# Patient Record
Sex: Female | Born: 2000 | Hispanic: No | Marital: Single | Smoking: Never smoker
Health system: Southern US, Community
[De-identification: ages and names within clinical notes are randomized; demographics above are authoritative.]

## PROBLEM LIST (undated history)

## (undated) DIAGNOSIS — F329 Major depressive disorder, single episode, unspecified: Secondary | ICD-10-CM

## (undated) DIAGNOSIS — G43909 Migraine, unspecified, not intractable, without status migrainosus: Secondary | ICD-10-CM

## (undated) DIAGNOSIS — K589 Irritable bowel syndrome without diarrhea: Secondary | ICD-10-CM

## (undated) DIAGNOSIS — F32A Depression, unspecified: Secondary | ICD-10-CM

## (undated) DIAGNOSIS — F419 Anxiety disorder, unspecified: Secondary | ICD-10-CM

## (undated) DIAGNOSIS — J45909 Unspecified asthma, uncomplicated: Secondary | ICD-10-CM

## (undated) DIAGNOSIS — K219 Gastro-esophageal reflux disease without esophagitis: Secondary | ICD-10-CM

## (undated) HISTORY — PX: BONE MARROW BIOPSY: SHX199

---
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
## 2019-05-17 ENCOUNTER — Other Ambulatory Visit: Payer: Self-pay

## 2019-05-17 ENCOUNTER — Ambulatory Visit (HOSPITAL_COMMUNITY)
Admission: EM | Admit: 2019-05-17 | Discharge: 2019-05-17 | Disposition: A | Discharge status: Home / Self Care | Payer: Medicaid Other | Attending: Family Medicine | Admitting: Family Medicine

## 2019-05-17 ENCOUNTER — Encounter (HOSPITAL_COMMUNITY): Payer: Self-pay

## 2019-05-17 DIAGNOSIS — Z20828 Contact with and (suspected) exposure to other viral communicable diseases: Secondary | ICD-10-CM | POA: Diagnosis not present

## 2019-05-17 DIAGNOSIS — J029 Acute pharyngitis, unspecified: Secondary | ICD-10-CM | POA: Diagnosis present

## 2019-05-17 LAB — POCT RAPID STREP A: Streptococcus, Group A Screen (Direct): NEGATIVE

## 2019-05-17 MED ORDER — AMOXICILLIN 500 MG PO CAPS: 500.0000 mg | ORAL_CAPSULE | Freq: Two times a day (BID) | ORAL | 0 refills | Status: DC

## 2019-05-17 NOTE — Discharge Instructions (Signed)
If you start taking the antibiotics and develop a fever then it's likely you have mono. Please stop the antibiotic if that occurs.  Please alternate ibuprofen and Tylenol for fever. Please stay well-hydrated. We will call you with the results from today.

## 2019-05-17 NOTE — ED Triage Notes (Signed)
Pt presents with complaints of headache fever, sore throat and headache x 2 days.

## 2019-05-17 NOTE — ED Provider Notes (Signed)
[facility]    CSN: [id number] Arrival date & time: 05/17/19  1243      History   Chief Complaint Chief Complaint  Patient presents with  . Fever    HPI Alexandra Young is a 18 y.o. female.   She is presenting with fever, sore throat and headache.  She is symptoms started the past few days.  She measured her temperature to be 101 yesterday.  She has a history of strep pharyngitis.  She had COVID in June of this year.  She has not been around anyone with similar symptoms.  She has been in close proximity with other people.  Does not have any trouble swallowing.  Pain does get worse when she does swallow or speak.  Pain is becoming more constant nature.  HPI  History reviewed. No pertinent past medical history.  There are no active problems to display for this patient.   History reviewed. No pertinent surgical history.  OB History   No obstetric history on file.      Home Medications    Prior to Admission medications   Medication Sig Start Date End Date Taking? Authorizing Provider  cholecalciferol (VITAMIN D3) 25 MCG (1000 UT) tablet Take 1,000 Units by mouth daily.   Yes [provider]  linaclotide (LINZESS) 145 MCG CAPS capsule Take 145 mcg by mouth daily before breakfast.   Yes [provider]  polyethylene glycol (MIRALAX / GLYCOLAX) 17 g packet Take 17 g by mouth daily.   Yes [provider]  amoxicillin (AMOXIL) 500 MG capsule Take 1 capsule (500 mg total) by mouth 2 (two) times daily. 05/17/19   Myra RudeSchmitz, Jehu Mccauslin E, MD  cetirizine (ZYRTEC) 10 MG tablet Take 10 mg by mouth daily.    [provider]  cyclobenzaprine (FLEXERIL) 5 MG tablet Take 1 tablet (5 mg total) by mouth at bedtime. 10/29/18   Elvina SidleLauenstein, Kurt, MD  medroxyPROGESTERone (DEPO-PROVERA) 150 MG/ML injection Inject 150 mg into the muscle every 3 (three) months.    [provider]  naproxen (NAPROSYN) 500 MG tablet Take 1 tablet (500 mg total) by mouth 2  (two) times daily. 10/24/18   Carlyle BasquesHernandez, Ana P, PA-C  omeprazole (PRILOSEC) 10 MG capsule Take 10 mg by mouth daily.    [provider]  predniSONE (DELTASONE) 20 MG tablet Two daily with food 10/29/18   Elvina SidleLauenstein, Kurt, MD    Family History Family History  Problem Relation Age of Onset  . Asthma Mother   . Thyroid disease Mother     Social History Social History   Tobacco Use  . Smoking status: Never Smoker  . Smokeless tobacco: Never Used  Substance Use Topics  . Alcohol use: Never    Frequency: Never  . Drug use: Never     Allergies   Patient has no known allergies.   Review of Systems Review of Systems  Constitutional: Positive for fever.  HENT: Positive for sore throat.   Respiratory: Negative for cough.   Cardiovascular: Negative for chest pain.  Gastrointestinal: Negative for abdominal pain.  Musculoskeletal: Negative for back pain.  Skin: Negative for color change.  Neurological: Negative for weakness.  Hematological: Negative for adenopathy.  Psychiatric/Behavioral: Negative for agitation.     Physical Exam Triage Vital Signs ED Triage Vitals  Enc Vitals Group     BP 05/17/19 1314 122/70     Pulse Rate 05/17/19 1314 (!) 145     Resp 05/17/19 1314 20     Temp  05/17/19 1314 100 F (37.8 C)     Temp Source 05/17/19 1314 Tympanic     SpO2 05/17/19 1314 98 %     Weight --      Height --      Head Circumference --      Peak Flow --      Pain Score 05/17/19 1315 8     Pain Loc --      Pain Edu? --      Excl. in [place]? --    No data found.  Updated Vital Signs BP 122/70   Pulse (!) 130   Temp 100 F (37.8 C) (Tympanic)   Resp 20   SpO2 98%   Visual Acuity Right Eye Distance:   Left Eye Distance:   Bilateral Distance:    Right Eye Near:   Left Eye Near:    Bilateral Near:     Physical Exam Gen: NAD, alert, cooperative with exam, ENT: normal lips, normal nasal mucosa, tympanic membranes clear and intact bilaterally, enlarged  tonsils bilaterally with exudates. Eye: normal EOM, normal conjunctiva and lids CV:  no edema, +2 pedal pulses, tachycardic, regular rhythm, S1-S2   Resp: no accessory muscle use, non-labored, clear to auscultation bilaterally, no crackles or wheezes Skin: no rashes, no areas of induration  Neuro: normal tone, normal sensation to touch Psych:  normal insight, alert and oriented MSK: Normal gait, normal strength    UC Treatments / Results  Labs (all labs ordered are listed, but only abnormal results are displayed) Labs Reviewed  NOVEL CORONAVIRUS, NAA (HOSP ORDER, SEND-OUT TO REF LAB; TAT 18-24 HRS)  CULTURE, GROUP A STREP [facility])  POCT RAPID STREP A    EKG   Radiology No results found.  Procedures Procedures (including critical care time)  Medications Ordered in UC Medications - No data to display  Initial Impression / Assessment and Plan / UC Course  I have reviewed the triage vital signs and the nursing notes.  Pertinent labs & imaging results that were available during my care of the patient were reviewed by me and considered in my medical decision making (see chart for details).     Alexandra Young is a 18 year old female that is presenting with sore throat.  Has tonsillar enlargement and exudates on exam but rapid strep was negative.  Culture was sent.  Possible for mononucleosis.  Cover test was also sent.  She was positive in June.  Provide amoxicillin and counseled on its use if she develops a rash.  Provided a work note.  Counseled on supportive care.  Given indications to follow-up and return.  Final Clinical Impressions(s) / UC Diagnoses   Final diagnoses:  Sore throat     Discharge Instructions     If you start taking the antibiotics and develop a fever then it's likely you have mono. Please stop the antibiotic if that occurs.  Please alternate ibuprofen and Tylenol for fever. Please stay well-hydrated. We will call you with the results from today.    ED  Prescriptions    Medication Sig Dispense Auth. Provider   amoxicillin (AMOXIL) 500 MG capsule Take 1 capsule (500 mg total) by mouth 2 (two) times daily. 20 capsule Rosemarie Ax, MD     Controlled Substance Prescriptions [place] Controlled Substance Registry consulted? Not Applicable   Rosemarie Ax, MD 05/17/19 1435

## 2019-05-18 LAB — CULTURE, GROUP A STREP (THRC)

## 2019-05-18 LAB — NOVEL CORONAVIRUS, NAA (HOSP ORDER, SEND-OUT TO REF LAB; TAT 18-24 HRS): SARS-CoV-2, NAA: NOT DETECTED

## 2019-05-20 ENCOUNTER — Encounter (HOSPITAL_COMMUNITY): Payer: Self-pay

## 2019-06-20 ENCOUNTER — Other Ambulatory Visit: Payer: Self-pay

## 2019-06-20 ENCOUNTER — Encounter (HOSPITAL_COMMUNITY): Payer: Self-pay

## 2019-06-20 ENCOUNTER — Ambulatory Visit (HOSPITAL_COMMUNITY)
Admission: EM | Admit: 2019-06-20 | Discharge: 2019-06-20 | Disposition: A | Discharge status: Home / Self Care | Payer: Medicaid Other | Attending: Family Medicine | Admitting: Family Medicine

## 2019-06-20 DIAGNOSIS — G43819 Other migraine, intractable, without status migrainosus: Secondary | ICD-10-CM

## 2019-06-20 MED ORDER — KETOROLAC TROMETHAMINE 60 MG/2ML IM SOLN
INTRAMUSCULAR | Status: AC
  Filled 2019-06-20: qty 2

## 2019-06-20 MED ORDER — METOCLOPRAMIDE HCL 5 MG/ML IJ SOLN
5.0000 mg | Freq: Once | INTRAMUSCULAR | Status: AC
  Administered 2019-06-20: 17:00:00 5 mg via INTRAMUSCULAR

## 2019-06-20 MED ORDER — DEXAMETHASONE SODIUM PHOSPHATE 10 MG/ML IJ SOLN
10.0000 mg | Freq: Once | INTRAMUSCULAR | Status: AC
  Administered 2019-06-20: 17:00:00 10 mg via INTRAMUSCULAR

## 2019-06-20 MED ORDER — METOCLOPRAMIDE HCL 5 MG/ML IJ SOLN
INTRAMUSCULAR | Status: AC
  Filled 2019-06-20: qty 2

## 2019-06-20 MED ORDER — KETOROLAC TROMETHAMINE 60 MG/2ML IM SOLN
60.0000 mg | Freq: Once | INTRAMUSCULAR | Status: AC
  Administered 2019-06-20: 17:00:00 60 mg via INTRAMUSCULAR

## 2019-06-20 MED ORDER — DEXAMETHASONE SODIUM PHOSPHATE 10 MG/ML IJ SOLN
INTRAMUSCULAR | Status: AC
  Filled 2019-06-20: qty 1

## 2019-06-20 NOTE — ED Provider Notes (Signed)
[facility]   [id number] 06/20/19 Arrival Time: 1557  ASSESSMENT & PLAN:  1. Other migraine without status migrainosus, intractable     Given: Meds ordered this encounter  Medications  . ketorolac (TORADOL) injection 60 mg  . metoCLOPramide (REGLAN) injection 5 mg  . dexamethasone (DECADRON) injection 10 mg    Normal neurological exam. Afebrile without nuchal rigidity. Current presentation and symptoms are consistent with prior migraines. No indication for neurodiagnostic workup at this time. Discussed.  Recommend: Follow-up Information    [facility].   Specialty: Urgent Care Why: If worsening or failing to improve as anticipated. Contact information: [address] [place] [address] [phone]          Reviewed expectations re: course of current medical issues. Questions answered. Outlined signs and symptoms indicating need for more acute intervention. Patient verbalized understanding. After Visit Summary given.   SUBJECTIVE:  Cristie Mckinney is a 18 y.o. female who presents with complaint of a migraine headache. Onset gradual, yesterday. Location: occipital that radiates frontally. History of headaches: yes with the same symptoms. Precipitating factors include: none which have been determined. Associated symptoms: Preceding aura: no. Nausea/vomiting: no. Vision changes: no. Increased sensitivity to light and to noises: mild. Fever: no. Sinus pressure/congestion: no. Extremity weakness: no. Home treatment has included Duexis with some improvement. Current headache has imited normal daily activities; called out of work today. Denies dizziness, loss of balance, muscle weakness, numbness of extremities, speech difficulties and vision problems. No head injury reported. Ambulatory without difficulty. No recent travel. No head injury reported.  ROS: As per HPI. All other systems negative.     OBJECTIVE:  Vitals:   06/20/19 1611  BP: 117/68  Pulse: 85  Resp: 15  Temp: 98.7 F (37.1 C)  TempSrc: Oral  SpO2: 99%    General appearance: alert; NAD but appears fatigued HENT: normocephalic; atraumatic Eyes: PERRLA; EOMI; conjunctivae normal Neck: supple with FROM Lungs: clear to auscultation bilaterally; unlabored respirations Heart: regular rate and rhythm Extremities: no edema; symmetrical with no gross deformities Skin: warm and dry Neurologic: alert; clear speech; CN 2-12 grossly intact; no facial droop; normal gait; normal symmetric reflexes; normal extremity strength and sensation throughout; bilateral upper and lower extremity sensation is grossly intact with 5/5 symmetric strength; normal grip strength Psychological: alert and cooperative; normal mood and affect  No Known Allergies   PMH: Sinusitis. Migraines.  Social History   Socioeconomic History  . Marital status: Single    Spouse name: Not on file  . Number of children: Not on file  . Years of education: Not on file  . Highest education level: Not on file  Occupational History  . Not on file  Social Needs  . Financial resource strain: Not on file  . Food insecurity    Worry: Not on file    Inability: Not on file  . Transportation needs    Medical: Not on file    Non-medical: Not on file  Tobacco Use  . Smoking status: Never Smoker  . Smokeless tobacco: Never Used  Substance and Sexual Activity  . Alcohol use: Never    Frequency: Never  . Drug use: Never  . Sexual activity: Yes    Birth control/protection: Condom  Lifestyle  . Physical activity    Days per week: Not on file    Minutes per session: Not on file  . Stress: Not on file  Relationships  . Social connections  Talks on phone: Not on file    Gets together: Not on file    Attends religious service: Not on file    Active member of club or organization: Not on file    Attends meetings of clubs or organizations: Not on file     Relationship status: Not on file  . Intimate partner violence    Fear of current or ex partner: Not on file    Emotionally abused: Not on file    Physically abused: Not on file    Forced sexual activity: Not on file  Other Topics Concern  . Not on file  Social History Narrative  . Not on file   Family History  Problem Relation Age of Onset  . Asthma Mother   . Thyroid disease Mother    History reviewed. No pertinent surgical history.   Vanessa Kick, MD 06/20/19 [phone]

## 2019-06-20 NOTE — ED Triage Notes (Signed)
Pt reports nausea and migraine x 1 day. Pt took  Duexis and improved her symptoms.

## 2019-06-20 NOTE — Discharge Instructions (Signed)
Meds ordered this encounter  Medications   ketorolac (TORADOL) injection 60 mg   metoCLOPramide (REGLAN) injection 5 mg   dexamethasone (DECADRON) injection 10 mg
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
## 2019-09-14 ENCOUNTER — Encounter (HOSPITAL_COMMUNITY): Payer: Self-pay

## 2019-09-14 ENCOUNTER — Other Ambulatory Visit: Payer: Self-pay

## 2019-09-14 ENCOUNTER — Ambulatory Visit (HOSPITAL_COMMUNITY)
Admission: EM | Admit: 2019-09-14 | Discharge: 2019-09-14 | Disposition: A | Discharge status: Home / Self Care | Payer: Medicaid Other | Attending: Family Medicine | Admitting: Family Medicine

## 2019-09-14 DIAGNOSIS — Z20822 Contact with and (suspected) exposure to covid-19: Secondary | ICD-10-CM

## 2019-09-14 DIAGNOSIS — R197 Diarrhea, unspecified: Secondary | ICD-10-CM | POA: Diagnosis present

## 2019-09-14 DIAGNOSIS — R112 Nausea with vomiting, unspecified: Secondary | ICD-10-CM | POA: Diagnosis present

## 2019-09-14 DIAGNOSIS — J069 Acute upper respiratory infection, unspecified: Secondary | ICD-10-CM | POA: Diagnosis present

## 2019-09-14 MED ORDER — DM-GUAIFENESIN ER 30-600 MG PO TB12: 1.0000 | ORAL_TABLET | Freq: Two times a day (BID) | ORAL | 0 refills | Status: DC

## 2019-09-14 MED ORDER — ONDANSETRON 4 MG PO TBDP: 4.0000 mg | ORAL_TABLET | Freq: Three times a day (TID) | ORAL | 0 refills | Status: DC | PRN

## 2019-09-14 MED ORDER — ALUM & MAG HYDROXIDE-SIMETH 400-400-40 MG/5ML PO SUSP: 10.0000 mL | Freq: Four times a day (QID) | ORAL | 0 refills | Status: DC | PRN

## 2019-09-14 NOTE — ED Triage Notes (Signed)
Patient presents to Urgent Care with complaints of diarrhea and vomiting since 09/11/2019. Patient reports she had a covid exposure at work the previous week. Pt states she had covid in July and so wanted to be tested again, was seen at [facility] and got a rapid test that was negative but she questions the reliability of it, would like the PCR test. Pt's cousin/roommate also tested positive for covid yesterday.

## 2019-09-14 NOTE — Discharge Instructions (Signed)
Covid swab pending, monitor my chart for results, recommending quarantining for 10 days from symptom onset given your close exposure Please rest and drink plenty of fluids Diarrhea should gradually resolve, may take Imodium or Pepto-Bismol bowels extremely frequent Continue your daily omeprazole, may supplement with Maalox as needed for discomfort Zofran as needed for nausea, dissolves underneath tongue Avoid spicy and greasy food temporarily You may restart your allergy medicines if you plan on rescheduling your allergy testing, which I recommend delaying 1 to 2 weeks. May try Mucinex DM for congestion/cough in the meantime  Please continue to monitor symptoms, follow-up if developing worsening abdominal pain, difficulty breathing, shortness of breath or chest pain

## 2019-09-16 NOTE — ED Provider Notes (Signed)
[facility]   [id number] 09/14/19 Arrival Time: 1807  ASSESSMENT & PLAN:  1. Viral URI with cough   2. Nausea vomiting and diarrhea   3. Exposure to COVID-19 virus      COVID-19 testing sent. To self-quarantine until results are available. If requested, work note provided.  Tolerating PO fluids. No s/s of dehydration requiring IVF at this time.   Reviewed expectations re: course of current medical issues. Questions answered. Outlined signs and symptoms indicating need for more acute intervention. Patient verbalized understanding. After Visit Summary given.   SUBJECTIVE: History from: patient. Alexandra Young is a 19 y.o. female who requests COVID-19 testing. Known COVID-19 contact: cousin and roommate who tested + recently. Recent travel: none. Denies: runny nose, congestion, fever, cough, sore throat, difficulty breathing and headache. Does reports mild emesis and diarrhea on 09/11/19; now better. No abd pain. No urinary symptoms. Normal PO intake without n/v/d.  ROS: As per HPI.   OBJECTIVE:  Vitals:   09/14/19 1914  BP: 114/72  Pulse: 87  Resp: 16  Temp: 98.9 F (37.2 C)  TempSrc: Oral  SpO2: 100%    General appearance: alert; no distress Eyes: PERRLA; EOMI; conjunctiva normal HENT: Jo[place]; AT; nasal mucosa normal; oral mucosa normal Neck: supple  Lungs: speaks full sentences without difficulty; unlabored Heart: regular rate and rhythm Abdomen: soft, non-tender Extremities: no edema Skin: warm and dry Neurologic: normal gait Psychological: alert and cooperative; normal mood and affect  Labs:  Labs Reviewed  NOVEL CORONAVIRUS, NAA (HOSP ORDER, SEND-OUT TO REF LAB; TAT 18-24 HRS)     No Known Allergies   Social History   Socioeconomic History  . Marital status: Single    Spouse name: Not on file  . Number of children: Not on file  . Years of education: Not on file  . Highest education level: Not on file  Occupational History  . Not on file   Tobacco Use  . Smoking status: Never Smoker  . Smokeless tobacco: Never Used  Substance and Sexual Activity  . Alcohol use: Never  . Drug use: Never  . Sexual activity: Yes    Birth control/protection: Condom  Other Topics Concern  . Not on file  Social History Narrative  . Not on file   Social Determinants of Health   Financial Resource Strain:   . Difficulty of Paying Living Expenses: Not on file  Food Insecurity:   . Worried About Programme researcher, broadcasting/film/video in the Last Year: Not on file  . Ran Out of Food in the Last Year: Not on file  Transportation Needs:   . Lack of Transportation (Medical): Not on file  . Lack of Transportation (Non-Medical): Not on file  Physical Activity:   . Days of Exercise per Week: Not on file  . Minutes of Exercise per Session: Not on file  Stress:   . Feeling of Stress : Not on file  Social Connections:   . Frequency of Communication with Friends and Family: Not on file  . Frequency of Social Gatherings with Friends and Family: Not on file  . Attends Religious Services: Not on file  . Active Member of Clubs or Organizations: Not on file  . Attends Banker Meetings: Not on file  . Marital Status: Not on file  Intimate Partner Violence:   . Fear of Current or Ex-Partner: Not on file  . Emotionally Abused: Not on file  . Physically Abused: Not on file  . Sexually Abused: Not  on file   Family History  Problem Relation Age of Onset  . Asthma Mother   . Thyroid disease Mother    History reviewed. No pertinent surgical history.   Vanessa Kick, MD 09/16/19 [phone]

## 2019-09-17 LAB — NOVEL CORONAVIRUS, NAA (HOSP ORDER, SEND-OUT TO REF LAB; TAT 18-24 HRS): SARS-CoV-2, NAA: NOT DETECTED

## 2019-10-28 ENCOUNTER — Ambulatory Visit (HOSPITAL_COMMUNITY)
Admission: EM | Admit: 2019-10-28 | Discharge: 2019-10-28 | Disposition: A | Discharge status: Home / Self Care | Payer: Medicaid Other | Attending: Family Medicine | Admitting: Family Medicine

## 2019-10-28 ENCOUNTER — Encounter (HOSPITAL_COMMUNITY): Payer: Self-pay | Admitting: Emergency Medicine

## 2019-10-28 ENCOUNTER — Other Ambulatory Visit: Payer: Self-pay

## 2019-10-28 DIAGNOSIS — R21 Rash and other nonspecific skin eruption: Secondary | ICD-10-CM

## 2019-10-28 HISTORY — DX: Unspecified asthma, uncomplicated: J45.909

## 2019-10-28 HISTORY — DX: Anxiety disorder, unspecified: F41.9

## 2019-10-28 HISTORY — DX: Depression, unspecified: F32.A

## 2019-10-28 HISTORY — DX: Irritable bowel syndrome, unspecified: K58.9

## 2019-10-28 MED ORDER — TRIAMCINOLONE ACETONIDE 0.1 % EX CREA: 1.0000 "application " | TOPICAL_CREAM | Freq: Two times a day (BID) | CUTANEOUS | 0 refills | Status: DC

## 2019-10-28 MED ORDER — PERMETHRIN 5 % EX CREA: TOPICAL_CREAM | CUTANEOUS | 1 refills | Status: DC

## 2019-10-28 NOTE — Discharge Instructions (Signed)
Try using the steroid cream 2 times a day to see if this helps.  If the problem continues or worsen let us know.  Keep taking the zyrtec. You can also use benadryl.  Follow up as needed for continued or worsening symptoms

## 2019-10-28 NOTE — ED Triage Notes (Signed)
Pt here for rash under right breast and shoulder that is itching

## 2019-10-29 NOTE — ED Provider Notes (Signed)
[place]    CSN: [id number] Arrival date & time: 10/28/19  1347      History   Chief Complaint Chief Complaint  Patient presents with  . Rash    HPI Alexandra Young is a 19 y.o. female.   Patient is a 89 female presents today for rash under right breast and abdominal area and right upper back.  Symptoms been constant since this morning.  She takes daily Zyrtec for allergies and this seems to not be helping the itching much.  Denies any history of same.  Denies any fever, joint pain. Denies any recent changes in lotions, detergents, foods or other possible irritants. No recent travel. Nobody else at home has the rash. Patient has been outside but denies any contact with plants or insects. No new foods or medications.   ROS per HPI      Past Medical History:  Diagnosis Date  . Anxiety   . Asthma   . Depression   . IBS (irritable bowel syndrome)     There are no problems to display for this patient.   History reviewed. No pertinent surgical history.  OB History   No obstetric history on file.      Home Medications    Prior to Admission medications   Medication Sig Start Date End Date Taking? Authorizing Provider  alum & mag hydroxide-simeth (MAALOX MULTI SYMPTOM MAX ST) 400-400-40 MG/5ML suspension Take 10 mLs by mouth every 6 (six) hours as needed for indigestion. 09/14/19   Wieters, Hallie C, PA-C  cetirizine (ZYRTEC) 10 MG tablet Take 10 mg by mouth daily.    [provider]  cholecalciferol (VITAMIN D3) 25 MCG (1000 UT) tablet Take 1,000 Units by mouth daily.    [provider]  dextromethorphan-guaiFENesin (MUCINEX DM) 30-600 MG 12hr tablet Take 1 tablet by mouth 2 (two) times daily. 09/14/19   Wieters, Hallie C, PA-C  linaclotide (LINZESS) 145 MCG CAPS capsule Take 145 mcg by mouth daily before breakfast.    [provider]  medroxyPROGESTERone (DEPO-PROVERA) 150 MG/ML injection Inject 150 mg into the muscle every 3 (three)  months.    [provider]  naproxen (NAPROSYN) 500 MG tablet Take 1 tablet (500 mg total) by mouth 2 (two) times daily. 10/24/18   Darlin Drop P, PA-C  omeprazole (PRILOSEC) 10 MG capsule Take 10 mg by mouth daily.    [provider]  ondansetron (ZOFRAN ODT) 4 MG disintegrating tablet Take 1 tablet (4 mg total) by mouth every 8 (eight) hours as needed for nausea or vomiting. 09/14/19   Wieters, Hallie C, PA-C  permethrin (ELIMITE) 5 % cream Apply head to toe and leave on for 12 hours and then wash off. Repeat in 2 weeks if needed. 60 10/28/19   Aliciana Ricciardi A, NP  polyethylene glycol (MIRALAX / GLYCOLAX) 17 g packet Take 17 g by mouth daily.    [provider]  triamcinolone cream (KENALOG) 0.1 % Apply 1 application topically 2 (two) times daily. 10/28/19   Orvan July, NP    Family History Family History  Problem Relation Age of Onset  . Asthma Mother   . Thyroid disease Mother     Social History Social History   Tobacco Use  . Smoking status: Never Smoker  . Smokeless tobacco: Never Used  Substance Use Topics  . Alcohol use: Never  . Drug use: Never     Allergies   Patient has no known allergies.   Review of  Systems Review of Systems   Physical Exam Triage Vital Signs ED Triage Vitals  Enc Vitals Group     BP 10/28/19 1418 119/74     Pulse Rate 10/28/19 1418 82     Resp 10/28/19 1418 18     Temp 10/28/19 1418 98.8 F (37.1 C)     Temp Source 10/28/19 1418 Oral     SpO2 10/28/19 1418 97 %     Weight --      Height --      Head Circumference --      Peak Flow --      Pain Score 10/28/19 1419 3     Pain Loc --      Pain Edu? --      Excl. in GC? --    No data found.  Updated Vital Signs BP 119/74 (BP Location: Left Arm)   Pulse 82   Temp 98.8 F (37.1 C) (Oral)   Resp 18   SpO2 97%   Visual Acuity Right Eye Distance:   Left Eye Distance:   Bilateral Distance:    Right Eye Near:   Left Eye Near:    Bilateral Near:      Physical Exam Vitals and nursing note reviewed.  Constitutional:      General: She is not in acute distress.    Appearance: Normal appearance. She is not ill-appearing, toxic-appearing or diaphoretic.  HENT:     Head: Normocephalic.     Nose: Nose normal.  Eyes:     Conjunctiva/sclera: Conjunctivae normal.  Pulmonary:     Effort: Pulmonary effort is normal.  Musculoskeletal:        General: Normal range of motion.     Cervical back: Normal range of motion.  Skin:    General: Skin is warm and dry.     Findings: Rash present.  Neurological:     Mental Status: She is alert.  Psychiatric:        Mood and Affect: Mood normal.      UC Treatments / Results  Labs (all labs ordered are listed, but only abnormal results are displayed) Labs Reviewed - No data to display  EKG   Radiology No results found.  Procedures Procedures (including critical care time)  Medications Ordered in UC Medications - No data to display  Initial Impression / Assessment and Plan / UC Course  I have reviewed the triage vital signs and the nursing notes.  Pertinent labs & imaging results that were available during my care of the patient were reviewed by me and considered in my medical decision making (see chart for details).     Rash-most likely some sort of allergic reaction or dermatitis. Treating with triamcinolone cream.  Recommended if this does not help over the next couple days and the rash spreads or worsens we will try to treat for scabies at that time. Sent in prescriptions for both.  Follow up as needed for continued or worsening symptoms  Final Clinical Impressions(s) / UC Diagnoses   Final diagnoses:  Rash and nonspecific skin eruption     Discharge Instructions     Try using the steroid cream 2 times a day to see if this helps.  If the problem continues or worsen let us know.  Keep taking the zyrtec. You can also use benadryl.  Follow up as needed for continued or  worsening symptoms     ED Prescriptions    Medication Sig Dispense Auth. Provider   permethrin (  ELIMITE) 5 % cream Apply head to toe and leave on for 12 hours and then wash off. Repeat in 2 weeks if needed. 60 60 g Gwenivere Hiraldo A, NP   triamcinolone cream (KENALOG) 0.1 % Apply 1 application topically 2 (two) times daily. 30 g Dahlia Byes A, NP     PDMP not reviewed this encounter.   Dahlia Byes A, NP 10/29/19 [phone]

## 2019-11-18 ENCOUNTER — Telehealth: Payer: Medicaid Other | Admitting: Nurse Practitioner

## 2019-11-18 DIAGNOSIS — A09 Infectious gastroenteritis and colitis, unspecified: Secondary | ICD-10-CM | POA: Diagnosis not present

## 2019-11-18 MED ORDER — ONDANSETRON HCL 4 MG PO TABS: 4.0000 mg | ORAL_TABLET | Freq: Three times a day (TID) | ORAL | 0 refills | Status: DC | PRN

## 2019-11-18 MED ORDER — AZITHROMYCIN 500 MG PO TABS: 500.0000 mg | ORAL_TABLET | Freq: Every day | ORAL | 0 refills | Status: DC

## 2019-11-18 NOTE — Progress Notes (Signed)
We are sorry that you are not feeling well.  Here is how we plan to help!  Based on what you have shared with me it looks like you have Acute Infectious Diarrhea.  Most cases of acute diarrhea are due to infections with virus and bacteria and are self-limited conditions lasting less than 14 days.  For your symptoms you may take Imodium 2 mg tablets that are over the counter at your local pharmacy. Take two tablet now and then one after each loose stool up to 6 a day.  Antibiotics are not needed for most people with diarrhea.  Optional: Zofran 4 mg 1 tablet every 8 hours as needed for nausea and vomiting  Optional: I have prescribed azithromycin 500 mg daily for 3 days  HOME CARE  We recommend changing your diet to help with your symptoms for the next few days.  Drink plenty of fluids that contain water salt and sugar. Sports drinks such as Gatorade may help.   You may try broths, soups, bananas, applesauce, soft breads, mashed potatoes or crackers.   You are considered infectious for as long as the diarrhea continues. Hand washing or use of alcohol based hand sanitizers is recommend.  It is best to stay out of work or school until your symptoms stop.   GET HELP RIGHT AWAY  If you have dark yellow colored urine or do not pass urine frequently you should drink more fluids.    If your symptoms worsen   If you feel like you are going to pass out (faint)  You have a new problem  MAKE SURE YOU   Understand these instructions.  Will watch your condition.  Will get help right away if you are not doing well or get worse.  Your e-visit answers were reviewed by a board certified advanced clinical practitioner to complete your personal care plan.  Depending on the condition, your plan could have included both over the counter or prescription medications.  If there is a problem please reply  once you have received a response from your provider.  Your safety is important to [place].  If  you have drug allergies check your prescription carefully.    You can use MyChart to ask questions about today's visit, request a non-urgent call back, or ask for a work or school excuse for 24 hours related to this e-Visit. If it has been greater than 24 hours you will need to follow up with your provider, or enter a new e-Visit to address those concerns.   You will get an e-mail in the next two days asking about your experience.  I hope that your e-visit has been valuable and will speed your recovery. Thank you for using e-visits.  5-10 minutes spent reviewing and documenting in chart.

## 2020-03-15 ENCOUNTER — Other Ambulatory Visit: Payer: Self-pay

## 2020-03-15 ENCOUNTER — Ambulatory Visit (HOSPITAL_COMMUNITY)
Admission: EM | Admit: 2020-03-15 | Discharge: 2020-03-15 | Disposition: A | Discharge status: Home / Self Care | Payer: Medicaid Other | Attending: Physician Assistant | Admitting: Physician Assistant

## 2020-03-15 DIAGNOSIS — R110 Nausea: Secondary | ICD-10-CM | POA: Insufficient documentation

## 2020-03-15 DIAGNOSIS — Z793 Long term (current) use of hormonal contraceptives: Secondary | ICD-10-CM | POA: Diagnosis not present

## 2020-03-15 DIAGNOSIS — K589 Irritable bowel syndrome without diarrhea: Secondary | ICD-10-CM | POA: Insufficient documentation

## 2020-03-15 DIAGNOSIS — J45909 Unspecified asthma, uncomplicated: Secondary | ICD-10-CM | POA: Diagnosis not present

## 2020-03-15 DIAGNOSIS — J020 Streptococcal pharyngitis: Secondary | ICD-10-CM | POA: Diagnosis not present

## 2020-03-15 DIAGNOSIS — J029 Acute pharyngitis, unspecified: Secondary | ICD-10-CM | POA: Diagnosis not present

## 2020-03-15 DIAGNOSIS — Z3202 Encounter for pregnancy test, result negative: Secondary | ICD-10-CM | POA: Diagnosis not present

## 2020-03-15 DIAGNOSIS — Z20822 Contact with and (suspected) exposure to covid-19: Secondary | ICD-10-CM | POA: Diagnosis not present

## 2020-03-15 DIAGNOSIS — Z79899 Other long term (current) drug therapy: Secondary | ICD-10-CM | POA: Insufficient documentation

## 2020-03-15 DIAGNOSIS — Z791 Long term (current) use of non-steroidal anti-inflammatories (NSAID): Secondary | ICD-10-CM | POA: Diagnosis not present

## 2020-03-15 LAB — POC URINE PREG, ED: Preg Test, Ur: NEGATIVE

## 2020-03-15 LAB — POCT RAPID STREP A: Streptococcus, Group A Screen (Direct): POSITIVE — AB

## 2020-03-15 MED ORDER — CEPACOL SORE THROAT 5.4 MG MT LOZG
1.0000 | LOZENGE | OROMUCOSAL | 0 refills | Status: DC | PRN
Start: 2020-03-15 — End: 2020-08-08

## 2020-03-15 MED ORDER — AMOXICILLIN 500 MG PO CAPS
500.0000 mg | ORAL_CAPSULE | Freq: Two times a day (BID) | ORAL | 0 refills | Status: AC
Start: 2020-03-15 — End: 2020-03-25

## 2020-03-15 NOTE — ED Provider Notes (Signed)
[facility]    CSN: [id number] Arrival date & time: 03/15/20  1730      History   Chief Complaint Chief Complaint  Patient presents with  . Sore Throat    HPI Alexandra Young is a 19 y.o. female.   Patient reports for 2-day history of sore throat.  She reports yesterday she woke with a scratchy throat however this is gradually worsened to severe pain today.  She reports pain with swallowing.  Denies difficulty swallowing but only pain.  She does report feeling like she has swollen glands.  Denies fever or chills.  She reports some nausea throughout the day.  She reports this is not abnormal for her as she has IBS.  Denies vomiting.  Denies cough, headache.  She does report chronic nasal congestion that is not acutely worse.  She denies any diarrhea.  Denies any sick contacts.  She reports she has irregular menstrual cycles and has been doing with her OB/GYN for this.  She is unsure when her last true menstrual cycle was.  She reports she is sexually active.     Past Medical History:  Diagnosis Date  . Anxiety   . Asthma   . Depression   . IBS (irritable bowel syndrome)     There are no problems to display for this patient.   No past surgical history on file.  OB History   No obstetric history on file.      Home Medications    Prior to Admission medications   Medication Sig Start Date End Date Taking? Authorizing Provider  butalbital-acetaminophen-caffeine (FIORICET) 50-325-40 MG tablet Take by mouth. 07/02/19  Yes [provider]  cetirizine (ZYRTEC) 10 MG tablet Take 10 mg by mouth daily.   Yes [provider]  montelukast (SINGULAIR) 10 MG tablet Take by mouth. 11/06/19  Yes [provider]  omeprazole (PRILOSEC) 10 MG capsule Take 10 mg by mouth daily.   Yes [provider]  traMADol (ULTRAM) 50 MG tablet Take by mouth every 6 (six) hours as needed.   Yes [provider]  alum & mag hydroxide-simeth (MAALOX  MULTI SYMPTOM MAX ST) 400-400-40 MG/5ML suspension Take 10 mLs by mouth every 6 (six) hours as needed for indigestion. 09/14/19   Wieters, Hallie C, PA-C  amoxicillin (AMOXIL) 500 MG capsule Take 1 capsule (500 mg total) by mouth 2 (two) times daily for 10 days. 03/15/20 03/25/20  Loretta Doutt, Veryl Speak, PA-C  azithromycin (ZITHROMAX) 500 MG tablet Take 1 tablet (500 mg total) by mouth daily. 11/18/19   Daphine Deutscher Mary-Margaret, FNP  cholecalciferol (VITAMIN D3) 25 MCG (1000 UT) tablet Take 1,000 Units by mouth daily.    [provider]  dextromethorphan-guaiFENesin (MUCINEX DM) 30-600 MG 12hr tablet Take 1 tablet by mouth 2 (two) times daily. 09/14/19   Wieters, Hallie C, PA-C  linaclotide (LINZESS) 145 MCG CAPS capsule Take 145 mcg by mouth daily before breakfast.    [provider]  medroxyPROGESTERone (DEPO-PROVERA) 150 MG/ML injection Inject 150 mg into the muscle every 3 (three) months.    [provider]  Menthol (CEPACOL SORE THROAT) 5.4 MG LOZG Use as directed 1 lozenge (5.4 mg total) in the mouth or throat every 2 (two) hours as needed. 03/15/20   Mariselda Badalamenti, Veryl Speak, PA-C  naproxen (NAPROSYN) 500 MG tablet Take 1 tablet (500 mg total) by mouth 2 (two) times daily. 10/24/18   Carlyle Basques P, PA-C  ondansetron (ZOFRAN ODT) 4 MG disintegrating tablet Take 1 tablet (  4 mg total) by mouth every 8 (eight) hours as needed for nausea or vomiting. 09/14/19   Wieters, Hallie C, PA-C  ondansetron (ZOFRAN) 4 MG tablet Take 1 tablet (4 mg total) by mouth every 8 (eight) hours as needed for nausea or vomiting. 11/18/19   Daphine Deutscher, Mary-Margaret, FNP  permethrin (ELIMITE) 5 % cream Apply head to toe and leave on for 12 hours and then wash off. Repeat in 2 weeks if needed. 60 10/28/19   Bast, Traci A, NP  polyethylene glycol (MIRALAX / GLYCOLAX) 17 g packet Take 17 g by mouth daily.    [provider]  triamcinolone cream (KENALOG) 0.1 % Apply 1 application topically 2 (two) times daily. 10/28/19   Janace Aris, NP    Family History Family History  Problem Relation Age of Onset  . Asthma Mother   . Thyroid disease Mother     Social History Social History   Tobacco Use  . Smoking status: Never Smoker  . Smokeless tobacco: Never Used  Vaping Use  . Vaping Use: Never used  Substance Use Topics  . Alcohol use: Never  . Drug use: Never     Allergies   Patient has no known allergies.   Review of Systems Review of Systems   Physical Exam Triage Vital Signs ED Triage Vitals  Enc Vitals Group     BP 03/15/20 1831 116/66     Pulse Rate 03/15/20 1831 98     Resp 03/15/20 1831 18     Temp 03/15/20 1831 98.7 F (37.1 C)     Temp Source 03/15/20 1831 Oral     SpO2 03/15/20 1831 100 %     Weight --      Height --      Head Circumference --      Peak Flow --      Pain Score 03/15/20 1827 8     Pain Loc --      Pain Edu? --      Excl. in GC? --    No data found.  Updated Vital Signs BP 116/66 (BP Location: Right Arm)   Pulse 98   Temp 98.7 F (37.1 C) (Oral)   Resp 18   SpO2 100%   Visual Acuity Right Eye Distance:   Left Eye Distance:   Bilateral Distance:    Right Eye Near:   Left Eye Near:    Bilateral Near:     Physical Exam Vitals and nursing note reviewed.  Constitutional:      General: She is not in acute distress.    Appearance: She is well-developed. She is not ill-appearing.  HENT:     Head: Normocephalic and atraumatic.     Right Ear: Tympanic membrane normal.     Left Ear: Tympanic membrane normal.     Mouth/Throat:     Mouth: Mucous membranes are moist. No oral lesions.     Pharynx: Uvula midline. Posterior oropharyngeal erythema present. No pharyngeal swelling or uvula swelling.     Tonsils: Tonsillar exudate present. No tonsillar abscesses. 2+ on the right. 2+ on the left.  Eyes:     Conjunctiva/sclera: Conjunctivae normal.  Cardiovascular:     Rate and Rhythm: Normal rate and regular rhythm.     Heart sounds: No murmur heard.    Pulmonary:     Effort: Pulmonary effort is normal. No respiratory distress.     Breath sounds: Normal breath sounds.  Abdominal:     Palpations: Abdomen  is soft.     Tenderness: There is no abdominal tenderness.  Musculoskeletal:     Cervical back: Neck supple.  Lymphadenopathy:     Cervical: Cervical adenopathy present.  Skin:    General: Skin is warm and dry.  Neurological:     Mental Status: She is alert.      UC Treatments / Results  Labs (all labs ordered are listed, but only abnormal results are displayed) Labs Reviewed  POCT RAPID STREP A - Abnormal; Notable for the following components:      Result Value   Streptococcus, Group A Screen (Direct) POSITIVE (*)    All other components within normal limits  SARS CORONAVIRUS 2 (TAT 6-24 HRS)  POC URINE PREG, ED    EKG   Radiology No results found.  Procedures Procedures (including critical care time)  Medications Ordered in UC Medications - No data to display  Initial Impression / Assessment and Plan / UC Course  I have reviewed the triage vital signs and the nursing notes.  Pertinent labs & imaging results that were available during my care of the patient were reviewed by me and considered in my medical decision making (see chart for details).     #Strep pharyngitis Patient is a 19 year old presenting with strep pharyngitis.  Rapid strep positive.  Placed on amoxicillin twice daily for 10 days.  Return follow-up precautions were discussed.  Patient verbalized understanding plan of care.   Final Clinical Impressions(s) / UC Diagnoses   Final diagnoses:  Strep pharyngitis     Discharge Instructions     Your strep was positive  Take the amoxicillin 2 times a day for 10 days  Use lozenges and tylenol for pain  If not significantly improving in 3 days return      ED Prescriptions    Medication Sig Dispense Auth. Provider   amoxicillin (AMOXIL) 500 MG capsule Take 1 capsule (500 mg total)  by mouth 2 (two) times daily for 10 days. 20 capsule Agron Swiney, Veryl Speak, PA-C   Menthol (CEPACOL SORE THROAT) 5.4 MG LOZG Use as directed 1 lozenge (5.4 mg total) in the mouth or throat every 2 (two) hours as needed. 30 lozenge Carisa Backhaus, Veryl Speak, PA-C     PDMP not reviewed this encounter.   Hermelinda Medicus, PA-C 03/15/20 2052

## 2020-03-15 NOTE — ED Triage Notes (Signed)
Pt c/o sore throat, nausea onset yesterday.  Denies fever, chills, abdom pain, cough, SOB.  States chronic congestion, runny nose 2/2 allergies.

## 2020-03-15 NOTE — ED Notes (Signed)
Tonsils edematous, erythemic, with exudate. Left more edematous than right.

## 2020-03-15 NOTE — Discharge Instructions (Signed)
Your strep was positive  Take the amoxicillin 2 times a day for 10 days  Use lozenges and tylenol for pain  If not significantly improving in 3 days return

## 2020-03-16 LAB — SARS CORONAVIRUS 2 (TAT 6-24 HRS): SARS Coronavirus 2: NEGATIVE

## 2020-05-10 ENCOUNTER — Encounter: Payer: Self-pay | Admitting: Physical Therapy

## 2020-05-10 ENCOUNTER — Ambulatory Visit: Payer: Medicaid Other | Attending: Orthopedic Surgery | Admitting: Physical Therapy

## 2020-05-10 ENCOUNTER — Other Ambulatory Visit: Payer: Self-pay

## 2020-05-10 DIAGNOSIS — M6281 Muscle weakness (generalized): Secondary | ICD-10-CM | POA: Insufficient documentation

## 2020-05-10 DIAGNOSIS — G8929 Other chronic pain: Secondary | ICD-10-CM | POA: Diagnosis present

## 2020-05-10 DIAGNOSIS — M25562 Pain in left knee: Secondary | ICD-10-CM | POA: Insufficient documentation

## 2020-05-10 DIAGNOSIS — M25561 Pain in right knee: Secondary | ICD-10-CM | POA: Insufficient documentation

## 2020-05-10 NOTE — Therapy (Addendum)
[facility] [address] [place], [place], [address] Phone: [phone]   Fax:  [phone]  Physical Therapy Evaluation  Patient Details  Name: Alexandra Young MRN: [id number] Date of Birth: [birth year] Referring Provider (PT): Yolonda Kida, MD   Check all possible CPT codes:      []  97110 (Therapeutic Exercise)  []  92507 (SLP Treatment)  []  [phone] (Neuro Re-ed)   []  92526 (Swallowing Treatment)   []  [phone] (Gait Training)   []  [phone] (Cognitive Training, 1st 15 minutes) []  97140 (Manual Therapy)   []  97130 (Cognitive Training, each add'l 15 minutes)  []  97530 (Therapeutic Activities)  []  Other, List CPT Code ____________    []  97535 (Self Care)       [x]  All codes above (97110 - 97535)  []  97012 (Mechanical Traction)  [x]  97014 (E-stim Unattended)  []  97032 (E-stim manual)  [x]  97033 (Ionto)  [x]  97035 (Ultrasound)  [x]  97016 (Vaso)  []  97760 (Orthotic Fit) []  33383 (Prosthetic Training) []  (Physical Performance Training) [x]  (Aquatic Therapy) []  29191 (Canalith Repositioning) []  (Contrast Bath) []  66060 (Paraffin) []  97597 (Wound Care 1st 20 sq cm) []  97598 (Wound Care each add'l 20 sq cm)      Encounter Date: 05/10/2020   PT End of Session - 05/10/20 1401    Visit Number 1    Number of Visits 4    Authorization Type MCD    PT Start Time 1402    PT Stop Time 1448    PT Time Calculation (min) 46 min    Activity Tolerance Patient tolerated treatment well    Behavior During Therapy WFL for tasks assessed/performed           Past Medical History:  Diagnosis Date  . Anxiety   . Asthma   . Depression   . IBS (irritable bowel syndrome)     History reviewed. No pertinent surgical history.  There were no vitals filed for this visit.    Subjective Assessment - 05/10/20 1404    Subjective Pt reports bilateral knee pain. Pt states that ortho told her that her patella was pinching on fat  pad. Pt states she had therapy initially when she was 15 and it helped a little bit, and then a year ago she tried it again but it didn't help any. At rest reports 4/10 (R worse than L), 9/10 pain at worst in standing.    Limitations Standing;Walking    How long can you stand comfortably? 1-2 hours    How long can you walk comfortably? Reports avoiding walking; maybe 1-2 hours    Diagnostic tests XR: effusion. MRI: with pinching on his fat pad    Patient Stated Goals Go half the day without any pain (at least 8 hour shift); return to working out              [facility] PT Assessment - 05/10/20 0001      Assessment   Medical Diagnosis M79.4 (ICD-10-CM) - Hypertrophy of (infrapatellar) fat pad    Referring Provider (PT) , MD    Prior Therapy 1 yr ago      Precautions   Precautions None      Balance Screen   Has the patient fallen in the past 6 months No      Home Environment   Living Environment Private residence    Living Arrangements Other (Comment)   cousin   Available Help at Discharge Family  Type of Home House    Home Access Stairs to enter    Entrance Stairs-Number of Steps 2      Prior Function   Level of Independence Independent      Observation/Other Assessments   Focus on Therapeutic Outcomes (FOTO)  n/a      Functional Tests   Functional tests Squat;Step up;Step down;Single leg stance      Squat   Comments increased knee flexion      Step Up   Comments Knee valgus   Reports more pain with stepping up vs down     Step Down   Comments knee valgus      Single Leg Stance   Comments 30 sec bilat      ROM / Strength   AROM / PROM / Strength AROM;Strength      AROM   Right/Left Knee --   [facility] bilat     Strength   Right Hip Flexion 5/5    Right Hip Extension 4/5    Right Hip ABduction 3+/5    Left Hip Flexion 5/5    Left Hip Extension 4+/5    Left Hip ABduction 4-/5    Right Knee Flexion 4+/5   painful along lateral patella    Right Knee Extension 5/5    Left Knee Flexion 4+/5   painful along lateral patella   Left Knee Extension 5/5    Right/Left Ankle Right;Left    Right Ankle Dorsiflexion 5/5    Right Ankle Plantar Flexion 5/5    Left Ankle Dorsiflexion 5/5    Left Ankle Plantar Flexion 5/5      Palpation   Patella mobility Good mobility    Palpation comment bilat MCL lax during knee ligament testing with increased pain      Special Tests   Knee Special tests  Patellofemoral Apprehension Test      Patellofemoral Apprehension Test    Findings Negative                      Objective measurements completed on examination: See above findings.               PT Education - 05/10/20 1723    Education Details Discussed anatomy of relation between knee, hip and back. Discussed POC    Person(s) Educated Patient    Methods Explanation;Demonstration;Tactile cues;Verbal cues    Comprehension Verbalized understanding;Returned demonstration;Verbal cues required            PT Short Term Goals - 05/10/20 1724      PT SHORT TERM GOAL #1   Title Pt will be independent with initial HEP    Baseline Newly provided    Time 3    Period Weeks    Status New    Target Date 05/31/20      PT SHORT TERM GOAL #2   Title Pt will report decrease in pain by at least 2 points at its worst    Baseline 8 or 9/10 at its worst    Time 3    Period Weeks    Status New    Target Date 05/31/20      PT SHORT TERM GOAL #3   Title Pt will be able to squat x10 at least 10#s to demonstrate improved bilat hip/LE functional strength    Baseline Unable without pain    Time 3    Period Weeks    Status New    Target Date 05/31/20  PT SHORT TERM GOAL #4   Title Pt will be able to tolerate 4 hours of working her shift    Baseline 1-2  hours    Time 3    Period Weeks    Status New    Target Date 05/31/20             PT Long Term Goals - 05/10/20 1727      PT LONG TERM GOAL #1   Title Pt  will be independent with advanced HEP to return to gym workouts    Time 6    Period Weeks    Status New    Target Date 05/31/20      PT LONG TERM GOAL #2   Title Pt will be able to tolerate an 8 hour shift without knee discomfort    Baseline Unable    Time 6    Period Weeks    Status New    Target Date 06/21/20      PT LONG TERM GOAL #3   Title Pt will be able to tolerate ascending/descend 10 steps without pain    Baseline Unable    Time 6    Period Weeks    Status New    Target Date 06/21/20                  Plan - 05/10/20 1507    Clinical Impression Statement Pt is a 19 y/o F presenting to OPPT with complaint of bilat knee pain. Pt presents with bilateral hip weakness with flexible LCL & MCL, poor knee control and mechanics likely contributing to her knee pain. Pt limited in her community and work mobility due to these issues and would benefit from PT to address these problems.    Personal Factors and Comorbidities Fitness;Time since onset of injury/illness/exacerbation    Examination-Activity Limitations Stairs;Stand    Examination-Participation Restrictions Occupation;Community Activity    Stability/Clinical Decision Making Stable/Uncomplicated    Clinical Decision Making Low    Rehab Potential Good    PT Frequency 1x / week    PT Duration 6 weeks    PT Treatment/Interventions ADLs/Self Care Home Management;Electrical Stimulation;Iontophoresis 4mg /ml Dexamethasone;Moist Heat;Traction;Ultrasound;Contrast Bath;Gait training;Stair training;Functional mobility training;Therapeutic activities;Therapeutic exercise;Neuromuscular re-education;Patient/family education;Orthotic Fit/Training;Manual techniques;Passive range of motion;Taping;Vasopneumatic Device;Vestibular    PT Next Visit Plan Assess response to HEP. Continue hip strengthening/stabilization. Work on eccentric knee stability.    PT Home Exercise Plan Access Code:    Consulted and Agree with Plan of Care  Patient           Patient will benefit from skilled therapeutic intervention in order to improve the following deficits and impairments:  Improper body mechanics, Decreased activity tolerance, Decreased strength, Hypermobility  Visit Diagnosis: Chronic pain of left knee  Chronic pain of right knee  Muscle weakness (generalized)     Problem List There are no problems to display for this patient.   [facility] [address] PT, DPT 05/10/2020, 5:36 PM  [facility] [address] [place], [place], [place] Phone: [phone]   Fax:  [phone]  Name: Alexandra Young MRN: Morrison Old Date of Birth: 04-08-2001

## 2020-05-10 NOTE — Patient Instructions (Signed)
Access Code: [email or web address] URL: [email or web address] Date: 05/10/2020 Prepared by: Vernon Prey April Kirstie Peri  Exercises Clamshell with Resistance - 1 x daily - 7 x weekly - 3 sets - 10 reps Seated Hamstring Curls with Resistance - 1 x daily - 7 x weekly - 3 sets - 10 reps Prone Hip Extension with Bent Knee - 1 x daily - 7 x weekly - 3 sets - 10 reps

## 2020-05-20 ENCOUNTER — Encounter (HOSPITAL_COMMUNITY): Payer: Self-pay

## 2020-05-20 ENCOUNTER — Ambulatory Visit (HOSPITAL_COMMUNITY)
Admission: EM | Admit: 2020-05-20 | Discharge: 2020-05-20 | Disposition: A | Discharge status: Home / Self Care | Payer: Medicaid Other | Attending: Urgent Care | Admitting: Urgent Care

## 2020-05-20 ENCOUNTER — Other Ambulatory Visit: Payer: Self-pay

## 2020-05-20 DIAGNOSIS — J039 Acute tonsillitis, unspecified: Secondary | ICD-10-CM

## 2020-05-20 DIAGNOSIS — Z79899 Other long term (current) drug therapy: Secondary | ICD-10-CM | POA: Insufficient documentation

## 2020-05-20 DIAGNOSIS — K589 Irritable bowel syndrome without diarrhea: Secondary | ICD-10-CM | POA: Diagnosis not present

## 2020-05-20 DIAGNOSIS — J029 Acute pharyngitis, unspecified: Secondary | ICD-10-CM | POA: Diagnosis not present

## 2020-05-20 DIAGNOSIS — J45909 Unspecified asthma, uncomplicated: Secondary | ICD-10-CM | POA: Insufficient documentation

## 2020-05-20 DIAGNOSIS — Z791 Long term (current) use of non-steroidal anti-inflammatories (NSAID): Secondary | ICD-10-CM | POA: Diagnosis not present

## 2020-05-20 DIAGNOSIS — Z8616 Personal history of COVID-19: Secondary | ICD-10-CM | POA: Insufficient documentation

## 2020-05-20 DIAGNOSIS — Z20822 Contact with and (suspected) exposure to covid-19: Secondary | ICD-10-CM | POA: Insufficient documentation

## 2020-05-20 DIAGNOSIS — J309 Allergic rhinitis, unspecified: Secondary | ICD-10-CM

## 2020-05-20 LAB — POCT RAPID STREP A, ED / UC: Streptococcus, Group A Screen (Direct): NEGATIVE

## 2020-05-20 LAB — SARS CORONAVIRUS 2 (TAT 6-24 HRS): SARS Coronavirus 2: NEGATIVE

## 2020-05-20 MED ORDER — CEFDINIR 300 MG PO CAPS
300.0000 mg | ORAL_CAPSULE | Freq: Two times a day (BID) | ORAL | 0 refills | Status: DC
Start: 2020-05-20 — End: 2020-08-08

## 2020-05-20 MED ORDER — FLUCONAZOLE 150 MG PO TABS
150.0000 mg | ORAL_TABLET | ORAL | 0 refills | Status: DC
Start: 2020-05-20 — End: 2021-01-04

## 2020-05-20 MED ORDER — PREDNISONE 20 MG PO TABS
ORAL_TABLET | ORAL | 0 refills | Status: DC
Start: 2020-05-20 — End: 2020-08-08

## 2020-05-20 NOTE — ED Provider Notes (Signed)
[facility]   MRN: [id number] DOB: 2001/04/26  Subjective:   Alexandra Young is a 19 y.o. female presenting for 1 day history of cute onset recurrent throat pain, nausea, sinus headache.  Has chronic allergic rhinitis, takes Flonase, Singulair and Zyrtec every day.  She has had a history of tonsil infections, last one was a few months ago.  Denies fever, cough, chest pain, shortness of breath.  Denies loss of sense of taste and smell.  She did receive the Covid vaccination and completed it about 2 months ago.  Had COVID-19 about a year ago.  No current facility-administered medications for this encounter.  Current Outpatient Medications:  .  cetirizine (ZYRTEC) 10 MG tablet, Take 10 mg by mouth daily., Disp: , Rfl:  .  linaclotide (LINZESS) 145 MCG CAPS capsule, Take 145 mcg by mouth daily before breakfast., Disp: , Rfl:  .  montelukast (SINGULAIR) 10 MG tablet, Take by mouth., Disp: , Rfl:  .  omeprazole (PRILOSEC) 10 MG capsule, Take 10 mg by mouth daily., Disp: , Rfl:  .  alum & mag hydroxide-simeth (MAALOX MULTI SYMPTOM MAX ST) 400-400-40 MG/5ML suspension, Take 10 mLs by mouth every 6 (six) hours as needed for indigestion., Disp: 355 mL, Rfl: 0 .  butalbital-acetaminophen-caffeine (FIORICET) 50-325-40 MG tablet, Take by mouth., Disp: , Rfl:  .  cholecalciferol (VITAMIN D3) 25 MCG (1000 UT) tablet, Take 1,000 Units by mouth daily., Disp: , Rfl:  .  dextromethorphan-guaiFENesin (MUCINEX DM) 30-600 MG 12hr tablet, Take 1 tablet by mouth 2 (two) times daily., Disp: 20 tablet, Rfl: 0 .  Menthol (CEPACOL SORE THROAT) 5.4 MG LOZG, Use as directed 1 lozenge (5.4 mg total) in the mouth or throat every 2 (two) hours as needed., Disp: 30 lozenge, Rfl: 0 .  naproxen (NAPROSYN) 500 MG tablet, Take 1 tablet (500 mg total) by mouth 2 (two) times daily., Disp: 30 tablet, Rfl: 0 .  polyethylene glycol (MIRALAX / GLYCOLAX) 17 g packet, Take 17 g by mouth daily., Disp: , Rfl:    No Known  Allergies  Past Medical History:  Diagnosis Date  . Anxiety   . Asthma   . Depression   . IBS (irritable bowel syndrome)      History reviewed. No pertinent surgical history.  Family History  Problem Relation Age of Onset  . Asthma Mother   . Thyroid disease Mother     Social History   Tobacco Use  . Smoking status: Never Smoker  . Smokeless tobacco: Never Used  Vaping Use  . Vaping Use: Never used  Substance Use Topics  . Alcohol use: Never  . Drug use: Never    ROS   Objective:   Vitals: BP 113/68 (BP Location: Left Arm)   Pulse (!) 124   Temp 100 F (37.8 C) (Oral)   Resp 20   SpO2 100%   Physical Exam Constitutional:      General: She is not in acute distress.    Appearance: Normal appearance. She is well-developed. She is not ill-appearing, toxic-appearing or diaphoretic.  HENT:     Head: Normocephalic and atraumatic.     Right Ear: Tympanic membrane and ear canal normal. No drainage or tenderness. No middle ear effusion. Tympanic membrane is not erythematous.     Left Ear: Tympanic membrane and ear canal normal. No drainage or tenderness.  No middle ear effusion. Tympanic membrane is not erythematous.     Nose: Nose normal. No congestion or rhinorrhea.  Mouth/Throat:     Mouth: Mucous membranes are moist. No oral lesions.     Pharynx: Pharyngeal swelling, oropharyngeal exudate and posterior oropharyngeal erythema present. No uvula swelling.     Tonsils: Tonsillar exudate present. No tonsillar abscesses. 1+ on the right. 1+ on the left.  Eyes:     Extraocular Movements: Extraocular movements intact.     Right eye: Normal extraocular motion.     Left eye: Normal extraocular motion.     Conjunctiva/sclera: Conjunctivae normal.     Pupils: Pupils are equal, round, and reactive to light.  Cardiovascular:     Rate and Rhythm: Normal rate and regular rhythm.     Pulses: Normal pulses.     Heart sounds: Normal heart sounds. No murmur heard.  No  friction rub. No gallop.   Pulmonary:     Effort: Pulmonary effort is normal. No respiratory distress.     Breath sounds: Normal breath sounds. No stridor. No wheezing, rhonchi or rales.  Musculoskeletal:     Cervical back: Normal range of motion and neck supple.  Lymphadenopathy:     Cervical: No cervical adenopathy.  Skin:    General: Skin is warm and dry.     Findings: No rash.  Neurological:     General: No focal deficit present.     Mental Status: She is alert and oriented to person, place, and time.  Psychiatric:        Mood and Affect: Mood normal.        Behavior: Behavior normal.        Thought Content: Thought content normal.     Results for orders placed or performed during the hospital encounter of 05/20/20 (from the past 24 hour(s))  POCT Rapid Strep A     Status: None   Collection Time: 05/20/20 10:32 AM  Result Value Ref Range   Streptococcus, Group A Screen (Direct) NEGATIVE NEGATIVE    Assessment and Plan :   PDMP not reviewed this encounter.  1. Tonsillitis   2. Sore throat   3. Chronic allergic rhinitis     Will treat empirically for tonsillitis given physical exam findings.  Patient is to start cefdinir, use supportive care otherwise.  Recommended patient follow-up with PCP, consider referral to ENT for frequent tonsillitis.  Counseled patient on potential for adverse effects with medications prescribed/recommended today, ER and return-to-clinic precautions discussed, patient verbalized understanding.    Wallis Bamberg, PA-C 05/20/20 1051

## 2020-05-20 NOTE — ED Triage Notes (Signed)
Pt c/o sore throat, HA, nausea since yesterday. Pt reports chronic congestion that she attributes to allergies. Denies fever, runny nose, ear pain, cough, v/d, or loss of taste/smell. Oropharynx with erythema and edematous tonsils. Taking tramadol for knee pain.

## 2020-05-22 LAB — CULTURE, GROUP A STREP (THRC)

## 2020-05-27 ENCOUNTER — Ambulatory Visit: Payer: Medicaid Other | Attending: Orthopedic Surgery | Admitting: Physical Therapy

## 2020-05-27 ENCOUNTER — Encounter: Payer: Self-pay | Admitting: Physical Therapy

## 2020-05-27 ENCOUNTER — Other Ambulatory Visit: Payer: Self-pay

## 2020-05-27 DIAGNOSIS — M25561 Pain in right knee: Secondary | ICD-10-CM | POA: Insufficient documentation

## 2020-05-27 DIAGNOSIS — M6281 Muscle weakness (generalized): Secondary | ICD-10-CM | POA: Diagnosis present

## 2020-05-27 DIAGNOSIS — M25562 Pain in left knee: Secondary | ICD-10-CM | POA: Insufficient documentation

## 2020-05-27 DIAGNOSIS — G8929 Other chronic pain: Secondary | ICD-10-CM | POA: Diagnosis present

## 2020-05-27 NOTE — Therapy (Signed)
[facility] Outpatient Rehabilitation [facility] [address] [place], [place], [address] Phone: [phone]   Fax:  [phone]  Physical Therapy Treatment  Patient Details  Name: Alexandra Young MRN: [id number] Date of Birth: 02-18-2001 Referring Provider (PT): Yolonda Kida, MD   Encounter Date: 05/27/2020   PT End of Session - 05/27/20 1256    Visit Number 2    Number of Visits 4    Authorization Type MCD [employer]    Authorization Time Period No auth required until 9/28    Authorization - Visit Number 1    Authorization - Number of Visits 27    PT Start Time 1256    PT Stop Time 1334    PT Time Calculation (min) 38 min    Activity Tolerance Patient tolerated treatment well;Patient limited by pain    Behavior During Therapy [facility] for tasks assessed/performed           Past Medical History:  Diagnosis Date   Anxiety    Asthma    Depression    IBS (irritable bowel syndrome)     History reviewed. No pertinent surgical history.  There were no vitals filed for this visit.   Subjective Assessment - 05/27/20 1255    Subjective Patient reports she has been hurting a lot more. States that she started having a shooting pain in right knee and hip anytime she puts weight on the right leg. Currently she is having pain at rest from the right knee up to the hip and lower back, left knee feels alright.    Patient Stated Goals Go half the day without any pain (at least 8 hour shift); return to working out    Currently in Pain? Yes    Pain Score 8    Left knee: 2-3/10   Pain Location Knee    Pain Orientation Right    Pain Descriptors / Indicators Shooting;Aching;Burning    Pain Type Chronic pain    Pain Onset More than a month ago    Pain Frequency Constant    Aggravating Factors  Bending, walking, standing    Pain Relieving Factors Medication                             [facility] Adult PT Treatment/Exercise - 05/27/20 0001       Exercises   Exercises Knee/Hip      Knee/Hip Exercises: Supine   Quad Sets 10 reps   5 sec hold   Quad Sets Limitations towel roll under knee    Bridges 10 reps    Bridges Limitations patient report pinching sensation right hip/lower back so  d/c'd    Straight Leg Raises 10 reps    Straight Leg Raises Limitations patient reported increased pain right hip so d/c'd    Other Supine Knee/Hip Exercises Bent knee fall out x 10 each      Knee/Hip Exercises: Sidelying   Clams x 10      Knee/Hip Exercises: Prone   Hamstring Curl 10 reps                  PT Education - 05/27/20 1255    Education Details HEP, FOTO    Person(s) Educated Patient    Methods Explanation;Demonstration;Verbal cues;Handout    Comprehension Verbalized understanding;Returned demonstration;Verbal cues required;Need further instruction            PT Short Term Goals - 05/10/20 1724  PT SHORT TERM GOAL #1   Title Pt will be independent with initial HEP    Baseline Newly provided    Time 3    Period Weeks    Status New    Target Date 05/31/20      PT SHORT TERM GOAL #2   Title Pt will report decrease in pain by at least 2 points at its worst    Baseline 8 or 9/10 at its worst    Time 3    Period Weeks    Status New    Target Date 05/31/20      PT SHORT TERM GOAL #3   Title Pt will be able to squat x10 at least 10#s to demonstrate improved bilat hip/LE functional strength    Baseline Unable without pain    Time 3    Period Weeks    Status New    Target Date 05/31/20      PT SHORT TERM GOAL #4   Title Pt will be able to tolerate 4 hours of working her shift    Baseline 1-2  hours    Time 3    Period Weeks    Status New    Target Date 05/31/20             PT Long Term Goals - 05/10/20 1727      PT LONG TERM GOAL #1   Title Pt will be independent with advanced HEP to return to gym workouts    Time 6    Period Weeks    Status New    Target Date 05/31/20      PT LONG TERM  GOAL #2   Title Pt will be able to tolerate an 8 hour shift without knee discomfort    Baseline Unable    Time 6    Period Weeks    Status New    Target Date 06/21/20      PT LONG TERM GOAL #3   Title Pt will be able to tolerate ascending/descend 10 steps without pain    Baseline Unable    Time 6    Period Weeks    Status New    Target Date 06/21/20                 Plan - 05/27/20 1256    Clinical Impression Statement Patient tolerated therapy well with no adverse effects. She reported increased right knee pain and new onset right hip/back pain with previous HEP so trialed various exercises to be able to perform hip/quad exercise without exacerbating hip or knee symptoms. Updated her HEP with regression of exercise that continues to focus on hip and knee strengthening. Patient able to tolerate new exercises and she was instructed on how to modify HEP if she experiences increased pain. She would benefit from continued skilled PT to progress hip and knee strength to reduce pain with walking and while at work.    PT Treatment/Interventions ADLs/Self Care Home Management;Electrical Stimulation;Iontophoresis 4mg /ml Dexamethasone;Moist Heat;Traction;Ultrasound;Contrast Bath;Gait training;Stair training;Functional mobility training;Therapeutic activities;Therapeutic exercise;Neuromuscular re-education;Patient/family education;Orthotic Fit/Training;Manual techniques;Passive range of motion;Taping;Vasopneumatic Device;Vestibular    PT Next Visit Plan Review HEP and progress PRN, progress hip and knee strengthening as able, manual and modalities PRN    PT Home Exercise Plan : quad set, sidelying clamshell, supine bend knee fall out, prone hamstring curl    Consulted and Agree with Plan of Care Patient           Patient will benefit from skilled  therapeutic intervention in order to improve the following deficits and impairments:  Improper body mechanics, Decreased activity tolerance,  Decreased strength, Hypermobility  Visit Diagnosis: Chronic pain of left knee  Chronic pain of right knee  Muscle weakness (generalized)     Problem List There are no problems to display for this patient.   Rosana Hoes, PT, DPT, LAT, ATC 05/27/20  1:41 PM Phone: [phone] Fax: [phone]   [facility] Outpatient Rehabilitation [facility] [address] [place], [place], [address] Phone: [phone]   Fax:  [phone]  Name: Alexandra Young MRN: [id number] Date of Birth: [birth year]

## 2020-05-27 NOTE — Patient Instructions (Signed)
Access Code: [email or web address] URL: [email or web address] Date: 05/27/2020 Prepared by: Rosana Hoes  Exercises Supine Quad Set - 2 x daily - 7 x weekly - 2 sets - 10 reps - 5 seconds hold Clamshell - 2 x daily - 7 x weekly - 2 sets - 10 reps Bent Knee Fallouts - 2 x daily - 7 x weekly - 2 sets - 10 reps Prone Knee Flexion Extension AROM - 2 x daily - 7 x weekly - 2 sets - 10 reps

## 2020-05-30 NOTE — Addendum Note (Signed)
Addended by: Alease Medina L on: 05/30/2020 08:34 AM   Modules accepted: Orders

## 2020-06-03 ENCOUNTER — Other Ambulatory Visit: Payer: Self-pay

## 2020-06-03 ENCOUNTER — Ambulatory Visit: Payer: Medicaid Other | Admitting: Physical Therapy

## 2020-06-03 ENCOUNTER — Encounter: Payer: Self-pay | Admitting: Physical Therapy

## 2020-06-03 DIAGNOSIS — M6281 Muscle weakness (generalized): Secondary | ICD-10-CM

## 2020-06-03 DIAGNOSIS — G8929 Other chronic pain: Secondary | ICD-10-CM

## 2020-06-03 DIAGNOSIS — M25562 Pain in left knee: Secondary | ICD-10-CM

## 2020-06-03 NOTE — Therapy (Signed)
[facility] [address] [place], [place], [address] Phone: [phone]   Fax:  [phone]  Physical Therapy Treatment  Patient Details  Name: Alexandra Young MRN: [id number] Date of Birth: 05/25/2001 Referring Provider (PT): Yolonda Kida, MD   Encounter Date: 06/03/2020   PT End of Session - 06/03/20 1305    Visit Number 3    Number of Visits 6    Date for PT Re-Evaluation 06/21/20    Authorization Type MCD [facility]    Authorization Time Period No auth required until 9/28    Authorization - Visit Number 2    Authorization - Number of Visits 27    PT Start Time 1308    PT Stop Time 1350    PT Time Calculation (min) 42 min           Past Medical History:  Diagnosis Date  . Anxiety   . Asthma   . Depression   . IBS (irritable bowel syndrome)     History reviewed. No pertinent surgical history.  There were no vitals filed for this visit.   Subjective Assessment - 06/03/20 1310    Subjective "I am not hurting as bad. my hip was bothering for 2 weeks I think it was my sciatic nerve which I am unsure as to what caused."    Currently in Pain? Yes    Pain Score 3     Pain Location Knee    Pain Orientation Right    Pain Descriptors / Indicators Aching    Pain Type Chronic pain    Pain Onset More than a month ago    Pain Frequency Intermittent    Aggravating Factors  prolonged standing/ walking.    Pain Relieving Factors medication              [facility] PT Assessment - 06/03/20 0001      Assessment   Medical Diagnosis M79.4 (ICD-10-CM) - Hypertrophy of (infrapatellar) fat pad                         [facility] Adult PT Treatment/Exercise - 06/03/20 0001      Knee/Hip Exercises: Stretches   Piriformis Stretch 2 reps;30 seconds;Right      Knee/Hip Exercises: Seated   Long Arc Quad 2 sets;10 reps   12, with controlled eccentric lowering   Long Arc Quad Weight 3 lbs.    Sit to Sand 10  reps;without UE support;2 sets   focus on eccentric loading x 3 sec      Knee/Hip Exercises: Supine   Quad Sets Strengthening;2 sets;10 reps   with ball squeeze to prmote vmo activation     Manual Therapy   Manual Therapy Taping;Soft tissue mobilization    Manual therapy comments MTPR for the rectus femoris x 3, and vastsu   seated trigger point release sitting on ball, for piriformis   Soft tissue mobilization IASTM along the patellar tendon    McConnell Lateral > medial taping on R knee                  PT Education - 06/03/20 1358    Education Details reviewed HEP    Person(s) Educated Patient    Methods Explanation    Comprehension Verbalized understanding            PT Short Term Goals - 05/10/20 1724      PT SHORT TERM GOAL #1   Title Pt will  be independent with initial HEP    Baseline Newly provided    Time 3    Period Weeks    Status New    Target Date 05/31/20      PT SHORT TERM GOAL #2   Title Pt will report decrease in pain by at least 2 points at its worst    Baseline 8 or 9/10 at its worst    Time 3    Period Weeks    Status New    Target Date 05/31/20      PT SHORT TERM GOAL #3   Title Pt will be able to squat x10 at least 10#s to demonstrate improved bilat hip/LE functional strength    Baseline Unable without pain    Time 3    Period Weeks    Status New    Target Date 05/31/20      PT SHORT TERM GOAL #4   Title Pt will be able to tolerate 4 hours of working her shift    Baseline 1-2  hours    Time 3    Period Weeks    Status New    Target Date 05/31/20             PT Long Term Goals - 05/10/20 1727      PT LONG TERM GOAL #1   Title Pt will be independent with advanced HEP to return to gym workouts    Time 6    Period Weeks    Status New    Target Date 05/31/20      PT LONG TERM GOAL #2   Title Pt will be able to tolerate an 8 hour shift without knee discomfort    Baseline Unable    Time 6    Period Weeks    Status New     Target Date 06/21/20      PT LONG TERM GOAL #3   Title Pt will be able to tolerate ascending/descend 10 steps without pain    Baseline Unable    Time 6    Period Weeks    Status New    Target Date 06/21/20                 Plan - 06/03/20 1354    Clinical Impression Statement pt reports decreased pain today but continues to report lateral knee pain as well as pain over the patellar tendon. She responded well to [facility] along the rectus femoris and vastus lateralis and patellar low load long durationg medial glide, and reported relief with IASTM along the patellar tendon. She completed all exercise well with focus on eccentric loading, trialed McConnel taping which she reported no pain in the knee.    PT Treatment/Interventions ADLs/Self Care Home Management;Electrical Stimulation;Iontophoresis 4mg /ml Dexamethasone;Moist Heat;Traction;Ultrasound;Contrast Bath;Gait training;Stair training;Functional mobility training;Therapeutic activities;Therapeutic exercise;Neuromuscular re-education;Patient/family education;Orthotic Fit/Training;Manual techniques;Passive range of motion;Taping;Vasopneumatic Device;Vestibular    PT Next Visit Plan Review HEP and progress PRN, progress hip and knee strengthening as able, manual and modalities PRN how was taping? discuss DN For rectus femoris and vastus lateralis.    PT Home Exercise Plan : quad set, sidelying clamshell, supine bend knee fall out, prone hamstring curl    Consulted and Agree with Plan of Care Patient           Patient will benefit from skilled therapeutic intervention in order to improve the following deficits and impairments:  Improper body mechanics, Decreased activity tolerance, Decreased strength, Hypermobility  Visit Diagnosis: Chronic pain of  left knee  Chronic pain of right knee  Muscle weakness (generalized)     Problem List There are no problems to display for this patient.   Lulu Riding PT, DPT, LAT,  ATC  06/03/20  1:59 PM      [facility] [address] [place], [place], [address] Phone: [phone]   Fax:  [phone]  Name: Alexandra Young MRN: [id number] Date of Birth: 2001-03-05

## 2020-06-10 ENCOUNTER — Other Ambulatory Visit: Payer: Self-pay

## 2020-06-10 ENCOUNTER — Ambulatory Visit: Payer: Medicaid Other | Attending: Orthopedic Surgery | Admitting: Physical Therapy

## 2020-06-10 ENCOUNTER — Encounter: Payer: Self-pay | Admitting: Physical Therapy

## 2020-06-10 DIAGNOSIS — M6281 Muscle weakness (generalized): Secondary | ICD-10-CM | POA: Diagnosis present

## 2020-06-10 DIAGNOSIS — M25562 Pain in left knee: Secondary | ICD-10-CM | POA: Insufficient documentation

## 2020-06-10 DIAGNOSIS — G8929 Other chronic pain: Secondary | ICD-10-CM

## 2020-06-10 DIAGNOSIS — M25561 Pain in right knee: Secondary | ICD-10-CM | POA: Insufficient documentation

## 2020-06-10 NOTE — Therapy (Signed)
[facility] [address] [place], [place], [address] Phone: [phone]   Fax:  [phone]  Physical Therapy Treatment / Re-certification  Patient Details  Name: Alexandra Young MRN: [id number] Date of Birth: 21-May-2001 Referring Provider (PT): Yolonda Kida, MD   Encounter Date: 06/10/2020   PT End of Session - 06/10/20 1317    Visit Number 4    Number of Visits 12    Date for PT Re-Evaluation 07/22/20    Authorization Type M[employer]    Authorization Time Period No auth required until 9/28    Authorization - Visit Number 3    Authorization - Number of Visits 27    PT Start Time 1316    PT Stop Time 1357    PT Time Calculation (min) 41 min    Activity Tolerance Patient tolerated treatment well;Patient limited by pain    Behavior During Therapy [facility] for tasks assessed/performed           Past Medical History:  Diagnosis Date   Anxiety    Asthma    Depression    IBS (irritable bowel syndrome)     History reviewed. No pertinent surgical history.  There were no vitals filed for this visit.   Subjective Assessment - 06/10/20 1318    Subjective "The tape helped but the pain came back after the tape came off."    Patient Stated Goals Go half the day without any pain (at least 8 hour shift); return to working out    Currently in Pain? Yes    Pain Score 4     Pain Location Knee    Pain Orientation Right              [facility] PT Assessment - 06/10/20 0001      Assessment   Medical Diagnosis M79.4 (ICD-10-CM) - Hypertrophy of (infrapatellar) fat pad                         [facility] Adult PT Treatment/Exercise - 06/10/20 0001      Self-Care   Self-Care Other Self-Care Comments    Other Self-Care Comments  reviewed taping techniques and where she can purchase tape       Knee/Hip Exercises: Stretches   Piriformis Stretch 2 reps;30 seconds;Right      Knee/Hip Exercises: Standing   Heel  Raises 2 sets;20 reps   with ball squeeze for posterior tib activation     Knee/Hip Exercises: Supine   Short Arc Quad Sets Strengthening;2 sets;15 reps   with ball squeeze 3#   Straight Leg Raise with External Rotation Strengthening;2 sets;10 reps   3#     Knee/Hip Exercises: Sidelying   Hip ABduction Strengthening;Right;2 sets;15 reps    Other Sidelying Knee/Hip Exercises hip ER R only in R sidleying 2 x 10 3#      Manual Therapy   Manual Therapy Soft tissue mobilization;Joint mobilization    Manual therapy comments skilled palpation and monitoring of pt during TPDN    Joint Mobilization low load long duration R patella    Soft tissue mobilization IASTM along the patellar tendon    McConnell Lateral > medial taping on R knee            Trigger Point Dry Needling - 06/10/20 0001    Consent Given? Yes    Education Handout Provided Yes    Muscles Treated Lower Quadrant Vastus lateralis    Vastus lateralis Response Twitch  response elicited;Palpable increased muscle length   R LE                 PT Short Term Goals - 05/10/20 1724      PT SHORT TERM GOAL #1   Title Pt will be independent with initial HEP    Baseline Newly provided    Time 3    Period Weeks    Status New    Target Date 05/31/20      PT SHORT TERM GOAL #2   Title Pt will report decrease in pain by at least 2 points at its worst    Baseline 8 or 9/10 at its worst    Time 3    Period Weeks    Status New    Target Date 05/31/20      PT SHORT TERM GOAL #3   Title Pt will be able to squat x10 at least 10#s to demonstrate improved bilat hip/LE functional strength    Baseline Unable without pain    Time 3    Period Weeks    Status New    Target Date 05/31/20      PT SHORT TERM GOAL #4   Title Pt will be able to tolerate 4 hours of working her shift    Baseline 1-2  hours    Time 3    Period Weeks    Status New    Target Date 05/31/20             PT Long Term Goals - 06/10/20 1422       PT LONG TERM GOAL #1   Title Pt will be independent with advanced HEP to return to gym workouts    Baseline no previous HEP    Period Weeks    Status On-going    Target Date 07/22/20      PT LONG TERM GOAL #2   Title Pt will be able to tolerate an 8 hour shift without knee discomfort    Baseline continued pain/ discomfort with prolonged standing at work    Time 6    Period Weeks    Status New    Target Date 07/22/20      PT LONG TERM GOAL #3   Title Pt will be able to tolerate ascending/descend 10 steps without pain    Baseline Unable    Time 6    Period Weeks    Status On-going    Target Date 07/22/20                 Plan - 06/10/20 1414    Clinical Impression Statement Alexandra Young is making good progress with physical therapy reporting reduction in pain. She is making good progress toward her goals and is motivated to continue with physical therapy. Educated and consent was given to Alexandra Young focusing along the vastus lateralis followed with IASTM techniques and continued McConnel taping. she did well with hip/ ankle strengthening to promote proximal and distla support of the knee. she would benefit from continued physical therapy to decrease Knee pain, improve gross strength and maximize function by addressing the deficits listed.    Personal Factors and Comorbidities Fitness;Time since onset of injury/illness/exacerbation    PT Frequency 1x / week    PT Duration 6 weeks    PT Treatment/Interventions ADLs/Self Care Home Management;Electrical Stimulation;Iontophoresis 4mg /ml Dexamethasone;Moist Heat;Traction;Ultrasound;Contrast Bath;Gait training;Stair training;Functional mobility training;Therapeutic activities;Therapeutic exercise;Neuromuscular re-education;Patient/family education;Orthotic Fit/Training;Manual techniques;Passive range of motion;Taping;Vasopneumatic Device;Vestibular    PT Next Visit Plan  Review HEP and progress PRN, progress hip and knee strengthening as able,  manual and modalities PRN how was taping? response to DN For rectus femoris and vastus lateralis.    PT Home Exercise Plan [id number]: quad set, sidelying clamshell, supine bend knee fall out, prone hamstring curl, sit to stand and quad set with ball squeeze.    Consulted and Agree with Plan of Care Patient           Patient will benefit from skilled therapeutic intervention in order to improve the following deficits and impairments:  Improper body mechanics, Decreased activity tolerance, Decreased strength, Hypermobility  Visit Diagnosis: Chronic pain of left knee - Plan: PT plan of care cert/re-cert  Chronic pain of right knee - Plan: PT plan of care cert/re-cert  Muscle weakness (generalized) - Plan: PT plan of care cert/re-cert     Problem List There are no problems to display for this patient.  Alexandra Young PT, DPT, LAT, ATC  06/10/20  2:23 PM      [facility] [address] [place], [place], [address] Phone: [phone]   Fax:  [phone]  Name: Alexandra Young MRN: [id number] Date of Birth: 13-Jan-2001

## 2020-06-17 ENCOUNTER — Other Ambulatory Visit: Payer: Self-pay

## 2020-06-17 ENCOUNTER — Ambulatory Visit: Payer: Medicaid Other | Admitting: Physical Therapy

## 2020-06-17 DIAGNOSIS — M6281 Muscle weakness (generalized): Secondary | ICD-10-CM

## 2020-06-17 DIAGNOSIS — G8929 Other chronic pain: Secondary | ICD-10-CM

## 2020-06-17 DIAGNOSIS — M25562 Pain in left knee: Secondary | ICD-10-CM

## 2020-06-17 NOTE — Therapy (Signed)
[facility] Outpatient Rehabilitation [facility] [address] [place], [place], [address] Phone: [phone]   Fax:  [phone]  Physical Therapy Treatment  Patient Details  Name: Alexandra Young MRN: [id number] Date of Birth: [birth year] Referring Provider (PT): Yolonda Kida, MD   Encounter Date: 06/17/2020   PT End of Session - 06/17/20 1326    Visit Number 5    Number of Visits 12    Date for PT Re-Evaluation 07/22/20    Authorization Type [employer]    Authorization Time Period 06/10/2020 - 08/09/2020    Authorization - Visit Number 2    Authorization - Number of Visits 8    PT Start Time 1325   pt arrived late   PT Stop Time 1358    PT Time Calculation (min) 33 min    Activity Tolerance Patient tolerated treatment well;Patient limited by pain    Behavior During Therapy [facility] for tasks assessed/performed           Past Medical History:  Diagnosis Date  . Anxiety   . Asthma   . Depression   . IBS (irritable bowel syndrome)     No past surgical history on file.  There were no vitals filed for this visit.   Subjective Assessment - 06/17/20 1327    Subjective "I saw the MD this morning and that it isn't as inflammed and to keep doing the exercise with PT.    Patient Stated Goals Go half the day without any pain (at least 8 hour shift); return to working out    Currently in Pain? Yes              [facility] PT Assessment - 06/17/20 0001      Assessment   Medical Diagnosis M79.4 (ICD-10-CM) - Hypertrophy of (infrapatellar) fat pad    Referring Provider (PT) Yolonda Kida, MD                         [facility] Adult PT Treatment/Exercise - 06/17/20 0001      Knee/Hip Exercises: Standing   Heel Raises 2 sets;Both;20 reps    Forward Step Up --    Wall Squat 10 reps;1 set   each rep holding wall squat x 5 seconds ea.     Knee/Hip Exercises: Seated   Long Arc Quad Strengthening;Both;2 sets;10 reps   with ball squeeze for  VMO activation   Long Arc Quad Weight 3 lbs.      Knee/Hip Exercises: Sidelying   Hip ABduction Strengthening;Both;1 set;20 reps   followed with CW/ CCW circles     Manual Therapy   Manual therapy comments MTPR for the rectus femoris x 3, and vastsu   seated trigger point release sitting on ball, for piriformis   Joint Mobilization low load long duration L/R patella    Soft tissue mobilization IASTM along the patellar tendon R/L                    PT Short Term Goals - 06/10/20 1422      PT SHORT TERM GOAL #1   Title Pt will be independent with initial HEP    Period Weeks    Status Achieved      PT SHORT TERM GOAL #2   Title Pt will report decrease in pain by at least 2 points at its worst    Period Weeks    Status Achieved      PT SHORT  TERM GOAL #3   Title Pt will be able to squat x10 at least 10#s to demonstrate improved bilat hip/LE functional strength    Period Weeks    Status Achieved      PT SHORT TERM GOAL #4   Title Pt will be able to tolerate 4 hours of working her shift    Baseline 1-2  hours    Period Weeks    Status On-going             PT Long Term Goals - 06/10/20 1422      PT LONG TERM GOAL #1   Title Pt will be independent with advanced HEP to return to gym workouts    Baseline no previous HEP    Period Weeks    Status On-going    Target Date 07/22/20      PT LONG TERM GOAL #2   Title Pt will be able to tolerate an 8 hour shift without knee discomfort    Baseline continued pain/ discomfort with prolonged standing at work    Time 6    Period Weeks    Status New    Target Date 07/22/20      PT LONG TERM GOAL #3   Title Pt will be able to tolerate ascending/descend 10 steps without pain    Baseline Unable    Time 6    Period Weeks    Status On-going    Target Date 07/22/20                 Plan - 06/17/20 1343    Clinical Impression Statement pt reported seeing her MD today and noted she is making good progress with  physical therapy and to continue. continued STW along bil vastus lateralis and bil medial patellar glides. continued working on hip/ knee strengthening to promote patellar support.    PT Treatment/Interventions ADLs/Self Care Home Management;Electrical Stimulation;Iontophoresis 4mg /ml Dexamethasone;Moist Heat;Traction;Ultrasound;Contrast Bath;Gait training;Stair training;Functional mobility training;Therapeutic activities;Therapeutic exercise;Neuromuscular re-education;Patient/family education;Orthotic Fit/Training;Manual techniques;Passive range of motion;Taping;Vasopneumatic Device;Vestibular    PT Next Visit Plan Review HEP and progress PRN, progress hip and knee strengthening as able, manual and modalities PRN how was taping? response to DN For rectus femoris and vastus lateralis.    PT Home Exercise Plan : quad set, sidelying clamshell, supine bend knee fall out, prone hamstring curl, sit to stand and quad set with ball squeeze.           Patient will benefit from skilled therapeutic intervention in order to improve the following deficits and impairments:     Visit Diagnosis: Chronic pain of left knee  Chronic pain of right knee  Muscle weakness (generalized)     Problem List There are no problems to display for this patient.  [id number] PT, DPT, LAT, ATC  06/17/20  2:16 PM      [facility] [address] [place], [place], [place] Phone: [phone]   Fax:  [phone]  Name: Alexandra Young MRN: Morrison Old Date of Birth: 07-Jan-2001

## 2020-07-01 ENCOUNTER — Ambulatory Visit: Payer: Medicaid Other | Admitting: Physical Therapy

## 2020-07-01 ENCOUNTER — Encounter: Payer: Self-pay | Admitting: Physical Therapy

## 2020-07-01 ENCOUNTER — Other Ambulatory Visit: Payer: Self-pay

## 2020-07-01 DIAGNOSIS — M6281 Muscle weakness (generalized): Secondary | ICD-10-CM

## 2020-07-01 DIAGNOSIS — M25562 Pain in left knee: Secondary | ICD-10-CM | POA: Diagnosis not present

## 2020-07-01 DIAGNOSIS — G8929 Other chronic pain: Secondary | ICD-10-CM

## 2020-07-01 NOTE — Therapy (Signed)
[facility] [address] [place], [place], [address] Phone: [phone]   Fax:  [phone]  Physical Therapy Treatment  Patient Details  Name: Alexandra Young MRN: [id number] Date of Birth: [birth year] Referring Provider (PT): Yolonda Kida, MD   Encounter Date: 07/01/2020   PT End of Session - 07/01/20 0852    Visit Number 6    Number of Visits 12    Date for PT Re-Evaluation 07/22/20    Authorization Type [employer]    Authorization Time Period 06/10/2020 - 08/09/2020    Authorization - Visit Number 5    Authorization - Number of Visits 8    PT Start Time 0848    PT Stop Time 0930    PT Time Calculation (min) 42 min    Activity Tolerance Patient tolerated treatment well;Patient limited by pain    Behavior During Therapy [facility] for tasks assessed/performed           Past Medical History:  Diagnosis Date  . Anxiety   . Asthma   . Depression   . IBS (irritable bowel syndrome)     History reviewed. No pertinent surgical history.  There were no vitals filed for this visit.   Subjective Assessment - 07/01/20 0854    Subjective " I am doing okay today, alittle bit sore in the same place.    Currently in Pain? Yes    Pain Score 6     Pain Orientation Right    Pain Descriptors / Indicators Aching    Pain Type Chronic pain    Pain Onset More than a month ago    Pain Frequency Intermittent    Aggravating Factors  prolonged standing    Pain Relieving Factors medication/ exercise.              [facility] PT Assessment - 07/01/20 0001      Assessment   Medical Diagnosis M79.4 (ICD-10-CM) - Hypertrophy of (infrapatellar) fat pad    Referring Provider (PT) Yolonda Kida, MD                         [facility] Adult PT Treatment/Exercise - 07/01/20 0001      Knee/Hip Exercises: Aerobic   Elliptical L1 x ramp L1 5 min       Knee/Hip Exercises: Standing   Step Down Both;1 set;15 reps   cues  to keep knee aligned with foot   Stairs ascending/ descending 6 in steps x 5, 2 x with cues on proper form/ rationale      Manual Therapy   Manual therapy comments skilled palpation and monitoring of pt during TPDN    Joint Mobilization low load long duration L/R patella    Soft tissue mobilization IASTM along the patellar tendon R    McConnell --            Trigger Point Dry Needling - 07/01/20 0001    Consent Given? Yes    Education Handout Provided Yes    Electrical Stimulation Performed with Dry Needling Yes    E-stim with Dry Needling Details Frequency set at 20, intenstiy to tolerance and increasing PRN    Vastus lateralis Response Twitch response elicited;Palpable increased muscle length                  PT Short Term Goals - 06/10/20 1422      PT SHORT TERM GOAL #1   Title Pt will be independent  with initial HEP    Period Weeks    Status Achieved      PT SHORT TERM GOAL #2   Title Pt will report decrease in pain by at least 2 points at its worst    Period Weeks    Status Achieved      PT SHORT TERM GOAL #3   Title Pt will be able to squat x10 at least 10#s to demonstrate improved bilat hip/LE functional strength    Period Weeks    Status Achieved      PT SHORT TERM GOAL #4   Title Pt will be able to tolerate 4 hours of working her shift    Baseline 1-2  hours    Period Weeks    Status On-going             PT Long Term Goals - 06/10/20 1422      PT LONG TERM GOAL #1   Title Pt will be independent with advanced HEP to return to gym workouts    Baseline no previous HEP    Period Weeks    Status On-going    Target Date 07/22/20      PT LONG TERM GOAL #2   Title Pt will be able to tolerate an 8 hour shift without knee discomfort    Baseline continued pain/ discomfort with prolonged standing at work    Time 6    Period Weeks    Status New    Target Date 07/22/20      PT LONG TERM GOAL #3   Title Pt will be able to tolerate  ascending/descend 10 steps without pain    Baseline Unable    Time 6    Period Weeks    Status On-going    Target Date 07/22/20                 Plan - 07/01/20 0934    Clinical Impression Statement pt reports consistency with her HEP noting some soreness in the hip/ R low back. continued TPDN focusing on the R vastus lateralis combined with E-stim followed with IASTM techniques and patellar mobs. continued working on knee strengthening and functional mobility with stair training which she did well with notingminor soreness in the R knee compared bil.    PT Treatment/Interventions ADLs/Self Care Home Management;Electrical Stimulation;Iontophoresis 4mg /ml Dexamethasone;Moist Heat;Traction;Ultrasound;Contrast Bath;Gait training;Stair training;Functional mobility training;Therapeutic activities;Therapeutic exercise;Neuromuscular re-education;Patient/family education;Orthotic Fit/Training;Manual techniques;Passive range of motion;Taping;Vasopneumatic Device;Vestibular    PT Next Visit Plan Review HEP and progress PRN, progress hip and knee strengthening as able, manual and modalities PRN how was taping? response to DN For rectus femoris and vastus lateralis.    Consulted and Agree with Plan of Care Patient           Patient will benefit from skilled therapeutic intervention in order to improve the following deficits and impairments:  Improper body mechanics, Decreased activity tolerance, Decreased strength, Hypermobility  Visit Diagnosis: Chronic pain of left knee  Chronic pain of right knee  Muscle weakness (generalized)     Problem List There are no problems to display for this patient.  PT, DPT, LAT, ATC  07/01/20  9:38 AM      [facility] [address] [place], [place], [place] Phone: [phone]   Fax:  [phone]  Name: Shamar Kracke MRN: Morrison Old Date of Birth: 13-Nov-2000

## 2020-07-08 ENCOUNTER — Encounter: Payer: Self-pay | Admitting: Physical Therapy

## 2020-07-08 ENCOUNTER — Ambulatory Visit: Payer: Medicaid Other | Admitting: Physical Therapy

## 2020-07-08 ENCOUNTER — Other Ambulatory Visit: Payer: Self-pay

## 2020-07-08 DIAGNOSIS — M25562 Pain in left knee: Secondary | ICD-10-CM

## 2020-07-08 DIAGNOSIS — M25561 Pain in right knee: Secondary | ICD-10-CM

## 2020-07-08 DIAGNOSIS — G8929 Other chronic pain: Secondary | ICD-10-CM

## 2020-07-08 DIAGNOSIS — M6281 Muscle weakness (generalized): Secondary | ICD-10-CM

## 2020-07-08 NOTE — Therapy (Signed)
[facility]patient [facility] [address] [place], [place], [address] Phone: [phone]   Fax:  [phone]  Physical Therapy Treatment  Patient Details  Name: Lalani Winkles MRN: [id number] Date of Birth: 2001-01-15 Referring Provider (PT): Yolonda Kida, MD   Encounter Date: 07/08/2020   PT End of Session - 07/08/20 0855    Visit Number 7    Number of Visits 12    Date for PT Re-Evaluation 07/22/20    Authorization Type [employer]    Authorization Time Period 06/10/2020 - 08/09/2020    Authorization - Visit Number 6    PT Start Time 0855   pt arrived late   PT Stop Time 0928    PT Time Calculation (min) 33 min    Activity Tolerance Patient tolerated treatment well;Patient limited by pain    Behavior During Therapy [facility] for tasks assessed/performed           Past Medical History:  Diagnosis Date  . Anxiety   . Asthma   . Depression   . IBS (irritable bowel syndrome)     History reviewed. No pertinent surgical history.  There were no vitals filed for this visit.   Subjective Assessment - 07/08/20 0856    Subjective " I am doing pretty good today, only t alittle pain inthe side of the knee rated 5/10 in the R."    Patient Stated Goals Go half the day without any pain (at least 8 hour shift); return to working out    Currently in Pain? Yes    Pain Score 5     Pain Location Knee    Pain Orientation Right    Pain Descriptors / Indicators Aching    Pain Type Chronic pain    Pain Onset More than a month ago    Pain Frequency Intermittent    Aggravating Factors  stairs              [facility] PT Assessment - 07/08/20 0001      Assessment   Medical Diagnosis M79.4 (ICD-10-CM) - Hypertrophy of (infrapatellar) fat pad    Referring Provider (PT) Yolonda Kida, MD                         [facility] Adult PT Treatment/Exercise - 07/08/20 0001      Knee/Hip Exercises: Aerobic   Elliptical L1 x ramp L1 5  min       Knee/Hip Exercises: Standing   Heel Raises 2 sets;20 reps   form slant board with ball squeeze for post tib strengthenin   Hip Abduction Stengthening;2 sets;15 reps;Knee straight   with red theraband with bil HHA from counter   Step Down 10 reps;Step Height: 8";Both;1 set   cues to focus on controlled eccentric loading     Manual Therapy   Manual therapy comments MTPR for the  and vastus lateralis x 3   seated trigger point release sitting on ball, for piriformis   Joint Mobilization low load long duration L/R patella    Soft tissue mobilization DTM along the vastus lateralis with roller                    PT Short Term Goals - 06/10/20 1422      PT SHORT TERM GOAL #1   Title Pt will be independent with initial HEP    Period Weeks    Status Achieved      PT SHORT  TERM GOAL #2   Title Pt will report decrease in pain by at least 2 points at its worst    Period Weeks    Status Achieved      PT SHORT TERM GOAL #3   Title Pt will be able to squat x10 at least 10#s to demonstrate improved bilat hip/LE functional strength    Period Weeks    Status Achieved      PT SHORT TERM GOAL #4   Title Pt will be able to tolerate 4 hours of working her shift    Baseline 1-2  hours    Period Weeks    Status On-going             PT Long Term Goals - 06/10/20 1422      PT LONG TERM GOAL #1   Title Pt will be independent with advanced HEP to return to gym workouts    Baseline no previous HEP    Period Weeks    Status On-going    Target Date 07/22/20      PT LONG TERM GOAL #2   Title Pt will be able to tolerate an 8 hour shift without knee discomfort    Baseline continued pain/ discomfort with prolonged standing at work    Time 6    Period Weeks    Status New    Target Date 07/22/20      PT LONG TERM GOAL #3   Title Pt will be able to tolerate ascending/descend 10 steps without pain    Baseline Unable    Time 6    Period Weeks    Status On-going    Target  Date 07/22/20                 Plan - 07/08/20 0932    Clinical Impression Statement pt reports improvement in knee pain since the previous session but conitnues to report increased soreness with stairs. continued working on proximal and distal muscle strength to promote knee stability. she did well with all exercises but does fatigue quickly with hip abduction and step down exercises.    PT Next Visit Plan Review HEP and progress PRN, progress hip and knee strengthening as able, manual and modalities PRN how was taping? response to DN For rectus femoris and vastus lateralis. endurance training more visits?    PT Home Exercise Plan [id number]: quad set, sidelying clamshell, supine bend knee fall out, prone hamstring curl, sit to stand and quad set with ball squeeze.           Patient will benefit from skilled therapeutic intervention in order to improve the following deficits and impairments:     Visit Diagnosis: Chronic pain of left knee  Chronic pain of right knee  Muscle weakness (generalized)     Problem List There are no problems to display for this patient.  Lulu Riding PT, DPT, LAT, ATC  07/08/20  9:35 AM      [facility] [address] [place], [place], [address] Phone: [phone]   Fax:  [phone]  Name: Tanyla Stege MRN: [id number] Date of Birth: 29-Dec-2000

## 2020-07-15 ENCOUNTER — Other Ambulatory Visit: Payer: Self-pay

## 2020-07-15 ENCOUNTER — Ambulatory Visit: Payer: Medicaid Other | Admitting: Physical Therapy

## 2020-07-15 ENCOUNTER — Ambulatory Visit: Payer: Medicaid Other | Attending: Orthopedic Surgery | Admitting: Physical Therapy

## 2020-07-15 ENCOUNTER — Encounter: Payer: Self-pay | Admitting: Physical Therapy

## 2020-07-15 DIAGNOSIS — M6281 Muscle weakness (generalized): Secondary | ICD-10-CM | POA: Diagnosis present

## 2020-07-15 DIAGNOSIS — M25561 Pain in right knee: Secondary | ICD-10-CM | POA: Diagnosis present

## 2020-07-15 DIAGNOSIS — M25562 Pain in left knee: Secondary | ICD-10-CM | POA: Diagnosis present

## 2020-07-15 DIAGNOSIS — G8929 Other chronic pain: Secondary | ICD-10-CM | POA: Insufficient documentation

## 2020-07-15 NOTE — Patient Instructions (Signed)
Access Code: [email or web address] URL: [email or web address] Date: 07/15/2020 Prepared by: [facility].  Exercises Standing Heel Raises - 1 x daily - 7 x weekly - 2 sets - 20 reps Forward Step Down - 1 x daily - 7 x weekly - 3 sets - 10 reps Goblet Squat with Kettlebell - 1 x daily - 7 x weekly - 3 sets - 10 reps Kettlebell Deadlift - 1 x daily - 7 x weekly - 3 sets - 10 reps Standing Hip Abduction with Resistance at Ankles and Counter Support - 1 x daily - 7 x weekly - 3 sets - 10 reps

## 2020-07-15 NOTE — Therapy (Addendum)
[place], [place], [address] Phone: [phone]   Fax:  [phone]  Physical Therapy Treatment / Discharge  Patient Details  Name: Alexandra Young MRN: [id number] Date of Birth: [birth year] Referring Provider (PT): Nicholes Stairs, MD   Encounter Date: 07/15/2020   PT End of Session - 07/15/20 1115    Visit Number 8    Number of Visits 12    Date for PT Re-Evaluation 07/22/20    Authorization Type [employer]    Authorization Time Period 06/10/2020 - 08/09/2020    Authorization - Visit Number 6    Authorization - Number of Visits 8    PT Start Time 1108   Pt arrived late   PT Stop Time 1145    PT Time Calculation (min) 37 min    Activity Tolerance Patient tolerated treatment well;Patient limited by pain    Behavior During Therapy [facility] for tasks assessed/performed           Past Medical History:  Diagnosis Date  . Anxiety   . Asthma   . Depression   . IBS (irritable bowel syndrome)     History reviewed. No pertinent surgical history.  There were no vitals filed for this visit.   Subjective Assessment - 07/15/20 1108    Subjective Doing good today, some pain in the right knee but not bad    How long can you stand comfortably? 1-2 hours    Patient Stated Goals Go half the day without any pain (at least 8 hour shift); return to working out    Currently in Pain? Yes    Pain Score 4     Pain Location Knee    Pain Orientation Right    Pain Descriptors / Indicators Aching    Pain Type Chronic pain    Pain Onset More than a month ago    Aggravating Factors  stairs    Pain Relieving Factors medication and exercise              [facility] PT Assessment - 07/15/20 0001      Strength   Right Hip Flexion 5/5    Right Hip ABduction 4+/5    Left Hip Flexion 5/5    Left Hip ABduction 4+/5    Right Knee Flexion 5/5    Right Knee Extension 5/5    Left Knee Flexion 5/5    Left Knee Extension  5/5                         [facility] Adult PT Treatment/Exercise - 07/15/20 0001      Knee/Hip Exercises: Aerobic   Elliptical L1 x ramp L1 5 min       Knee/Hip Exercises: Machines for Strengthening   Hip Cybex Hip abd 25# to fatigue bil      Knee/Hip Exercises: Standing   Heel Raises 2 sets;20 reps    Forward Step Up Both;1 set;20 reps;Hand Hold: 0;Step Height: 8"   8" hip cybex platform   Step Down Both;20 reps;15 reps   8" hip cybex platform   Other Standing Knee Exercises Goblet squat and kettlebell squat 15# 20 reps each   Chair for TC; VC "knees over toes"   Other Standing Knee Exercises Deadlift progression 10#, 15#, 25#, 30#, 45#, 75# 5 reps each   KB 10-25 on 8" cybex step, all others to floor  PT Education - 07/15/20 1114    Education Details Difference between sore and pain (PNE)    Person(s) Educated Patient    Methods Explanation    Comprehension Verbalized understanding            PT Short Term Goals - 06/10/20 1422      PT SHORT TERM GOAL #1   Title Pt will be independent with initial HEP    Period Weeks    Status Achieved      PT SHORT TERM GOAL #2   Title Pt will report decrease in pain by at least 2 points at its worst    Period Weeks    Status Achieved      PT SHORT TERM GOAL #3   Title Pt will be able to squat x10 at least 10#s to demonstrate improved bilat hip/LE functional strength    Period Weeks    Status Achieved      PT SHORT TERM GOAL #4   Title Pt will be able to tolerate 4 hours of working her shift    Baseline 1-2  hours    Period Weeks    Status On-going             PT Long Term Goals - 06/10/20 1422      PT LONG TERM GOAL #1   Title Pt will be independent with advanced HEP to return to gym workouts    Baseline no previous HEP    Period Weeks    Status On-going    Target Date 07/22/20      PT LONG TERM GOAL #2   Title Pt will be able to tolerate an 8 hour shift without knee discomfort     Baseline continued pain/ discomfort with prolonged standing at work    Time 6    Period Weeks    Status New    Target Date 07/22/20      PT LONG TERM GOAL #3   Title Pt will be able to tolerate ascending/descend 10 steps without pain    Baseline Unable    Time 6    Period Weeks    Status On-going    Target Date 07/22/20                 Plan - 07/15/20 1115    Clinical Impression Statement Pt's strength was reassessed to have increased significantly with only minor pain report with abduction; pt was educated on difference between pain and muscle burn. Pt reports being able to work her three jobs and pick up 90#s regularly with no increase in pain over 4/10 which demonstrates progress with physical therapy. Today's session focused on progression of lower quarter strengthening exercises and cuing for proper form with weights to move toward independence with HEP and ability to progress exercises on their own in the gym. Pt's next session was scheduled out two weeks with progressed exercises to determine how they do and if they are ready for discharge as pt is making improvements in both pain and strength.    Personal Factors and Comorbidities Fitness;Time since onset of injury/illness/exacerbation    Examination-Activity Limitations Stairs;Stand    Examination-Participation Restrictions Occupation;Community Activity    Stability/Clinical Decision Making Stable/Uncomplicated    PT Treatment/Interventions ADLs/Self Care Home Management;Electrical Stimulation;Iontophoresis 4mg /ml Dexamethasone;Moist Heat;Traction;Ultrasound;Contrast Bath;Gait training;Stair training;Functional mobility training;Therapeutic activities;Therapeutic exercise;Neuromuscular re-education;Patient/family education;Orthotic Fit/Training;Manual techniques;Passive range of motion;Taping;Vasopneumatic Device;Vestibular    PT Next Visit Plan How was 2-week "real world test?", progress and finalize HEP for  independence,  possible discharge?    PT Home Exercise Plan [id number]: deadlift, goblet squat, standing hip ABD with band, heel raises with ball, forward step down    Consulted and Agree with Plan of Care Patient           Patient will benefit from skilled therapeutic intervention in order to improve the following deficits and impairments:  Improper body mechanics, Decreased activity tolerance, Decreased strength, Hypermobility  Visit Diagnosis: Chronic pain of left knee  Muscle weakness (generalized)  Chronic pain of right knee     Problem List There are no problems to display for this patient.   Dawayne Cirri, SPT 07/15/2020, 12:08 PM  [facility] [address] [place], [place], [address] Phone: [phone]   Fax:  [phone]  Name: Apryll Hinkle MRN: [id number] Date of Birth: October 25, 2000     PHYSICAL THERAPY DISCHARGE SUMMARY  Visits from Start of Care: 8  Current functional level related to goals / functional outcomes: See goals   Remaining deficits: Current status unknown   Education / Equipment: HEP, theraband, posture  Plan: Patient agrees to discharge.  Patient goals were partially met. Patient is being discharged due to not returning since the last visit.  ?????        Kristoffer Leamon PT, DPT, LAT, ATC  08/22/20  2:49 PM

## 2020-08-07 ENCOUNTER — Emergency Department (HOSPITAL_COMMUNITY)
Admission: EM | Admit: 2020-08-07 | Discharge: 2020-08-08 | Disposition: A | Discharge status: Home / Self Care | Payer: Medicaid Other | Attending: Emergency Medicine | Admitting: Emergency Medicine

## 2020-08-07 ENCOUNTER — Encounter (HOSPITAL_COMMUNITY): Payer: Self-pay | Admitting: Emergency Medicine

## 2020-08-07 DIAGNOSIS — J45909 Unspecified asthma, uncomplicated: Secondary | ICD-10-CM | POA: Diagnosis not present

## 2020-08-07 DIAGNOSIS — H538 Other visual disturbances: Secondary | ICD-10-CM | POA: Insufficient documentation

## 2020-08-07 NOTE — ED Triage Notes (Signed)
Pt c/o blurred vision x 4 days, no pain, no injury. Pt reports progressively worse. Pt does wear contacts.

## 2020-08-08 LAB — CBG MONITORING, ED: Glucose-Capillary: 75 mg/dL (ref 70–99)

## 2020-08-08 NOTE — ED Notes (Signed)
EDP at bedside  

## 2020-08-08 NOTE — Discharge Instructions (Signed)
You need to see the ophthalmologist in his office today. That is the only place he has the equipment necessary to evaluate your vision problem.

## 2020-08-08 NOTE — ED Provider Notes (Signed)
[facility] EMERGENCY DEPARTMENT Provider Note   CSN: [id number] Arrival date & time: 08/07/20  2122   History Chief Complaint  Patient presents with  . Blurred Vision    Alexandra Young is a 19 y.o. female.  The history is provided by the patient.  She has history of asthma, anxiety, depression, irritable bowel syndrome and comes in complaining of blurred vision for the last 4 days which is getting worse.  Blurred vision is bilateral but it is worse in her left eye.  She can see up close well but has difficulty with distance vision.  She denies any headache or eye pain.  She wears contacts and it is a new contact prescription.  Past Medical History:  Diagnosis Date  . Anxiety   . Asthma   . Depression   . IBS (irritable bowel syndrome)     There are no problems to display for this patient.   History reviewed. No pertinent surgical history.   OB History   No obstetric history on file.     Family History  Problem Relation Age of Onset  . Asthma Mother   . Thyroid disease Mother     Social History   Tobacco Use  . Smoking status: Never Smoker  . Smokeless tobacco: Never Used  Vaping Use  . Vaping Use: Never used  Substance Use Topics  . Alcohol use: Never  . Drug use: Never    Home Medications Prior to Admission medications   Medication Sig Start Date End Date Taking? Authorizing Provider  alum & mag hydroxide-simeth (MAALOX MULTI SYMPTOM MAX ST) 400-400-40 MG/5ML suspension Take 10 mLs by mouth every 6 (six) hours as needed for indigestion. 09/14/19   Wieters, Hallie C, PA-C  butalbital-acetaminophen-caffeine (FIORICET) 50-325-40 MG tablet Take by mouth. 07/02/19   [provider]  cefdinir (OMNICEF) 300 MG capsule Take 1 capsule (300 mg total) by mouth 2 (two) times daily. 05/20/20   Wallis Bamberg, PA-C  cetirizine (ZYRTEC) 10 MG tablet Take 10 mg by mouth daily.    [provider]  cholecalciferol (VITAMIN D3) 25 MCG (1000  UT) tablet Take 1,000 Units by mouth daily.    [provider]  dextromethorphan-guaiFENesin (MUCINEX DM) 30-600 MG 12hr tablet Take 1 tablet by mouth 2 (two) times daily. 09/14/19   Wieters, Hallie C, PA-C  fluconazole (DIFLUCAN) 150 MG tablet Take 1 tablet (150 mg total) by mouth once a week. 05/20/20   Wallis Bamberg, PA-C  linaclotide [facility]) 145 MCG CAPS capsule Take 145 mcg by mouth daily before breakfast.    [provider]  Menthol (CEPACOL SORE THROAT) 5.4 MG LOZG Use as directed 1 lozenge (5.4 mg total) in the mouth or throat every 2 (two) hours as needed. 03/15/20   Darr, Gerilyn Pilgrim, PA-C  montelukast (SINGULAIR) 10 MG tablet Take by mouth. 11/06/19   [provider]  naproxen (NAPROSYN) 500 MG tablet Take 1 tablet (500 mg total) by mouth 2 (two) times daily. 10/24/18   Carlyle Basques P, PA-C  omeprazole (PRILOSEC) 10 MG capsule Take 10 mg by mouth daily.    [provider]  polyethylene glycol (MIRALAX / GLYCOLAX) 17 g packet Take 17 g by mouth daily.    [provider]  predniSONE (DELTASONE) 20 MG tablet Take 2 tablets daily with breakfast. 05/20/20   Wallis Bamberg, PA-C  medroxyPROGESTERone (DEPO-PROVERA) 150 MG/ML injection Inject 150 mg into the muscle every 3 (three) months.  05/20/20  [provider]  Allergies    Patient has no known allergies.  Review of Systems   Review of Systems  All other systems reviewed and are negative.   Physical Exam Updated Vital Signs BP 130/81   Pulse (!) 112   Temp 98.7 F (37.1 C) (Oral)   Resp 18   SpO2 100%   Physical Exam Vitals and nursing note reviewed.   19 year old female, resting comfortably and in no acute distress. Vital signs are significant for mildly elevated heart rate. Oxygen saturation is 100%, which is normal. Head is normocephalic and atraumatic. PERRLA, EOMI. Oropharynx is clear.  Fundi are normal with sharp optic disks. Neck is nontender and supple without adenopathy or  JVD. Back is nontender and there is no CVA tenderness. Lungs are clear without rales, wheezes, or rhonchi. Chest is nontender. Heart has regular rate and rhythm without murmur. Abdomen is soft, flat, nontender without masses or hepatosplenomegaly and peristalsis is normoactive. Extremities have no cyanosis or edema, full range of motion is present. Skin is warm and dry without rash. Neurologic: Mental status is normal, cranial nerves are intact, there are no motor or sensory deficits.  ED Results / Procedures / Treatments   Labs (all labs ordered are listed, but only abnormal results are displayed) Labs Reviewed  CBG MONITORING, ED   Procedures Procedures  Medications Ordered in ED Medications - No data to display  ED Course  I have reviewed the triage vital signs and the nursing notes.  Pertinent lab results that were available during my care of the patient were reviewed by me and considered in my medical decision making (see chart for details).  MDM Rules/Calculators/A&P Blurred vision of uncertain cause.  Will check visual acuity and capillary blood glucose.  Old records are reviewed, and she has no relevant past visits.  Patient's mother has called and is concerned about the patient getting adequately evaluated.  There apparently is a significant family history of cerebral aneurysms and strokes, and she also was recently started on Vyvanse for her attention deficit disorder.  I have reviewed the literature for Vyvanse, and blurred vision is not listed among the side effects, but is listed as a symptom of overdose.  Glucose is normal.  Visual acuity is 20/50 in the right eye, 20/150 in the left eye.  This is much worse than would be expected with a relatively new prescription for contact lenses.  I have discussed her case with Dr. Ellard Artis, on-call for ophthalmology.  He states that patient needs to be sent to his office where he has proper equipment to do an evaluation.  He  specifically states that he cannot do an adequate evaluation in the emergency department.  Patient is advised of this and is advised to see him later today.  Final Clinical Impression(s) / ED Diagnoses Final diagnoses:  Blurred vision    Rx / DC Orders ED Discharge Orders    None       Dione Booze, MD 08/08/20 [phone]

## 2020-11-18 ENCOUNTER — Ambulatory Visit (HOSPITAL_COMMUNITY): Payer: Self-pay

## 2020-11-22 ENCOUNTER — Encounter (HOSPITAL_COMMUNITY): Payer: Self-pay

## 2020-11-22 ENCOUNTER — Other Ambulatory Visit: Payer: Self-pay

## 2020-11-22 ENCOUNTER — Ambulatory Visit (HOSPITAL_COMMUNITY)
Admission: RE | Admit: 2020-11-22 | Discharge: 2020-11-22 | Disposition: A | Discharge status: Home / Self Care | Payer: Medicaid Other | Source: Ambulatory Visit | Attending: Family Medicine | Admitting: Family Medicine

## 2020-11-22 VITALS — BP 109/76 | HR 84 | Temp 98.1°F | Resp 18

## 2020-11-22 DIAGNOSIS — Z711 Person with feared health complaint in whom no diagnosis is made: Secondary | ICD-10-CM | POA: Diagnosis present

## 2020-11-22 LAB — POCT URINALYSIS DIPSTICK, ED / UC
Bilirubin Urine: NEGATIVE
Glucose, UA: NEGATIVE mg/dL
Hgb urine dipstick: NEGATIVE
Ketones, ur: NEGATIVE mg/dL
Leukocytes,Ua: NEGATIVE
Nitrite: NEGATIVE
Protein, ur: NEGATIVE mg/dL
Specific Gravity, Urine: >=1.03 (ref 1.005–1.030)
Urobilinogen, UA: 0.2 mg/dL (ref 0.0–1.0)
pH: 5 (ref 5.0–8.0)

## 2020-11-22 LAB — POC URINE PREG, ED: Preg Test, Ur: NEGATIVE

## 2020-11-22 NOTE — ED Triage Notes (Signed)
Pt reports having exposure to a partner who has an STD. Pt states she does not have sxs.

## 2020-11-22 NOTE — Discharge Instructions (Addendum)
We will notify you if any treatment is warranted after your results your vaginal swab are available.  If everything is normal and no treatment is warranted you will not be notified of our clinic. Your urinalysis is normal.

## 2020-11-22 NOTE — ED Provider Notes (Signed)
[facility]    CSN: [id number] Arrival date & time: 11/22/20  9417      History   Chief Complaint Chief Complaint  Patient presents with  . Appointment  . Exposure to STD    HPI Alexandra Young is a 20 y.o. female.   HPI Patient presents today for STD testing. Patient is asymptomatic of any vaginal irritation or lesions. Reports a sexual partner from 6 months ago notified her of positive chlamydia test. Recently treated by PCP for yeast which has since resolved.   Past Medical History:  Diagnosis Date  . Anxiety   . Asthma   . Depression   . IBS (irritable bowel syndrome)     There are no problems to display for this patient.   History reviewed. No pertinent surgical history.  OB History   No obstetric history on file.      Home Medications    Prior to Admission medications   Medication Sig Start Date End Date Taking? Authorizing Provider  alum & mag hydroxide-simeth (MAALOX MULTI SYMPTOM MAX ST) 400-400-40 MG/5ML suspension Take 10 mLs by mouth every 6 (six) hours as needed for indigestion. 09/14/19   Wieters, Hallie C, PA-C  butalbital-acetaminophen-caffeine (FIORICET) 50-325-40 MG tablet Take by mouth. 07/02/19   [provider]  cetirizine (ZYRTEC) 10 MG tablet Take 10 mg by mouth daily.    [provider]  cholecalciferol (VITAMIN D3) 25 MCG (1000 UT) tablet Take 1,000 Units by mouth daily.    [provider]  dextromethorphan-guaiFENesin (MUCINEX DM) 30-600 MG 12hr tablet Take 1 tablet by mouth 2 (two) times daily. 09/14/19   Wieters, Hallie C, PA-C  fluconazole (DIFLUCAN) 150 MG tablet Take 1 tablet (150 mg total) by mouth once a week. 05/20/20   Wallis Bamberg, PA-C  linaclotide [facility])) 145 MCG CAPS capsule Take 145 mcg by mouth daily before breakfast.    [provider]  montelukast (SINGULAIR) 10 MG tablet Take by mouth. 11/06/19   [provider]  naproxen (NAPROSYN) 500 MG tablet Take 1 tablet (500 mg  total) by mouth 2 (two) times daily. 10/24/18   Carlyle Basques P, PA-C  omeprazole (PRILOSEC) 10 MG capsule Take 10 mg by mouth daily.    [provider]  polyethylene glycol (MIRALAX / GLYCOLAX) 17 g packet Take 17 g by mouth daily.    [provider]  medroxyPROGESTERone (DEPO-PROVERA) 150 MG/ML injection Inject 150 mg into the muscle every 3 (three) months.  05/20/20  [provider]    Family History Family History  Problem Relation Age of Onset  . Asthma Mother   . Thyroid disease Mother     Social History Social History   Tobacco Use  . Smoking status: Never Smoker  . Smokeless tobacco: Never Used  Vaping Use  . Vaping Use: Never used  Substance Use Topics  . Alcohol use: Never  . Drug use: Never     Allergies   Patient has no known allergies.   Review of Systems Review of Systems Pertinent negatives listed in HPI   Physical Exam Triage Vital Signs ED Triage Vitals  Enc Vitals Group     BP 11/22/20 0829 109/76     Pulse Rate 11/22/20 0829 84     Resp 11/22/20 0829 18     Temp 11/22/20 0829 98.1 F (36.7 C)     Temp Source 11/22/20 0829 Oral     SpO2 11/22/20 0829 100 %     Weight --  Height --      Head Circumference --      Peak Flow --      Pain Score 11/22/20 0828 0     Pain Loc --      Pain Edu? --      Excl. in GC? --    No data found.  Updated Vital Signs BP 109/76 (BP Location: Right Arm)   Pulse 84   Temp 98.1 F (36.7 C) (Oral)   Resp 18   LMP 11/08/2020   SpO2 100%   Visual Acuity Right Eye Distance:   Left Eye Distance:   Bilateral Distance:    Right Eye Near:   Left Eye Near:    Bilateral Near:     Physical Exam General appearance: alert, well developed, well nourished, cooperative and in no distress Head: Normocephalic, without obvious abnormality, atraumatic Respiratory: Respirations even and unlabored, normal respiratory rate Heart: Rate and Rhythm normal. No gallop or murmurs noted on  exam  Abdomen: BS +, no distention, no rebound tenderness, or no mass Extremities: No gross deformities Skin: Skin color, texture, turgor normal. No rashes seen  Psych: Appropriate mood and affect. Vaginal Cytology self collected  UC Treatments / Results  Labs (all labs ordered are listed, but only abnormal results are displayed) Labs Reviewed  POCT URINALYSIS DIPSTICK, ED / UC  POC URINE PREG, ED  CERVICOVAGINAL ANCILLARY ONLY    EKG   Radiology No results found.  Procedures Procedures (including critical care time)  Medications Ordered in UC Medications - No data to display  Initial Impression / Assessment and Plan / UC Course  I have reviewed the triage vital signs and the nursing notes.  Pertinent labs & imaging results that were available during my care of the patient were reviewed by me and considered in my medical decision making (see chart for details).      STD testing pending.  Patient notified if any results are abnormal she will be notified via phone.  If all results are normal no contact will be made from our office however her results will be available for review on my chart. Final Clinical Impressions(s) / UC Diagnoses   Final diagnoses:  Concern about STD in female without diagnosis     Discharge Instructions     We will notify you if any treatment is warranted after your results your vaginal swab are available.  If everything is normal and no treatment is warranted you will not be notified of our clinic. Your urinalysis is normal.    ED Prescriptions    None     PDMP not reviewed this encounter.   Bing Neighbors, FNP 11/22/20 [phone]

## 2020-11-23 ENCOUNTER — Telehealth (HOSPITAL_COMMUNITY): Payer: Self-pay | Admitting: Emergency Medicine

## 2020-11-23 LAB — CERVICOVAGINAL ANCILLARY ONLY
Bacterial Vaginitis (gardnerella): POSITIVE — AB
Candida Glabrata: NEGATIVE
Candida Vaginitis: NEGATIVE
Chlamydia: POSITIVE — AB
Comment: NEGATIVE
Comment: NEGATIVE
Comment: NEGATIVE
Comment: NEGATIVE
Comment: NEGATIVE
Comment: NORMAL
Neisseria Gonorrhea: NEGATIVE
Trichomonas: NEGATIVE

## 2020-11-23 MED ORDER — DOXYCYCLINE HYCLATE 100 MG PO CAPS: 100.0000 mg | ORAL_CAPSULE | Freq: Two times a day (BID) | ORAL | 0 refills | Status: AC

## 2020-11-23 MED ORDER — METRONIDAZOLE 500 MG PO TABS: 500.0000 mg | ORAL_TABLET | Freq: Two times a day (BID) | ORAL | 0 refills | Status: DC

## 2021-01-04 ENCOUNTER — Emergency Department (HOSPITAL_COMMUNITY)
Admission: EM | Admit: 2021-01-04 | Discharge: 2021-01-04 | Disposition: A | Discharge status: Home / Self Care | Payer: Medicaid Other | Attending: Emergency Medicine | Admitting: Emergency Medicine

## 2021-01-04 ENCOUNTER — Emergency Department (HOSPITAL_COMMUNITY): Payer: Medicaid Other

## 2021-01-04 ENCOUNTER — Encounter (HOSPITAL_COMMUNITY): Payer: Self-pay

## 2021-01-04 DIAGNOSIS — R10A0 Flank pain, unspecified side: Secondary | ICD-10-CM

## 2021-01-04 DIAGNOSIS — J45909 Unspecified asthma, uncomplicated: Secondary | ICD-10-CM | POA: Insufficient documentation

## 2021-01-04 DIAGNOSIS — N83202 Unspecified ovarian cyst, left side: Secondary | ICD-10-CM

## 2021-01-04 DIAGNOSIS — Z20822 Contact with and (suspected) exposure to covid-19: Secondary | ICD-10-CM | POA: Insufficient documentation

## 2021-01-04 DIAGNOSIS — R112 Nausea with vomiting, unspecified: Secondary | ICD-10-CM | POA: Diagnosis not present

## 2021-01-04 DIAGNOSIS — R109 Unspecified abdominal pain: Secondary | ICD-10-CM | POA: Diagnosis present

## 2021-01-04 DIAGNOSIS — N83201 Unspecified ovarian cyst, right side: Secondary | ICD-10-CM | POA: Diagnosis not present

## 2021-01-04 DIAGNOSIS — R000 Tachycardia, unspecified: Secondary | ICD-10-CM | POA: Diagnosis not present

## 2021-01-04 LAB — URINALYSIS, ROUTINE W REFLEX MICROSCOPIC
Bilirubin Urine: NEGATIVE
Glucose, UA: NEGATIVE mg/dL
Ketones, ur: 80 mg/dL — AB
Leukocytes,Ua: NEGATIVE
Nitrite: NEGATIVE
Protein, ur: 30 mg/dL — AB
Specific Gravity, Urine: 1.024 (ref 1.005–1.030)
pH: 8 (ref 5.0–8.0)

## 2021-01-04 LAB — CBC WITH DIFFERENTIAL/PLATELET
Abs Immature Granulocytes: 0.04 10*3/uL (ref 0.00–0.07)
Basophils Absolute: 0.1 10*3/uL (ref 0.0–0.1)
Basophils Relative: 1 %
Eosinophils Absolute: 0.5 10*3/uL (ref 0.0–0.5)
Eosinophils Relative: 6 %
HCT: 40.6 % (ref 36.0–46.0)
Hemoglobin: 13.4 g/dL (ref 12.0–15.0)
Immature Granulocytes: 1 %
Lymphocytes Relative: 12 %
Lymphs Abs: 0.9 10*3/uL (ref 0.7–4.0)
MCH: 28.5 pg (ref 26.0–34.0)
MCHC: 33 g/dL (ref 30.0–36.0)
MCV: 86.2 fL (ref 80.0–100.0)
Monocytes Absolute: 0.8 10*3/uL (ref 0.1–1.0)
Monocytes Relative: 10 %
Neutro Abs: 5.6 10*3/uL (ref 1.7–7.7)
Neutrophils Relative %: 70 %
Platelets: 233 10*3/uL (ref 150–400)
RBC: 4.71 MIL/uL (ref 3.87–5.11)
RDW: 13.2 % (ref 11.5–15.5)
WBC: 7.9 10*3/uL (ref 4.0–10.5)
nRBC: 0 % (ref 0.0–0.2)

## 2021-01-04 LAB — RESP PANEL BY RT-PCR (FLU A&B, COVID) ARPGX2
Influenza A by PCR: NEGATIVE
Influenza B by PCR: NEGATIVE
SARS Coronavirus 2 by RT PCR: NEGATIVE

## 2021-01-04 LAB — COMPREHENSIVE METABOLIC PANEL
ALT: 27 U/L (ref 0–44)
AST: 23 U/L (ref 15–41)
Albumin: 4.7 g/dL (ref 3.5–5.0)
Alkaline Phosphatase: 45 U/L (ref 38–126)
Anion gap: 10 (ref 5–15)
BUN: 9 mg/dL (ref 6–20)
CO2: 22 mmol/L (ref 22–32)
Calcium: 9.2 mg/dL (ref 8.9–10.3)
Chloride: 107 mmol/L (ref 98–111)
Creatinine, Ser: 0.69 mg/dL (ref 0.44–1.00)
GFR, Estimated: >60 mL/min (ref >60)
Glucose, Bld: 92 mg/dL (ref 70–99)
Potassium: 3.5 mmol/L (ref 3.5–5.1)
Sodium: 139 mmol/L (ref 135–145)
Total Bilirubin: 0.8 mg/dL (ref 0.3–1.2)
Total Protein: 8.1 g/dL (ref 6.5–8.1)

## 2021-01-04 LAB — WET PREP, GENITAL
Sperm: NONE SEEN
Trich, Wet Prep: NONE SEEN
Yeast Wet Prep HPF POC: NONE SEEN

## 2021-01-04 LAB — PREGNANCY, URINE: Preg Test, Ur: NEGATIVE

## 2021-01-04 MED ORDER — ONDANSETRON HCL 4 MG/2ML IJ SOLN
4.0000 mg | Freq: Once | INTRAMUSCULAR | Status: AC
  Administered 2021-01-04: 4 mg via INTRAVENOUS
  Filled 2021-01-04: qty 2

## 2021-01-04 MED ORDER — ONDANSETRON HCL 4 MG PO TABS: 4.0000 mg | ORAL_TABLET | Freq: Three times a day (TID) | ORAL | 0 refills | Status: DC | PRN

## 2021-01-04 MED ORDER — IBUPROFEN 600 MG PO TABS: 600.0000 mg | ORAL_TABLET | Freq: Four times a day (QID) | ORAL | 0 refills | Status: DC | PRN

## 2021-01-04 MED ORDER — ONDANSETRON 4 MG PO TBDP
4.0000 mg | ORAL_TABLET | Freq: Once | ORAL | Status: AC
  Administered 2021-01-04: 4 mg via ORAL
  Filled 2021-01-04: qty 1

## 2021-01-04 MED ORDER — MORPHINE SULFATE (PF) 4 MG/ML IV SOLN
4.0000 mg | Freq: Once | INTRAVENOUS | Status: AC
  Administered 2021-01-04: 4 mg via INTRAVENOUS
  Filled 2021-01-04: qty 1

## 2021-01-04 MED ORDER — ACETAMINOPHEN 500 MG PO TABS
1000.0000 mg | ORAL_TABLET | Freq: Once | ORAL | Status: AC
  Administered 2021-01-04: 1000 mg via ORAL
  Filled 2021-01-04: qty 2

## 2021-01-04 MED ORDER — SODIUM CHLORIDE 0.9 % IV BOLUS
1000.0000 mL | Freq: Once | INTRAVENOUS | Status: AC
  Administered 2021-01-04: 1000 mL via INTRAVENOUS

## 2021-01-04 MED ORDER — KETOROLAC TROMETHAMINE 30 MG/ML IJ SOLN
30.0000 mg | Freq: Once | INTRAMUSCULAR | Status: AC
  Administered 2021-01-04: 30 mg via INTRAVENOUS
  Filled 2021-01-04: qty 1

## 2021-01-04 MED ORDER — SODIUM CHLORIDE 0.9 % IV BOLUS: 1000.0000 mL | Freq: Once | INTRAVENOUS | Status: DC

## 2021-01-04 NOTE — ED Triage Notes (Signed)
Pt arrived via walk in, c/o left sided back/flank pain x2 days. Worsening today. Denies any urinary sx.

## 2021-01-04 NOTE — Discharge Instructions (Signed)
Your pain discomfort may be due to a ruptured ovarian cyst.  Fortunately your CT scan did not show any concerning finding and your labs are reassuring.  You are dehydrated therefore please stay hydrated by drinking plenty of fluid and get plenty of rest.  If you develop fever worsening pain or if you have any concern do not hesitate to return to the ER for further evaluation.

## 2021-01-04 NOTE — ED Notes (Signed)
Pt verbalized understanding of d/c, medication, and follow up care. Ambulatory with steady gait.  

## 2021-01-04 NOTE — ED Provider Notes (Signed)
[facility]-EMERGENCY DEPT Provider Note   CSN: [id number] Arrival date & [id number]  1348     History Chief Complaint  Patient presents with  . Flank Pain    Alexandra Young is a 20 y.o. female.  The history is provided by the patient. No language interpreter was used.  Flank Pain      21 year old female significant history of IBS who presents for evaluation of flank pain.  Patient reports for the past 3 days she has been having recurrent pain to her left flank.  Pain described as a burning aching stabbing pain that has become progressively more intense and now associate with a couple episodes of nausea and vomiting and having fever and chills.  Pain mildly improved when she lays on the unaffected side.  No associated chest pain shortness of breath productive cough dysuria hematuria vaginal bleeding or vaginal discharge.  Last menstrual period was approximately 3 months ago.  Patient does have a Nexplanon and this is not unusual.  She denies any sexual activities.  She denies any bowel or bladder changes.  She does not have any prior history of kidney stone.  She has been taking Excedrin medication at home without adequate relief.  Past Medical History:  Diagnosis Date  . Anxiety   . Asthma   . Depression   . IBS (irritable bowel syndrome)     There are no problems to display for this patient.   History reviewed. No pertinent surgical history.   OB History   No obstetric history on file.     Family History  Problem Relation Age of Onset  . Asthma Mother   . Thyroid disease Mother     Social History   Tobacco Use  . Smoking status: Never Smoker  . Smokeless tobacco: Never Used  Vaping Use  . Vaping Use: Never used  Substance Use Topics  . Alcohol use: Never  . Drug use: Never    Home Medications Prior to Admission medications   Medication Sig Start Date End Date Taking? Authorizing Provider  alum & mag hydroxide-simeth (MAALOX MULTI  SYMPTOM MAX ST) 400-400-40 MG/5ML suspension Take 10 mLs by mouth every 6 (six) hours as needed for indigestion. 09/14/19   Wieters, Hallie C, PA-C  butalbital-acetaminophen-caffeine (FIORICET) 50-325-40 MG tablet Take by mouth. 07/02/19   [provider]  cetirizine (ZYRTEC) 10 MG tablet Take 10 mg by mouth daily.    [provider]  cholecalciferol (VITAMIN D3) 25 MCG (1000 UT) tablet Take 1,000 Units by mouth daily.    [provider]  dextromethorphan-guaiFENesin (MUCINEX DM) 30-600 MG 12hr tablet Take 1 tablet by mouth 2 (two) times daily. 09/14/19   Wieters, Hallie C, PA-C  fluconazole (DIFLUCAN) 150 MG tablet Take 1 tablet (150 mg total) by mouth once a week. 05/20/20   Wallis Bamberg, PA-C  linaclotide [facility]) 145 MCG CAPS capsule Take 145 mcg by mouth daily before breakfast.    [provider]  metroNIDAZOLE (FLAGYL) 500 MG tablet Take 1 tablet (500 mg total) by mouth 2 (two) times daily. 11/23/20   Merrilee Jansky, MD  montelukast (SINGULAIR) 10 MG tablet Take by mouth. 11/06/19   [provider]  naproxen (NAPROSYN) 500 MG tablet Take 1 tablet (500 mg total) by mouth 2 (two) times daily. 10/24/18   Carlyle Basques P, PA-C  omeprazole (PRILOSEC) 10 MG capsule Take 10 mg by mouth daily.    [provider]  polyethylene glycol (MIRALAX / GLYCOLAX)  17 g packet Take 17 g by mouth daily.    [provider]  medroxyPROGESTERone (DEPO-PROVERA) 150 MG/ML injection Inject 150 mg into the muscle every 3 (three) months.  05/20/20  [provider]    Allergies    Patient has no known allergies.  Review of Systems   Review of Systems  Genitourinary: Positive for flank pain.  All other systems reviewed and are negative.   Physical Exam Updated Vital Signs BP 127/79   Pulse (!) 118   Temp 99.4 F (37.4 C) (Oral)   Resp 18   Ht 5\' 2"  (1.575 m)   Wt 61.2 kg   SpO2 100%   BMI 24.69 kg/m   Physical Exam Vitals and nursing  note reviewed.  Constitutional:      Appearance: She is well-developed.     Comments: Patient appears uncomfortable  HENT:     Head: Atraumatic.  Eyes:     Conjunctiva/sclera: Conjunctivae normal.  Cardiovascular:     Rate and Rhythm: Tachycardia present.     Pulses: Normal pulses.     Heart sounds: Normal heart sounds.  Pulmonary:     Effort: Pulmonary effort is normal.     Breath sounds: Normal breath sounds. No stridor. No wheezing, rhonchi or rales.  Abdominal:     Tenderness: There is abdominal tenderness. There is left CVA tenderness. There is no right CVA tenderness.  Musculoskeletal:        General: Normal range of motion.     Cervical back: Neck supple.  Skin:    Findings: No rash.  Neurological:     Mental Status: She is alert. Mental status is at baseline.  Psychiatric:        Mood and Affect: Mood normal.     ED Results / Procedures / Treatments   Labs (all labs ordered are listed, but only abnormal results are displayed) Labs Reviewed  WET PREP, GENITAL - Abnormal; Notable for the following components:      Result Value   Clue Cells Wet Prep HPF POC PRESENT (*)    WBC, Wet Prep HPF POC MANY (*)    All other components within normal limits  URINALYSIS, ROUTINE W REFLEX MICROSCOPIC - Abnormal; Notable for the following components:   APPearance HAZY (*)    Hgb urine dipstick SMALL (*)    Ketones, ur 80 (*)    Protein, ur 30 (*)    Bacteria, UA RARE (*)    All other components within normal limits  RESP PANEL BY RT-PCR (FLU A&B, COVID) ARPGX2  COMPREHENSIVE METABOLIC PANEL  CBC WITH DIFFERENTIAL/PLATELET  PREGNANCY, URINE  GC/CHLAMYDIA PROBE AMP ([place]) NOT AT [facility]    EKG None  Radiology [facility] Pelvis Complete  Result Date: 01/04/2021 CLINICAL DATA:  Initial evaluation for acute left pelvic pain, evaluate for torsion. EXAM: TRANSABDOMINAL ULTRASOUND OF PELVIS DOPPLER ULTRASOUND OF OVARIES TECHNIQUE: Transabdominal ultrasound examination of the  pelvis was performed including evaluation of the uterus, ovaries, adnexal regions, and pelvic cul-de-sac. Color and duplex Doppler ultrasound was utilized to evaluate blood flow to the ovaries. COMPARISON:  Prior CT from earlier the same day. FINDINGS: Uterus Measurements: 6.1 x 2.9 x 4.0 cm = volume: 37.7 mL. Uterus is retroverted. No discrete fibroid or other mass. Endometrium Thickness: 5.0 mm.  No focal abnormality visualized. Right ovary Measurements: 3.1 x 1.5 x 2.2 cm = volume: 5.4 mL. Normal appearance/no adnexal mass. Left ovary Measurements: 3.4 x 2.2 x 2.7 cm = volume: 11.3 mL.  Normal appearance/no adnexal mass. 1.8 cm dominant follicle noted. Pulsed Doppler evaluation demonstrates normal low-resistance arterial and venous waveforms in both ovaries. Other: Trace free fluid within the pelvis, likely physiologic. IMPRESSION: 1. 1.8 cm left ovarian dominant follicle, which could contribute to left-sided pelvic pain. Associated trace free fluid within the pelvis. 2. Otherwise unremarkable and normal pelvic ultrasound. No evidence for ovarian torsion. Electronically Signed   By: Rise Mu M.D.   On: 01/04/2021 19:58   [place] Art/Ven Flow Abd Pelv Doppler  Result Date: 01/04/2021 CLINICAL DATA:  Initial evaluation for acute left pelvic pain, evaluate for torsion. EXAM: TRANSABDOMINAL ULTRASOUND OF PELVIS DOPPLER ULTRASOUND OF OVARIES TECHNIQUE: Transabdominal ultrasound examination of the pelvis was performed including evaluation of the uterus, ovaries, adnexal regions, and pelvic cul-de-sac. Color and duplex Doppler ultrasound was utilized to evaluate blood flow to the ovaries. COMPARISON:  Prior CT from earlier the same day. FINDINGS: Uterus Measurements: 6.1 x 2.9 x 4.0 cm = volume: 37.7 mL. Uterus is retroverted. No discrete fibroid or other mass. Endometrium Thickness: 5.0 mm.  No focal abnormality visualized. Right ovary Measurements: 3.1 x 1.5 x 2.2 cm = volume: 5.4 mL. Normal appearance/no  adnexal mass. Left ovary Measurements: 3.4 x 2.2 x 2.7 cm = volume: 11.3 mL. Normal appearance/no adnexal mass. 1.8 cm dominant follicle noted. Pulsed Doppler evaluation demonstrates normal low-resistance arterial and venous waveforms in both ovaries. Other: Trace free fluid within the pelvis, likely physiologic. IMPRESSION: 1. 1.8 cm left ovarian dominant follicle, which could contribute to left-sided pelvic pain. Associated trace free fluid within the pelvis. 2. Otherwise unremarkable and normal pelvic ultrasound. No evidence for ovarian torsion. Electronically Signed   By: Rise Mu M.D.   On: 01/04/2021 19:58   CT Renal Stone Study  Result Date: 01/04/2021 CLINICAL DATA:  Two days of left-sided flank pain, kidney stone suspected EXAM: CT ABDOMEN AND PELVIS WITHOUT CONTRAST TECHNIQUE: Multidetector CT imaging of the abdomen and pelvis was performed following the standard protocol without IV contrast. COMPARISON:  None. FINDINGS: Lower chest: No acute abnormality. Normal size heart. No significant pericardial effusion/thickening. Hepatobiliary: Unremarkable noncontrast appearance of the hepatic parenchyma. Gallbladder is grossly unremarkable. No biliary ductal dilation. Pancreas: Within normal limits. Spleen: Within normal limits Adrenals/Urinary Tract: Adrenal glands are unremarkable. No renal, ureteral or bladder calculus visualized. Kidneys are normal, contour deforming lesion, or hydronephrosis. Bladder is unremarkable for degree of distension. Stomach/Bowel: Stomach is within normal limits. Appendix is not discretely visualized however there is no pericecal inflammation. No evidence of bowel wall thickening, distention, or inflammatory changes. Vascular/Lymphatic: No significant vascular findings are present. No enlarged abdominal or pelvic lymph nodes. Reproductive: Uterus and right adnexa are unremarkable. 2.3 cm left adnexal cyst. No follow-up imaging recommended. Note: This recommendation  does not apply to premenarchal patients and to those with increased risk (genetic, family history, elevated tumor markers or other high-risk factors) of ovarian cancer. Reference: JACR 2020 Feb; 17(2):248-254 Other: Trace pelvic free fluid, likely physiologic. Musculoskeletal: No acute or significant osseous findings. IMPRESSION: No acute abdominopelvic findings. Specifically, no evidence of obstructive uropathy. Electronically Signed   By: Maudry Mayhew MD   On: 01/04/2021 17:29    Procedures Pelvic exam  Date/Time: 01/04/2021 7:13 PM Performed by: Fayrene Helper, PA-C Authorized by: Fayrene Helper, PA-C  Patient tolerance: patient tolerated the procedure well with no immediate complications Comments: Pelvic exam performed with permission of pt and female ED tech assist during exam.  External genitalia w/out lesions.  Vaginal vault with normal  functional discharge.  Cervix w/out lesions, not friable, GC/Chlamydia and wet prep obtained and sent to lab.  Bimanual exam w/out CMT, but does have L adnexal tenderness      Medications Ordered in ED Medications  ondansetron (ZOFRAN-ODT) disintegrating tablet 4 mg (4 mg Oral Given 01/04/21 1434)  morphine 4 MG/ML injection 4 mg (4 mg Intravenous Given 01/04/21 1548)  ondansetron (ZOFRAN) injection 4 mg (4 mg Intravenous Given 01/04/21 1548)  sodium chloride 0.9 % bolus 1,000 mL (0 mLs Intravenous Stopped 01/04/21 1801)  morphine 4 MG/ML injection 4 mg (4 mg Intravenous Given 01/04/21 1922)  acetaminophen (TYLENOL) tablet 1,000 mg (1,000 mg Oral Given 01/04/21 1945)    ED Course  I have reviewed the triage vital signs and the nursing notes.  Pertinent labs & imaging results that were available during my care of the patient were reviewed by me and considered in my medical decision making (see chart for details).    MDM Rules/Calculators/A&P                          BP 113/79   Pulse (!) 119   Temp 98.6 F (37 C) (Oral)   Resp 18   Ht 5\' 2"  (1.575 m)    Wt 61.2 kg   SpO2 98%   BMI 24.69 kg/m   Final Clinical Impression(s) / ED Diagnoses Final diagnoses:  Cyst of left ovary  Flank pain    Rx / DC Orders ED Discharge Orders         Ordered    ibuprofen (ADVIL) 600 MG tablet  Every 6 hours PRN        01/04/21 2153    ondansetron (ZOFRAN) 4 MG tablet  Every 8 hours PRN        01/04/21 2153         Patient here with left flank pain. She appears uncomfortable.  No prior history of kidney stone.  Will provide symptomatic treatment and work-up initiated.  6:53 PM Work-up is essentially unremarkable, labs are reassuring, UA does shows 80 of ketones suggests itself dehydration.  Patient received IV fluid.  She was tachycardic but did improve a bit.  Patient however still endorse moderate amount of pain.  CT scan of the abdomen pelvis even though without any acute finding it did mention of a 2.3 cm of left adnexal cyst as well as some trace pelvic free fluid.  I suspect her pain could be due to a ruptured ovarian cyst however after discussion with patient she would like to have an ultrasound to rule out ovarian torsion.  This is reasonable.  Will provide additional symptom control and will obtain ultrasound.  8:24 PM Wet prep shows many WBC and presence of clue cells.  Pelvic ultrasound demonstrating a 1.8 cm left ovarian dominant follicle which could contribute to left-sided pelvic pain with trace free fluid within the pelvis.  No evidence of ovarian torsion.  I discussed this finding with patient.  Patient states she has not been sexually active for the past 7 months therefore I have low suspicion for PID causing her symptoms.  She is able to ambulate.  She still has some discomfort.  8:40 PM Patient still a bit tachycardic and hypertensive.  She is afebrile.  Will provide additional IV fluid, Toradol for pain, will continue to monitor.  9:48 PM After receiving a second bolus of fluid, blood pressure as well as heart rate steadily  improved patient felt  better.  At this time she is stable for discharge.  Recommend well hydration at home, return precaution given.   Fayrene Helper, PA-C 01/04/21 2154    Charlynne Pander, MD 01/04/21 [phone]

## 2021-01-04 NOTE — ED Triage Notes (Signed)
Emergency Medicine Provider Triage Evaluation Note  Melek Pownall , a 20 y.o. female  was evaluated in triage.  Pt complains of flank pain on L side and vomiting for three days.  Review of Systems  Positive: Flank pain, vomiting  Negative: Fever, dysuria   Physical Exam  There were no vitals taken for this visit. Gen:   Awake, no distress HEENT:  Atraumatic  Resp:  Normal effort  Cardiac:  Normal rate  Abd:   Nondistended, nontender  MSK:   Moves extremities without difficulty  Neuro:  Speech clear   Medical Decision Making  Medically screening exam initiated at 2:24 PM.  Appropriate orders placed.  Leighton Luster was informed that the remainder of the evaluation will be completed by another provider, this initial triage assessment does not replace that evaluation, and the importance of remaining in the ED until their evaluation is complete.  Clinical Impression  Stable  MSE was initiated and I personally evaluated the patient and placed orders (if any) at  2:25 PM on January 04, 2021.  The patient appears stable so that the remainder of the MSE may be completed by another provider.    Farrel Gordon, PA-C 01/04/21 1426

## 2021-01-05 LAB — GC/CHLAMYDIA PROBE AMP (~~LOC~~) NOT AT ARMC
Chlamydia: NEGATIVE
Comment: NEGATIVE
Comment: NORMAL
Neisseria Gonorrhea: NEGATIVE

## 2021-01-07 ENCOUNTER — Emergency Department (HOSPITAL_COMMUNITY): Payer: Medicaid Other

## 2021-01-07 ENCOUNTER — Other Ambulatory Visit: Payer: Self-pay

## 2021-01-07 ENCOUNTER — Inpatient Hospital Stay (HOSPITAL_COMMUNITY)
Admission: EM | Admit: 2021-01-07 | Discharge: 2021-01-21 | DRG: 815 | Disposition: A | Discharge status: Home / Self Care | Payer: Medicaid Other | Attending: Family Medicine | Admitting: Family Medicine

## 2021-01-07 ENCOUNTER — Encounter (HOSPITAL_COMMUNITY): Payer: Self-pay

## 2021-01-07 DIAGNOSIS — R0789 Other chest pain: Secondary | ICD-10-CM | POA: Diagnosis not present

## 2021-01-07 DIAGNOSIS — R0602 Shortness of breath: Secondary | ICD-10-CM

## 2021-01-07 DIAGNOSIS — R10A2 Flank pain, left side: Secondary | ICD-10-CM

## 2021-01-07 DIAGNOSIS — R1013 Epigastric pain: Secondary | ICD-10-CM

## 2021-01-07 DIAGNOSIS — Z806 Family history of leukemia: Secondary | ICD-10-CM

## 2021-01-07 DIAGNOSIS — K589 Irritable bowel syndrome without diarrhea: Secondary | ICD-10-CM | POA: Diagnosis present

## 2021-01-07 DIAGNOSIS — R7401 Elevation of levels of liver transaminase levels: Secondary | ICD-10-CM

## 2021-01-07 DIAGNOSIS — R102 Pelvic and perineal pain: Secondary | ICD-10-CM

## 2021-01-07 DIAGNOSIS — F988 Other specified behavioral and emotional disorders with onset usually occurring in childhood and adolescence: Secondary | ICD-10-CM

## 2021-01-07 DIAGNOSIS — Z818 Family history of other mental and behavioral disorders: Secondary | ICD-10-CM

## 2021-01-07 DIAGNOSIS — R7989 Other specified abnormal findings of blood chemistry: Secondary | ICD-10-CM

## 2021-01-07 DIAGNOSIS — L299 Pruritus, unspecified: Secondary | ICD-10-CM | POA: Diagnosis present

## 2021-01-07 DIAGNOSIS — T380X5A Adverse effect of glucocorticoids and synthetic analogues, initial encounter: Secondary | ICD-10-CM | POA: Diagnosis not present

## 2021-01-07 DIAGNOSIS — F3160 Bipolar disorder, current episode mixed, unspecified: Secondary | ICD-10-CM | POA: Diagnosis present

## 2021-01-07 DIAGNOSIS — R1032 Left lower quadrant pain: Secondary | ICD-10-CM | POA: Diagnosis present

## 2021-01-07 DIAGNOSIS — R519 Headache, unspecified: Secondary | ICD-10-CM | POA: Diagnosis present

## 2021-01-07 DIAGNOSIS — K7689 Other specified diseases of liver: Secondary | ICD-10-CM | POA: Diagnosis present

## 2021-01-07 DIAGNOSIS — D75839 Thrombocytosis, unspecified: Secondary | ICD-10-CM | POA: Diagnosis not present

## 2021-01-07 DIAGNOSIS — Z8349 Family history of other endocrine, nutritional and metabolic diseases: Secondary | ICD-10-CM

## 2021-01-07 DIAGNOSIS — R112 Nausea with vomiting, unspecified: Secondary | ICD-10-CM | POA: Diagnosis present

## 2021-01-07 DIAGNOSIS — R7881 Bacteremia: Secondary | ICD-10-CM | POA: Diagnosis present

## 2021-01-07 DIAGNOSIS — R739 Hyperglycemia, unspecified: Secondary | ICD-10-CM | POA: Diagnosis not present

## 2021-01-07 DIAGNOSIS — R21 Rash and other nonspecific skin eruption: Secondary | ICD-10-CM | POA: Diagnosis not present

## 2021-01-07 DIAGNOSIS — E876 Hypokalemia: Secondary | ICD-10-CM | POA: Diagnosis present

## 2021-01-07 DIAGNOSIS — D7212 Drug rash with eosinophilia and systemic symptoms syndrome: Principal | ICD-10-CM | POA: Diagnosis present

## 2021-01-07 DIAGNOSIS — R079 Chest pain, unspecified: Secondary | ICD-10-CM

## 2021-01-07 DIAGNOSIS — R000 Tachycardia, unspecified: Secondary | ICD-10-CM | POA: Diagnosis not present

## 2021-01-07 DIAGNOSIS — I079 Rheumatic tricuspid valve disease, unspecified: Secondary | ICD-10-CM | POA: Diagnosis present

## 2021-01-07 DIAGNOSIS — R109 Unspecified abdominal pain: Secondary | ICD-10-CM

## 2021-01-07 DIAGNOSIS — J45909 Unspecified asthma, uncomplicated: Secondary | ICD-10-CM | POA: Diagnosis present

## 2021-01-07 DIAGNOSIS — R590 Localized enlarged lymph nodes: Secondary | ICD-10-CM | POA: Diagnosis present

## 2021-01-07 DIAGNOSIS — N83202 Unspecified ovarian cyst, left side: Secondary | ICD-10-CM | POA: Diagnosis present

## 2021-01-07 DIAGNOSIS — Z8619 Personal history of other infectious and parasitic diseases: Secondary | ICD-10-CM

## 2021-01-07 DIAGNOSIS — I82613 Acute embolism and thrombosis of superficial veins of upper extremity, bilateral: Secondary | ICD-10-CM | POA: Diagnosis present

## 2021-01-07 DIAGNOSIS — R509 Fever, unspecified: Secondary | ICD-10-CM

## 2021-01-07 DIAGNOSIS — R161 Splenomegaly, not elsewhere classified: Secondary | ICD-10-CM | POA: Diagnosis present

## 2021-01-07 DIAGNOSIS — R7303 Prediabetes: Secondary | ICD-10-CM | POA: Diagnosis not present

## 2021-01-07 DIAGNOSIS — F410 Panic disorder [episodic paroxysmal anxiety] without agoraphobia: Secondary | ICD-10-CM | POA: Diagnosis present

## 2021-01-07 DIAGNOSIS — F909 Attention-deficit hyperactivity disorder, unspecified type: Secondary | ICD-10-CM | POA: Diagnosis present

## 2021-01-07 DIAGNOSIS — N760 Acute vaginitis: Secondary | ICD-10-CM | POA: Diagnosis present

## 2021-01-07 DIAGNOSIS — T50905A Adverse effect of unspecified drugs, medicaments and biological substances, initial encounter: Secondary | ICD-10-CM

## 2021-01-07 DIAGNOSIS — B379 Candidiasis, unspecified: Secondary | ICD-10-CM | POA: Diagnosis present

## 2021-01-07 DIAGNOSIS — D72829 Elevated white blood cell count, unspecified: Secondary | ICD-10-CM

## 2021-01-07 DIAGNOSIS — R5381 Other malaise: Secondary | ICD-10-CM | POA: Diagnosis present

## 2021-01-07 DIAGNOSIS — R52 Pain, unspecified: Secondary | ICD-10-CM

## 2021-01-07 DIAGNOSIS — T426X5A Adverse effect of other antiepileptic and sedative-hypnotic drugs, initial encounter: Secondary | ICD-10-CM | POA: Diagnosis present

## 2021-01-07 DIAGNOSIS — Z825 Family history of asthma and other chronic lower respiratory diseases: Secondary | ICD-10-CM

## 2021-01-07 DIAGNOSIS — Z20822 Contact with and (suspected) exposure to covid-19: Secondary | ICD-10-CM | POA: Diagnosis present

## 2021-01-07 LAB — URINALYSIS, ROUTINE W REFLEX MICROSCOPIC
Bacteria, UA: NONE SEEN
Bilirubin Urine: NEGATIVE
Glucose, UA: NEGATIVE mg/dL
Ketones, ur: 80 mg/dL — AB
Leukocytes,Ua: NEGATIVE
Nitrite: NEGATIVE
Protein, ur: 30 mg/dL — AB
Specific Gravity, Urine: 1.045 — ABNORMAL HIGH (ref 1.005–1.030)
pH: 6 (ref 5.0–8.0)

## 2021-01-07 LAB — COMPREHENSIVE METABOLIC PANEL
ALT: 31 U/L (ref 0–44)
AST: 30 U/L (ref 15–41)
Albumin: 4 g/dL (ref 3.5–5.0)
Alkaline Phosphatase: 42 U/L (ref 38–126)
Anion gap: 13 (ref 5–15)
BUN: 6 mg/dL (ref 6–20)
CO2: 18 mmol/L — ABNORMAL LOW (ref 22–32)
Calcium: 8.8 mg/dL — ABNORMAL LOW (ref 8.9–10.3)
Chloride: 106 mmol/L (ref 98–111)
Creatinine, Ser: 0.56 mg/dL (ref 0.44–1.00)
GFR, Estimated: >60 mL/min (ref >60)
Glucose, Bld: 107 mg/dL — ABNORMAL HIGH (ref 70–99)
Potassium: 3.5 mmol/L (ref 3.5–5.1)
Sodium: 137 mmol/L (ref 135–145)
Total Bilirubin: 0.7 mg/dL (ref 0.3–1.2)
Total Protein: 7.4 g/dL (ref 6.5–8.1)

## 2021-01-07 LAB — I-STAT BETA HCG BLOOD, ED (MC, WL, AP ONLY): I-stat hCG, quantitative: <5 m[IU]/mL (ref <5)

## 2021-01-07 LAB — CBC
HCT: 38.2 % (ref 36.0–46.0)
Hemoglobin: 13.1 g/dL (ref 12.0–15.0)
MCH: 28.8 pg (ref 26.0–34.0)
MCHC: 34.3 g/dL (ref 30.0–36.0)
MCV: 84 fL (ref 80.0–100.0)
Platelets: 186 10*3/uL (ref 150–400)
RBC: 4.55 MIL/uL (ref 3.87–5.11)
RDW: 13 % (ref 11.5–15.5)
WBC: 7 10*3/uL (ref 4.0–10.5)
nRBC: 0 % (ref 0.0–0.2)

## 2021-01-07 LAB — LIPASE, BLOOD: Lipase: 30 U/L (ref 11–51)

## 2021-01-07 LAB — MONONUCLEOSIS SCREEN: Mono Screen: NEGATIVE

## 2021-01-07 LAB — CRYPTOCOCCAL ANTIGEN: Crypto Ag: NEGATIVE

## 2021-01-07 LAB — SEDIMENTATION RATE: Sed Rate: 30 mm/hr — ABNORMAL HIGH (ref 0–22)

## 2021-01-07 LAB — LACTATE DEHYDROGENASE: LDH: 247 U/L — ABNORMAL HIGH (ref 98–192)

## 2021-01-07 LAB — HIV ANTIBODY (ROUTINE TESTING W REFLEX): HIV Screen 4th Generation wRfx: NONREACTIVE

## 2021-01-07 MED ORDER — FLUTICASONE PROPIONATE 50 MCG/ACT NA SUSP: 2.0000 | Freq: Every day | NASAL | Status: DC | PRN

## 2021-01-07 MED ORDER — LOPERAMIDE HCL 2 MG PO CAPS
2.0000 mg | ORAL_CAPSULE | Freq: Every day | ORAL | Status: DC | PRN
  Administered 2021-01-13: 2 mg via ORAL
  Filled 2021-01-07: qty 1

## 2021-01-07 MED ORDER — LORATADINE 10 MG PO TABS
10.0000 mg | ORAL_TABLET | Freq: Every day | ORAL | Status: DC
  Administered 2021-01-08 – 2021-01-21 (×14): 10 mg via ORAL
  Filled 2021-01-07 (×14): qty 1

## 2021-01-07 MED ORDER — MORPHINE SULFATE (PF) 4 MG/ML IV SOLN
4.0000 mg | Freq: Once | INTRAVENOUS | Status: AC
  Administered 2021-01-07: 4 mg via INTRAVENOUS
  Filled 2021-01-07: qty 1

## 2021-01-07 MED ORDER — LAMOTRIGINE 25 MG PO TABS
50.0000 mg | ORAL_TABLET | Freq: Every day | ORAL | Status: DC
  Administered 2021-01-07 – 2021-01-11 (×5): 50 mg via ORAL
  Filled 2021-01-07 (×5): qty 2

## 2021-01-07 MED ORDER — MORPHINE SULFATE (PF) 4 MG/ML IV SOLN
4.0000 mg | Freq: Once | INTRAVENOUS | Status: AC
Start: 2021-01-07 — End: 2021-01-07
  Administered 2021-01-07: 4 mg via INTRAVENOUS
  Filled 2021-01-07: qty 1

## 2021-01-07 MED ORDER — ONDANSETRON HCL 4 MG/2ML IJ SOLN
4.0000 mg | Freq: Four times a day (QID) | INTRAMUSCULAR | Status: DC | PRN
  Administered 2021-01-07: 4 mg via INTRAVENOUS
  Filled 2021-01-07: qty 2

## 2021-01-07 MED ORDER — SODIUM CHLORIDE 0.9 % IV BOLUS
1000.0000 mL | Freq: Once | INTRAVENOUS | Status: AC
  Administered 2021-01-07: 1000 mL via INTRAVENOUS

## 2021-01-07 MED ORDER — OXYCODONE HCL 5 MG PO TABS
5.0000 mg | ORAL_TABLET | ORAL | Status: DC | PRN
  Administered 2021-01-07 – 2021-01-20 (×17): 5 mg via ORAL
  Filled 2021-01-07 (×18): qty 1

## 2021-01-07 MED ORDER — ACETAMINOPHEN 325 MG PO TABS
650.0000 mg | ORAL_TABLET | Freq: Four times a day (QID) | ORAL | Status: DC | PRN
  Administered 2021-01-07 – 2021-01-08 (×3): 650 mg via ORAL
  Filled 2021-01-07 (×3): qty 2

## 2021-01-07 MED ORDER — ACETAMINOPHEN 650 MG RE SUPP: 650.0000 mg | Freq: Four times a day (QID) | RECTAL | Status: DC | PRN

## 2021-01-07 MED ORDER — ALBUTEROL SULFATE HFA 108 (90 BASE) MCG/ACT IN AERS
2.0000 | INHALATION_SPRAY | Freq: Four times a day (QID) | RESPIRATORY_TRACT | Status: DC | PRN
  Filled 2021-01-07: qty 6.7

## 2021-01-07 MED ORDER — PROCHLORPERAZINE EDISYLATE 10 MG/2ML IJ SOLN
10.0000 mg | Freq: Once | INTRAMUSCULAR | Status: AC
  Administered 2021-01-07: 10 mg via INTRAVENOUS
  Filled 2021-01-07: qty 2

## 2021-01-07 MED ORDER — BUSPIRONE HCL 10 MG PO TABS
10.0000 mg | ORAL_TABLET | Freq: Two times a day (BID) | ORAL | Status: DC
  Administered 2021-01-07 – 2021-01-21 (×29): 10 mg via ORAL
  Filled 2021-01-07 (×10): qty 1
  Filled 2021-01-07: qty 2
  Filled 2021-01-07 (×6): qty 1
  Filled 2021-01-07: qty 2
  Filled 2021-01-07 (×6): qty 1
  Filled 2021-01-07: qty 2
  Filled 2021-01-07 (×4): qty 1

## 2021-01-07 MED ORDER — HYDROMORPHONE HCL 1 MG/ML IJ SOLN
0.5000 mg | INTRAMUSCULAR | Status: DC | PRN
Start: 2021-01-07 — End: 2021-01-10
  Administered 2021-01-07 – 2021-01-10 (×8): 0.5 mg via INTRAVENOUS
  Filled 2021-01-07 (×8): qty 0.5

## 2021-01-07 MED ORDER — ACETAMINOPHEN 500 MG PO TABS
1000.0000 mg | ORAL_TABLET | Freq: Once | ORAL | Status: AC
  Administered 2021-01-07: 1000 mg via ORAL
  Filled 2021-01-07: qty 2

## 2021-01-07 MED ORDER — SODIUM CHLORIDE 0.9 % IV SOLN
1.0000 g | Freq: Once | INTRAVENOUS | Status: AC
  Administered 2021-01-07: 1 g via INTRAVENOUS
  Filled 2021-01-07: qty 10

## 2021-01-07 MED ORDER — SODIUM CHLORIDE 0.9 % IV SOLN: INTRAVENOUS | Status: DC

## 2021-01-07 MED ORDER — TRAZODONE HCL 50 MG PO TABS
50.0000 mg | ORAL_TABLET | Freq: Every evening | ORAL | Status: DC | PRN
  Administered 2021-01-10 – 2021-01-20 (×8): 50 mg via ORAL
  Filled 2021-01-07 (×8): qty 1

## 2021-01-07 MED ORDER — ONDANSETRON HCL 4 MG PO TABS
4.0000 mg | ORAL_TABLET | Freq: Four times a day (QID) | ORAL | Status: DC | PRN
  Administered 2021-01-08: 4 mg via ORAL
  Filled 2021-01-07: qty 1

## 2021-01-07 MED ORDER — LINACLOTIDE 145 MCG PO CAPS
145.0000 ug | ORAL_CAPSULE | Freq: Every day | ORAL | Status: DC | PRN
  Filled 2021-01-07: qty 1

## 2021-01-07 MED ORDER — ONDANSETRON HCL 4 MG/2ML IJ SOLN
4.0000 mg | Freq: Once | INTRAMUSCULAR | Status: AC
  Administered 2021-01-07: 4 mg via INTRAVENOUS
  Filled 2021-01-07: qty 2

## 2021-01-07 MED ORDER — IOHEXOL 300 MG/ML  SOLN
100.0000 mL | Freq: Once | INTRAMUSCULAR | Status: AC | PRN
  Administered 2021-01-07: 100 mL via INTRAVENOUS

## 2021-01-07 MED ORDER — MONTELUKAST SODIUM 10 MG PO TABS
10.0000 mg | ORAL_TABLET | Freq: Every day | ORAL | Status: DC
  Administered 2021-01-07 – 2021-01-20 (×14): 10 mg via ORAL
  Filled 2021-01-07 (×13): qty 1

## 2021-01-07 MED ORDER — ENOXAPARIN SODIUM 40 MG/0.4ML IJ SOSY
40.0000 mg | PREFILLED_SYRINGE | INTRAMUSCULAR | Status: DC
  Administered 2021-01-07 – 2021-01-16 (×9): 40 mg via SUBCUTANEOUS
  Filled 2021-01-07 (×11): qty 0.4

## 2021-01-07 NOTE — H&P (Addendum)
History and Physical    Cambri Plourde FXT:[id number] DOB: 11-24-2000 DOA: 01/07/2021  PCP: Lynetta Mare, NP  Patient coming from: Home  Chief Complaint: left flank pain  HPI: Alexandra Young is a 20 y.o. female with medical history significant of IBS, anxiety. Presenting with left flank pain. She states there has been a constant ache in her left flank with intermittent sharpness for the last 5 days. She tried excedrin to help, but it didn't. On 4/27 she came to the ED with these symptoms. She had a full workup including pelvis exam. Left adnexal pain was noted. She had clue cell on wet prep. A CT scan and ultrasound showed a 2.3 cm left adnexal cyst. No torsion was noted. She denied being sexually active for the last 7 months. Chlamydia and gonorrhea were negative at the time. It was believed that her pain was caused by a ruptured cyst. She was sent home with ibuprofen and anti-emetics. She says the pain resumed the next day. Initially it was tolerable with the ibuprofen. However, the following day, she called her PCP for a stronger prescription. She was given hydrocodone. However, this did not help. Her N/V returned. She noted at fever at home. She decided to come back to the ED for help. She denies any other aggravating or alleviating factors.    ED Course: CT ab/pelvis was negative for acute abdominal/pelvic abnormality. It did not a nonspecific slightly prominence of retroperitoneal lymph nodes and spleen that could be related to a lymphoproliferative process or infectious etiology. Mononucleosis was negative. She had already been started on rocpehin for presumed pyelonephritis. Her UA came back negative. [facility] was called for admission.   Review of Systems:  Denies CP, dyspnea, palpitations, syncopal episodes, diarrhea, sick contacts. Review of systems is otherwise negative for all not mentioned in HPI.   PMHx Past Medical History:  Diagnosis Date  . Anxiety   . Asthma   . Depression   . IBS  (irritable bowel syndrome)     PSHx History reviewed. No pertinent surgical history.  SocHx  reports that she has never smoked. She has never used smokeless tobacco. She reports that she does not drink alcohol and does not use drugs.  No Known Allergies  FamHx Family History  Problem Relation Age of Onset  . Asthma Mother   . Thyroid disease Mother     Prior to Admission medications   Medication Sig Start Date End Date Taking? Authorizing Provider  albuterol (VENTOLIN HFA) 108 (90 Base) MCG/ACT inhaler Inhale 2 puffs into the lungs every 6 (six) hours as needed for wheezing or shortness of breath. 11/11/20   [provider]  Aspirin-Acetaminophen-Caffeine (EXCEDRIN PO) Take 1 tablet by mouth daily as needed (headache).    [provider]  butalbital-acetaminophen-caffeine (FIORICET) 50-325-40 MG tablet Take 1 tablet by mouth every 6 (six) hours as needed for migraine. 07/02/19   [provider]  cetirizine (ZYRTEC) 10 MG tablet Take 10 mg by mouth 2 (two) times daily.    [provider]  etonogestrel (NEXPLANON) 68 MG IMPL implant 68 mg by Subdermal route continuous.    [provider]  fluticasone (FLONASE) 50 MCG/ACT nasal spray Place 2 sprays into both nostrils daily.    [provider]  Galcanezumab-gnlm (EMGALITY) 120 MG/ML SOAJ Inject 120 mg into the skin every 30 (thirty) days. 12/30/20   [provider]  ibuprofen (ADVIL) 600 MG tablet Take 1 tablet (600 mg total) by mouth every 6 (six) hours as  needed. 01/04/21   Domenic Moras, PA-C  linaclotide [facility]) 145 MCG CAPS capsule Take 145 mcg by mouth daily as needed (constipation).    [provider]  loperamide (IMODIUM) 2 MG capsule Take 2 mg by mouth daily as needed for diarrhea or loose stools. 10/06/20   [provider]  montelukast (SINGULAIR) 10 MG tablet Take 10 mg by mouth at bedtime. 11/06/19   [provider]  omeprazole (PRILOSEC) 40 MG  capsule Take 40 mg by mouth daily.    [provider]  ondansetron (ZOFRAN) 4 MG tablet Take 1 tablet (4 mg total) by mouth every 8 (eight) hours as needed for nausea or vomiting. 01/04/21   Domenic Moras, PA-C  ondansetron (ZOFRAN-ODT) 4 MG disintegrating tablet Place 4 mg under the tongue every 8 (eight) hours as needed for nausea. 10/19/20   [provider]  polyethylene glycol (MIRALAX / GLYCOLAX) 17 g packet Take 17 g by mouth daily as needed.    [provider]  traZODone (DESYREL) 50 MG tablet Take 50 mg by mouth at bedtime as needed for sleep. 12/12/20 03/12/21  [provider]  Vitamin D, Ergocalciferol, (DRISDOL) 1.25 MG (50000 UNIT) CAPS capsule Take 50,000 Units by mouth every 7 (seven) days.    [provider]  medroxyPROGESTERone (DEPO-PROVERA) 150 MG/ML injection Inject 150 mg into the muscle every 3 (three) months.  05/20/20  [provider]    Physical Exam: Vitals:   01/07/21 0945 01/07/21 0948 01/07/21 1030 01/07/21 1100  BP: 108/69  106/63 98/70  Pulse: (!) 108  95 94  Resp: (!) 34  15 15  Temp:  99.3 F (37.4 C)    TempSrc:  Oral    SpO2: 99%  99% 97%  Weight:      Height:        General: 20 y.o. female resting in bed in NAD Eyes: PERRL, normal sclera ENMT: Nares patent w/o discharge, orophaynx clear, dentition normal, ears w/o discharge/lesions/ulcers Neck: Supple, trachea midline Cardiovascular: tachy +S1, S2, no m/g/r, equal pulses throughout Respiratory: CTABL, no w/r/r, normal WOB GI: BS+, ND, LUQ/left flank pain, no masses noted MSK: No e/c/c Skin: No rashes, bruises, ulcerations noted Neuro: A&O x 3, no focal deficits Psyc: Appropriate interaction but flat affect, calm/cooperative  Labs on Admission: I have personally reviewed following labs and imaging studies  CBC: Recent Labs  Lab 01/04/21 1535 01/07/21 0619  WBC 7.9 7.0  NEUTROABS 5.6  --   HGB 13.4 13.1  HCT 40.6 38.2  MCV 86.2 84.0  PLT 233  937   Basic Metabolic Panel: Recent Labs  Lab 01/04/21 1535 01/07/21 0619  NA 139 137  K 3.5 3.5  CL 107 106  CO2 22 18*  GLUCOSE 92 107*  BUN 9 6  CREATININE 0.69 0.56  CALCIUM 9.2 8.8*   GFR: Estimated Creatinine Clearance: 95.7 mL/min (by C-G formula based on SCr of 0.56 mg/dL). Liver Function Tests: Recent Labs  Lab 01/04/21 1535 01/07/21 0619  AST 23 30  ALT 27 31  ALKPHOS 45 42  BILITOT 0.8 0.7  PROT 8.1 7.4  ALBUMIN 4.7 4.0   Recent Labs  Lab 01/07/21 0619  LIPASE 30   No results for input(s): AMMONIA in the last 168 hours. Coagulation Profile: No results for input(s): INR, PROTIME in the last 168 hours. Cardiac Enzymes: No results for input(s): CKTOTAL, CKMB, CKMBINDEX, TROPONINI in the last 168 hours. BNP (last 3 results) No results for input(s): PROBNP in the  last 8760 hours. HbA1C: No results for input(s): HGBA1C in the last 72 hours. CBG: No results for input(s): GLUCAP in the last 168 hours. Lipid Profile: No results for input(s): CHOL, HDL, LDLCALC, TRIG, CHOLHDL, LDLDIRECT in the last 72 hours. Thyroid Function Tests: No results for input(s): TSH, T4TOTAL, FREET4, T3FREE, THYROIDAB in the last 72 hours. Anemia Panel: No results for input(s): VITAMINB12, FOLATE, FERRITIN, TIBC, IRON, RETICCTPCT in the last 72 hours. Urine analysis:    Component Value Date/Time   COLORURINE YELLOW 01/07/2021 0932   APPEARANCEUR CLEAR 01/07/2021 0932   LABSPEC 1.045 (H) 01/07/2021 0932   PHURINE 6.0 01/07/2021 0932   GLUCOSEU NEGATIVE 01/07/2021 0932   HGBUR LARGE (A) 01/07/2021 0932   BILIRUBINUR NEGATIVE 01/07/2021 0932   KETONESUR 80 (A) 01/07/2021 0932   PROTEINUR 30 (A) 01/07/2021 0932   UROBILINOGEN 0.2 11/22/2020 0848   NITRITE NEGATIVE 01/07/2021 0932   LEUKOCYTESUR NEGATIVE 01/07/2021 0932    Radiological Exams on Admission: CT ABDOMEN PELVIS W CONTRAST  Result Date: 01/07/2021 CLINICAL DATA:  20 year old with abdominal pain. Suspect  pyelonephritis. EXAM: CT ABDOMEN AND PELVIS WITH CONTRAST TECHNIQUE: Multidetector CT imaging of the abdomen and pelvis was performed using the standard protocol following bolus administration of intravenous contrast. CONTRAST:  152mL OMNIPAQUE IOHEXOL 300 MG/ML  SOLN COMPARISON:  CT renal stone protocol 01/04/2021 and pelvic ultrasound 01/04/2021 FINDINGS: Lower chest: Lung bases are clear.  No pleural effusions. Hepatobiliary: Normal appearance of the liver, gallbladder and portal venous system. No biliary dilatation Pancreas: Unremarkable. No pancreatic ductal dilatation or surrounding inflammatory changes. Spleen: Spleen is prominent for size measuring 12.7 cm in the AP dimension. Adrenals/Urinary Tract: Normal adrenal glands. Normal appearance of both kidneys without hydronephrosis or perinephric edema. Normal appearance of the urinary bladder. Stomach/Bowel: Stomach is within normal limits. Appendix appears normal. No evidence of bowel wall thickening, distention, or inflammatory changes. Vascular/Lymphatic: Normal appearance of the abdominal aorta. Normal appearance of the venous structures. There is prominent periaortic soft tissue particularly along the left side of the aorta. Soft tissue measures up to 1.8 cm on sequence 2 image 35. There was a similar finding on the recent comparison CT. Findings are compatible with enlarged lymph nodes in this area. Small lymph nodes along the anterior aspect of the distal IVC. No significant pelvic lymph node enlargement. Symmetric lymph nodes along the pelvic sidewall measure 0.7 cm on sequence 2, image 68. Reproductive: 2.0 cm low-density structure in the left adnexa could represent a prominent follicle and compatible with the recent ultrasound findings. Normal appearance of the uterus. Other: Small amount of free fluid in the pelvis. Musculoskeletal: No acute bone abnormality. IMPRESSION: 1. No acute abnormality in the abdomen or pelvis. Specifically, no evidence for  hydronephrosis or pyelonephritis. 2. Slightly prominent retroperitoneal lymph nodes particularly along the left side of the aorta. Spleen is also prominent for size. Findings are nonspecific but could be related to a lymphoproliferative process or infectious etiology such as mononucleosis. 3. Small amount of free fluid in the pelvis is likely physiologic. Electronically Signed   By: Markus Daft M.D.   On: 01/07/2021 09:56    EKG: Independently reviewed. Sinus tach, no st elevations  Assessment/Plan Intractable N/V Intractable pain/Left flank pain     - place in obs, med-surg     - anti-emetics, pain meds, fluids     - Spoke with Gyn about her case; w/u is pretty thorough, nothing to add from their stand point  Fever of unknown origin Splenomegaly     -  white count it normal; CT w/o source of infection, monoscreen is negative     - drug fever?     - check ESR, LDH, HIV, CMV, bld cx, Ucx     - spoke with ID: check full EBV panel, histo, blasto, and crypto, syphilis; follow up in ID clinic     - consider onco input  IBS     - resume home meds  Anxiety     - resume home meds  DVT prophylaxis: lovenox  Code Status: FULL  Family Communication: None at bedside  Consults called: None   Status is: Observation  The patient remains OBS appropriate and will d/c before 2 midnights.  Dispo: The patient is from: Home              Anticipated d/c is to: Home              Patient currently is not medically stable to d/c.   Difficult to place patient No  Time spent coordinating admission: 75 minutes  [facility]  If 7PM-7AM, please contact night-coverage [email or web address]  01/07/2021, 11:10 AM

## 2021-01-07 NOTE — ED Provider Notes (Signed)
[facility]-EMERGENCY DEPT Provider Note   CSN: [id number] Arrival date & time: 01/07/21  2122     History Chief Complaint  Patient presents with  . Pelvic Pain  . Fever    Alexandra Young is a 20 y.o. female.  20 year old female with prior medical history as detailed below presents for evaluation.  Patient complains of persistent left-sided flank discomfort and pain.  This is associate with nausea and vomiting.  She is also reporting a fever.  Maximum temperature reported at home was around 102.  Patient was seen for similar complaint 3 days prior.  Work-up was extensive and included abdominal CT and pelvic ultrasound.  Pelvic exam was performed.  Work-up revealed left ovarian cyst without other significant findings.  GC chlamydia cultures were negative.  Patient does report slightly increased urination over the last 2 days.  She reports some mild dysuria as well.  The history is provided by the patient and medical records.  Flank Pain This is a new problem. The current episode started more than 2 days ago. The problem occurs constantly. The problem has not changed since onset.Pertinent negatives include no chest pain and no abdominal pain. Nothing aggravates the symptoms. Nothing relieves the symptoms.       Past Medical History:  Diagnosis Date  . Anxiety   . Asthma   . Depression   . IBS (irritable bowel syndrome)     There are no problems to display for this patient.   History reviewed. No pertinent surgical history.   OB History   No obstetric history on file.     Family History  Problem Relation Age of Onset  . Asthma Mother   . Thyroid disease Mother     Social History   Tobacco Use  . Smoking status: Never Smoker  . Smokeless tobacco: Never Used  Vaping Use  . Vaping Use: Never used  Substance Use Topics  . Alcohol use: Never  . Drug use: Never    Home Medications Prior to Admission medications   Medication Sig Start Date  End Date Taking? Authorizing Provider  albuterol (VENTOLIN HFA) 108 (90 Base) MCG/ACT inhaler Inhale 2 puffs into the lungs every 6 (six) hours as needed for wheezing or shortness of breath. 11/11/20   [provider]  Aspirin-Acetaminophen-Caffeine (EXCEDRIN PO) Take 1 tablet by mouth daily as needed (headache).    [provider]  butalbital-acetaminophen-caffeine (FIORICET) 50-325-40 MG tablet Take 1 tablet by mouth every 6 (six) hours as needed for migraine. 07/02/19   [provider]  cetirizine (ZYRTEC) 10 MG tablet Take 10 mg by mouth 2 (two) times daily.    [provider]  etonogestrel (NEXPLANON) 68 MG IMPL implant 68 mg by Subdermal route continuous.    [provider]  fluticasone (FLONASE) 50 MCG/ACT nasal spray Place 2 sprays into both nostrils daily.    [provider]  Galcanezumab-gnlm (EMGALITY) 120 MG/ML SOAJ Inject 120 mg into the skin every 30 (thirty) days. 12/30/20   [provider]  ibuprofen (ADVIL) 600 MG tablet Take 1 tablet (600 mg total) by mouth every 6 (six) hours as needed. 01/04/21   Fayrene Helper, PA-C  linaclotide [facility]) 145 MCG CAPS capsule Take 145 mcg by mouth daily as needed (constipation).    [provider]  loperamide (IMODIUM) 2 MG capsule Take 2 mg by mouth daily as needed for diarrhea or loose stools. 10/06/20   [provider]  montelukast (SINGULAIR) 10 MG tablet Take  10 mg by mouth at bedtime. 11/06/19   [provider]  omeprazole (PRILOSEC) 40 MG capsule Take 40 mg by mouth daily.    [provider]  ondansetron (ZOFRAN) 4 MG tablet Take 1 tablet (4 mg total) by mouth every 8 (eight) hours as needed for nausea or vomiting. 01/04/21   Fayrene Helperran, [place], PA-C  ondansetron (ZOFRAN-ODT) 4 MG disintegrating tablet Place 4 mg under the tongue every 8 (eight) hours as needed for nausea. 10/19/20   [provider]  polyethylene glycol (MIRALAX / GLYCOLAX) 17 g packet  Take 17 g by mouth daily as needed.    [provider]  traZODone (DESYREL) 50 MG tablet Take 50 mg by mouth at bedtime as needed for sleep. 12/12/20 03/12/21  [provider]  Vitamin D, Ergocalciferol, (DRISDOL) 1.25 MG (50000 UNIT) CAPS capsule Take 50,000 Units by mouth every 7 (seven) days.    [provider]  medroxyPROGESTERone (DEPO-PROVERA) 150 MG/ML injection Inject 150 mg into the muscle every 3 (three) months.  05/20/20  [provider]    Allergies    Patient has no known allergies.  Review of Systems   Review of Systems  Cardiovascular: Negative for chest pain.  Gastrointestinal: Negative for abdominal pain.  Genitourinary: Positive for flank pain.  All other systems reviewed and are negative.   Physical Exam Updated Vital Signs BP 108/69   Pulse (!) 108   Temp 99.3 F (37.4 C) (Oral)   Resp (!) 34   Ht 5\' 2"  (1.575 m)   Wt 61.2 kg   SpO2 99%   BMI 24.69 kg/m   Physical Exam Vitals and nursing note reviewed.  Constitutional:      General: She is not in acute distress.    Appearance: She is well-developed.  HENT:     Head: Normocephalic and atraumatic.  Eyes:     Conjunctiva/sclera: Conjunctivae normal.     Pupils: Pupils are equal, round, and reactive to light.  Cardiovascular:     Rate and Rhythm: Normal rate and regular rhythm.     Heart sounds: Normal heart sounds.  Pulmonary:     Effort: Pulmonary effort is normal. No respiratory distress.     Breath sounds: Normal breath sounds.  Abdominal:     General: There is no distension.     Palpations: Abdomen is soft.     Tenderness: There is abdominal tenderness.     Comments: Mild LUQ tenderness radiating to left flank  Genitourinary:    Comments: Left CVAT Musculoskeletal:        General: No deformity. Normal range of motion.     Cervical back: Normal range of motion and neck supple.  Skin:    General: Skin is warm and dry.  Neurological:     General: No focal  deficit present.     Mental Status: She is alert and oriented to person, place, and time.     ED Results / Procedures / Treatments   Labs (all labs ordered are listed, but only abnormal results are displayed) Labs Reviewed  COMPREHENSIVE METABOLIC PANEL - Abnormal; Notable for the following components:      Result Value   CO2 18 (*)    Glucose, Bld 107 (*)    Calcium 8.8 (*)    All other components within normal limits  URINALYSIS, ROUTINE W REFLEX MICROSCOPIC - Abnormal; Notable for the following components:   Specific Gravity, Urine 1.045 (*)    Hgb urine dipstick LARGE (*)  Ketones, ur 80 (*)    Protein, ur 30 (*)    All other components within normal limits  URINE CULTURE  RESP PANEL BY RT-PCR (FLU A&B, COVID) ARPGX2  LIPASE, BLOOD  CBC  MONONUCLEOSIS SCREEN  I-STAT BETA HCG BLOOD, ED ([facility], [facility], [facility] ONLY)    EKG None  Radiology CT ABDOMEN PELVIS W CONTRAST  Result Date: 01/07/2021 CLINICAL DATA:  20 year old with abdominal pain. Suspect pyelonephritis. EXAM: CT ABDOMEN AND PELVIS WITH CONTRAST TECHNIQUE: Multidetector CT imaging of the abdomen and pelvis was performed using the standard protocol following bolus administration of intravenous contrast. CONTRAST:  OMNIPAQUE IOHEXOL 300 MG/ML  SOLN COMPARISON:  CT renal stone protocol 01/04/2021 and pelvic ultrasound 01/04/2021 FINDINGS: Lower chest: Lung bases are clear.  No pleural effusions. Hepatobiliary: Normal appearance of the liver, gallbladder and portal venous system. No biliary dilatation Pancreas: Unremarkable. No pancreatic ductal dilatation or surrounding inflammatory changes. Spleen: Spleen is prominent for size measuring 12.7 cm in the AP dimension. Adrenals/Urinary Tract: Normal adrenal glands. Normal appearance of both kidneys without hydronephrosis or perinephric edema. Normal appearance of the urinary bladder. Stomach/Bowel: Stomach is within normal limits. Appendix appears normal. No evidence of bowel wall  thickening, distention, or inflammatory changes. Vascular/Lymphatic: Normal appearance of the abdominal aorta. Normal appearance of the venous structures. There is prominent periaortic soft tissue particularly along the left side of the aorta. Soft tissue measures up to 1.8 cm on sequence 2 image 35. There was a similar finding on the recent comparison CT. Findings are compatible with enlarged lymph nodes in this area. Small lymph nodes along the anterior aspect of the distal IVC. No significant pelvic lymph node enlargement. Symmetric lymph nodes along the pelvic sidewall measure 0.7 cm on sequence 2, image 68. Reproductive: 2.0 cm low-density structure in the left adnexa could represent a prominent follicle and compatible with the recent ultrasound findings. Normal appearance of the uterus. Other: Small amount of free fluid in the pelvis. Musculoskeletal: No acute bone abnormality. IMPRESSION: 1. No acute abnormality in the abdomen or pelvis. Specifically, no evidence for hydronephrosis or pyelonephritis. 2. Slightly prominent retroperitoneal lymph nodes particularly along the left side of the aorta. Spleen is also prominent for size. Findings are nonspecific but could be related to a lymphoproliferative process or infectious etiology such as mononucleosis. 3. Small amount of free fluid in the pelvis is likely physiologic. Electronically Signed   By: Richarda Overlie M.D.   On: 01/07/2021 09:56    Procedures Procedures   Medications Ordered in ED Medications  sodium chloride 0.9 % bolus 1,000 mL (1,000 mLs Intravenous New Bag/Given 01/07/21 0718)  ondansetron (ZOFRAN) injection 4 mg (4 mg Intravenous Given 01/07/21 0716)  morphine 4 MG/ML injection 4 mg (4 mg Intravenous Given 01/07/21 0718)  acetaminophen (TYLENOL) tablet 1,000 mg (1,000 mg Oral Given 01/07/21 0723)  cefTRIAXone (ROCEPHIN) 1 g in sodium chloride 0.9 % 100 mL IVPB (0 g Intravenous Stopped 01/07/21 0751)  iohexol (OMNIPAQUE) 300 MG/ML solution  100 mL (100 mLs Intravenous Contrast Given 01/07/21 0902)  morphine 4 MG/ML injection 4 mg (4 mg Intravenous Given 01/07/21 9211)    ED Course  I have reviewed the triage vital signs and the nursing notes.  Pertinent labs & imaging results that were available during my care of the patient were reviewed by me and considered in my medical decision making (see chart for details).    MDM Rules/Calculators/A&P  MDM  MSE complete  Khaya Theissen was evaluated in Emergency Department on 01/07/2021 for the symptoms described in the history of present illness. She was evaluated in the context of the global COVID-19 pandemic, which necessitated consideration that the patient might be at risk for infection with the SARS-CoV-2 virus that causes COVID-19. Institutional protocols and algorithms that pertain to the evaluation of patients at risk for COVID-19 are in a state of rapid change based on information released by regulatory bodies including the CDC and federal and state organizations. These policies and algorithms were followed during the patient's care in the ED.  Patient presented with complaint of left-sided flank pain with associated fever.  Patient with recent ED evaluation for similar complaint.  Work-up at that time was without evidence of significant acute pathology.  Pelvic exam from 3 days prior without significant abnormality.  GC chlamydia cultures obtained and are negative.  Wet prep from 3 days prior did demonstrate clue cells.  Patient now with a significant left upper quadrant and left flank discomfort.  Reported temperatures at home up to 102.  Patient's imaging studies from today prior included CT and ultrasound.  Patient was found to have left ovarian cyst.  Patient returns today with significant pain with associated nausea and vomiting.  Patient reports high temperatures at home.  Work-up is somewhat reassuring.  Patient's labs including urine are without  evidence of clear infection.  Patient was given 1 dose of Rocephin on initial evaluation given suspicion for possible pyelonephritis.  CT imaging from today demonstrates a slightly enlarged spleen with some left-sided retroperitoneal lymphadenopathy.  Presentation is strongly suspicious for possible early mononucleosis.  Monospot test today is negative.  Patient has been symptomatic for less than 1 week.  Patient would likely benefit from admission and observation for treatment of pain and nausea.  Hospitalist service is aware of case and will evaluate for admission.   Final Clinical Impression(s) / ED Diagnoses Final diagnoses:  Fever, unspecified fever cause  Left flank pain    Rx / DC Orders ED Discharge Orders    None       Wynetta Fines, MD 01/07/21 [phone]

## 2021-01-07 NOTE — Progress Notes (Signed)
Patient temperature 103 and pulse 118. Patient turned to yellow MEWS. Given PRN tylenol. MD notified. RED MEWS guidelines implemented.

## 2021-01-07 NOTE — ED Triage Notes (Addendum)
Pt reports left sided pelvic pain that has intensified since being seen on 4/27 for same. Pt says fever developed as high as 103.9. Vaginal bleeding that began yesterday.

## 2021-01-07 NOTE — Progress Notes (Signed)
curbsided for fever and intraabd lymphadenopathy and splenomegaly Clinically stable No pyelo/uti  -suggested initial id w/u -consider discussing case with oncology to w/u lymphoproliferative disease -I will f/u id serology in id clinic in 2 weeks    Clinic Follow Up Appt: 5/11 @ 4pm with dr Siddalee Vanderheiden  @  [facility] [address], [place], [place] [address] Phone: [phone]

## 2021-01-07 NOTE — ED Notes (Signed)
Encouraged to urinate.

## 2021-01-07 NOTE — Progress Notes (Signed)
Lab notified RN that patient is a difficult stick and were unable to get enough blood for all the labs ordered. MD notified and said to redraw tonight.

## 2021-01-08 DIAGNOSIS — R112 Nausea with vomiting, unspecified: Secondary | ICD-10-CM | POA: Diagnosis not present

## 2021-01-08 DIAGNOSIS — R52 Pain, unspecified: Secondary | ICD-10-CM

## 2021-01-08 DIAGNOSIS — R161 Splenomegaly, not elsewhere classified: Secondary | ICD-10-CM

## 2021-01-08 DIAGNOSIS — R509 Fever, unspecified: Secondary | ICD-10-CM

## 2021-01-08 DIAGNOSIS — R102 Pelvic and perineal pain: Secondary | ICD-10-CM

## 2021-01-08 LAB — COMPREHENSIVE METABOLIC PANEL
ALT: 38 U/L (ref 0–44)
AST: 39 U/L (ref 15–41)
Albumin: 3.3 g/dL — ABNORMAL LOW (ref 3.5–5.0)
Alkaline Phosphatase: 50 U/L (ref 38–126)
Anion gap: 10 (ref 5–15)
BUN: <5 mg/dL — ABNORMAL LOW (ref 6–20)
CO2: 20 mmol/L — ABNORMAL LOW (ref 22–32)
Calcium: 8.3 mg/dL — ABNORMAL LOW (ref 8.9–10.3)
Chloride: 108 mmol/L (ref 98–111)
Creatinine, Ser: 0.62 mg/dL (ref 0.44–1.00)
GFR, Estimated: >60 mL/min (ref >60)
Glucose, Bld: 83 mg/dL (ref 70–99)
Potassium: 3.3 mmol/L — ABNORMAL LOW (ref 3.5–5.1)
Sodium: 138 mmol/L (ref 135–145)
Total Bilirubin: 0.5 mg/dL (ref 0.3–1.2)
Total Protein: 6.7 g/dL (ref 6.5–8.1)

## 2021-01-08 LAB — URINE CULTURE: Culture: <10000 — AB

## 2021-01-08 LAB — CBC
HCT: 38.3 % (ref 36.0–46.0)
Hemoglobin: 12.5 g/dL (ref 12.0–15.0)
MCH: 28.6 pg (ref 26.0–34.0)
MCHC: 32.6 g/dL (ref 30.0–36.0)
MCV: 87.6 fL (ref 80.0–100.0)
Platelets: 156 10*3/uL (ref 150–400)
RBC: 4.37 MIL/uL (ref 3.87–5.11)
RDW: 13.2 % (ref 11.5–15.5)
WBC: 6.3 10*3/uL (ref 4.0–10.5)
nRBC: 0 % (ref 0.0–0.2)

## 2021-01-08 LAB — LACTIC ACID, PLASMA
Lactic Acid, Venous: 0.7 mmol/L (ref 0.5–1.9)
Lactic Acid, Venous: 0.9 mmol/L (ref 0.5–1.9)

## 2021-01-08 LAB — RPR: RPR Ser Ql: NONREACTIVE

## 2021-01-08 LAB — PROCALCITONIN: Procalcitonin: 0.11 ng/mL

## 2021-01-08 MED ORDER — ONDANSETRON HCL 4 MG/2ML IJ SOLN
4.0000 mg | Freq: Three times a day (TID) | INTRAMUSCULAR | Status: DC
Start: 2021-01-08 — End: 2021-01-14
  Administered 2021-01-09 – 2021-01-14 (×5): 4 mg via INTRAVENOUS
  Filled 2021-01-08 (×7): qty 2

## 2021-01-08 MED ORDER — ACETAMINOPHEN 325 MG PO TABS
650.0000 mg | ORAL_TABLET | Freq: Four times a day (QID) | ORAL | Status: DC | PRN
  Administered 2021-01-09 – 2021-01-10 (×3): 650 mg via ORAL
  Filled 2021-01-08 (×4): qty 2

## 2021-01-08 MED ORDER — ACETAMINOPHEN 325 MG PO TABS
650.0000 mg | ORAL_TABLET | Freq: Four times a day (QID) | ORAL | Status: AC
  Administered 2021-01-08 – 2021-01-09 (×3): 650 mg via ORAL
  Filled 2021-01-08 (×2): qty 2

## 2021-01-08 MED ORDER — DIPHENHYDRAMINE HCL 50 MG/ML IJ SOLN
50.0000 mg | Freq: Once | INTRAMUSCULAR | Status: AC
  Administered 2021-01-08: 50 mg via INTRAVENOUS

## 2021-01-08 MED ORDER — ONDANSETRON HCL 4 MG PO TABS
4.0000 mg | ORAL_TABLET | Freq: Three times a day (TID) | ORAL | Status: DC
  Administered 2021-01-08 – 2021-01-13 (×12): 4 mg via ORAL
  Filled 2021-01-08 (×13): qty 1

## 2021-01-08 MED ORDER — ACETAMINOPHEN 650 MG RE SUPP: 650.0000 mg | Freq: Four times a day (QID) | RECTAL | Status: DC | PRN

## 2021-01-08 MED ORDER — DIPHENHYDRAMINE HCL 50 MG/ML IJ SOLN
INTRAMUSCULAR | Status: AC
  Filled 2021-01-08: qty 1

## 2021-01-08 MED ORDER — IBUPROFEN 400 MG PO TABS
400.0000 mg | ORAL_TABLET | Freq: Once | ORAL | Status: AC
  Administered 2021-01-08: 400 mg via ORAL
  Filled 2021-01-08: qty 1

## 2021-01-08 MED ORDER — PROCHLORPERAZINE EDISYLATE 10 MG/2ML IJ SOLN
10.0000 mg | Freq: Four times a day (QID) | INTRAMUSCULAR | Status: DC | PRN
  Administered 2021-01-08 – 2021-01-16 (×10): 10 mg via INTRAVENOUS
  Filled 2021-01-08 (×11): qty 2

## 2021-01-08 NOTE — Progress Notes (Signed)
Received new order from DR Toniann Fail to give Advil 400mg  x1 dose. Administered Advil per order. Will continue to monitor patient.

## 2021-01-08 NOTE — Progress Notes (Signed)
Pt had fever 102.9*F at 0329. Administered Tab Tylenol at 0332, rechecked temp.after hour, temp went up 103*F. Notified to MD Toniann Fail. Will continue to monitor.

## 2021-01-08 NOTE — Progress Notes (Signed)
   01/07/21 2330  Assess: MEWS Score  Temp (!) 100.5 F (38.1 C)  BP 109/68  Pulse Rate (!) 108  Resp 18  SpO2 99 %  O2 Device Room Air  Assess: MEWS Score  MEWS Temp 1  MEWS Systolic 0  MEWS Pulse 1  MEWS RR 0  MEWS LOC 0  MEWS Score 2  MEWS Score Color Yellow  Assess: if the MEWS score is Yellow or Red  Were vital signs taken at a resting state? Yes  Focused Assessment No change from prior assessment  Early Detection of Sepsis Score *See Row Information* Low  MEWS guidelines implemented *See Row Information* Yes  Treat  MEWS Interventions Administered scheduled meds/treatments  Take Vital Signs  Increase Vital Sign Frequency  Red: Q 1hr X 4 then Q 4hr X 4, if remains red, continue Q 4hrs  Escalate  MEWS: Escalate Red: discuss with charge nurse/RN and provider, consider discussing with RRT  Notify: Charge Nurse/RN  Name of Charge Nurse/RN Notified Lendell Gallick RN  Date Charge Nurse/RN Notified 01/07/21  Time Charge Nurse/RN Notified 2330  Notify: Provider  Provider Name/Title Margie Ege  Date Provider Notified 01/07/21  Time Provider Notified 1530 (provider notified by previous shift RN)  Notification Type Page  Notification Reason Change in status  Provider response Evaluate remotely  Date of Provider Response 01/07/21  Time of Provider Response 1530  Document  Patient Outcome  (continue to monitor)

## 2021-01-08 NOTE — Progress Notes (Signed)
PROGRESS NOTE    Alexandra Young  QQV:[id number] DOB: 2001/02/08 DOA: 01/07/2021 PCP: Aretha Parrot, NP   Brief Narrative: Alexandra Young is a 20 y.o. female with a history of IBS and anxiety. She presented secondary to left flank pain. She was found to have significant fevers and non-specific findings on CT scan including a prominent spleen and retroperitoneal lymph nodes.   Assessment & Plan:   Principal Problem:   Intractable nausea and vomiting Active Problems:   Intractable pain   Pelvic pain   Fever of unknown origin   Splenomegaly   Intractable nausea/vomiting Unknown etiology. No emesis this morning -Continue Zofran prn  Intractable pain Left pelvic pain Unsure of etiology. Imaging significant for left ovarian cyst. Presentation discussed with Gynecology on admission with no recommendation for continued Gynecology follow-up.  -Continue oxycodone/dilaudid prn  Fever of unknown origin Patient with continued fevers. Cryptococcal antigen, HIV, RPR negative. Procalcitonin low. No associated leukocytosis. -Scheduled Tylenol x24 hours -Follow-up blood cultures, EBV ab  Splenomegaly Prominent per radiologist read. 12.7 cm in AP view. ' IBS Stable  Anxiety Stable -Continue Buspar and Lamictal  Allergic rhinitis -Continue Singulair and Claritin   DVT prophylaxis: Lovenox Code Status:   Code Status: Full Code Family Communication: None at bedside Disposition Plan: Discharge likely 2+ days pending continued fever workup   Consultants:   Hematology/oncology  Procedures:   None  Antimicrobials:  None    Subjective: Fevers/sweats/chills last night. LLQ pain is still present. No emesis this morning.  Objective: Vitals:   01/08/21 0329 01/08/21 0426 01/08/21 0628 01/08/21 1033  BP: 131/78  109/66 110/72  Pulse: (!) 112  88 82  Resp: 18  18 18   Temp: (!) 102.9 F (39.4 C) (!) 103 F (39.4 C) 99 F (37.2 C) 99 F (37.2 C)  TempSrc: Oral Oral  Oral Oral  SpO2: 100%  97% 98%  Weight:      Height:        Intake/Output Summary (Last 24 hours) at 01/08/2021 1214 Last data filed at 01/08/2021 1000 Gross per 24 hour  Intake 3418.24 ml  Output 2050 ml  Net 1368.24 ml   Filed Weights   01/07/21 0600  Weight: 61.2 kg    Examination:  General exam: Appears calm and comfortable. Slightly ill appearing Respiratory system: Clear to auscultation. Respiratory effort normal. Cardiovascular system: S1 & S2 heard, RRR. No murmurs, rubs, gallops or clicks. Gastrointestinal system: Abdomen is nondistended, soft and mildly tender in LLQ/pelvis. No organomegaly or masses felt. Normal bowel sounds heard. Central nervous system: Alert and oriented. No focal neurological deficits. Musculoskeletal: No edema. No calf tenderness Skin: No cyanosis. No rashes Psychiatry: Judgement and insight appear normal. Mood & affect appropriate.     Data Reviewed: I have personally reviewed following labs and imaging studies  CBC Lab Results  Component Value Date   WBC 6.3 01/08/2021   RBC 4.37 01/08/2021   HGB 12.5 01/08/2021   HCT 38.3 01/08/2021   MCV 87.6 01/08/2021   MCH 28.6 01/08/2021   PLT 156 01/08/2021   MCHC 32.6 01/08/2021   RDW 13.2 01/08/2021   LYMPHSABS 0.9 01/04/2021   MONOABS 0.8 01/04/2021   EOSABS 0.5 01/04/2021   BASOSABS 0.1 01/04/2021     Last metabolic panel Lab Results  Component Value Date   NA 138 01/08/2021   K 3.3 (L) 01/08/2021   CL 108 01/08/2021   CO2 20 (L) 01/08/2021   BUN <5 (L) 01/08/2021   CREATININE 0.62 01/08/2021  GLUCOSE 83 01/08/2021   GFRNONAA >60 01/08/2021   CALCIUM 8.3 (L) 01/08/2021   PROT 6.7 01/08/2021   ALBUMIN 3.3 (L) 01/08/2021   BILITOT 0.5 01/08/2021   ALKPHOS 50 01/08/2021   AST 39 01/08/2021   ALT 38 01/08/2021   ANIONGAP 10 01/08/2021    CBG (last 3)  No results for input(s): GLUCAP in the last 72 hours.   GFR: Estimated Creatinine Clearance: 95.7 mL/min (by C-G  formula based on SCr of 0.62 mg/dL).  Coagulation Profile: No results for input(s): INR, PROTIME in the last 168 hours.  Recent Results (from the past 240 hour(s))  Resp Panel by RT-PCR (Flu A&B, Covid) Nasopharyngeal Swab     Status: None   Collection Time: 01/04/21  4:24 PM   Specimen: Nasopharyngeal Swab; Nasopharyngeal(NP) swabs in vial transport medium  Result Value Ref Range Status   SARS Coronavirus 2 by RT PCR NEGATIVE NEGATIVE Final    Comment: (NOTE) SARS-CoV-2 target nucleic acids are NOT DETECTED.  The SARS-CoV-2 RNA is generally detectable in upper respiratory specimens during the acute phase of infection. The lowest concentration of SARS-CoV-2 viral copies this assay can detect is 138 copies/mL. A negative result does not preclude SARS-Cov-2 infection and should not be used as the sole basis for treatment or other patient management decisions. A negative result may occur with  improper specimen collection/handling, submission of specimen other than nasopharyngeal swab, presence of viral mutation(s) within the areas targeted by this assay, and inadequate number of viral copies(<138 copies/mL). A negative result must be combined with clinical observations, patient history, and epidemiological information. The expected result is Negative.  Fact Sheet for Patients:  BloggerCourse.com  Fact Sheet for Healthcare Providers:  SeriousBroker.it  This test is no t yet approved or cleared by the [place] FDA and  has been authorized for detection and/or diagnosis of SARS-CoV-2 by FDA under an Emergency Use Authorization (EUA). This EUA will remain  in effect (meaning this test can be used) for the duration of the COVID-19 declaration under Section 564(b)(1) of the Act, 21 U.S.C.section 360bbb-3(b)(1), unless the authorization is terminated  or revoked sooner.       Influenza A by PCR NEGATIVE NEGATIVE Final    Influenza B by PCR NEGATIVE NEGATIVE Final    Comment: (NOTE) The Xpert Xpress SARS-CoV-2/FLU/RSV plus assay is intended as an aid in the diagnosis of influenza from Nasopharyngeal swab specimens and should not be used as a sole basis for treatment. Nasal washings and aspirates are unacceptable for Xpert Xpress SARS-CoV-2/FLU/RSV testing.  Fact Sheet for Patients: BloggerCourse.com  Fact Sheet for Healthcare Providers: SeriousBroker.it  This test is not yet approved or cleared by the [place] FDA and has been authorized for detection and/or diagnosis of SARS-CoV-2 by FDA under an Emergency Use Authorization (EUA). This EUA will remain in effect (meaning this test can be used) for the duration of the COVID-19 declaration under Section 564(b)(1) of the Act, 21 U.S.C. section 360bbb-3(b)(1), unless the authorization is terminated or revoked.  Performed at [facility], [address] [place], [place] [address]   Wet prep, genital     Status: Abnormal   Collection Time: 01/04/21  7:09 PM   Specimen: PATH Cytology Cervicovaginal Ancillary Only  Result Value Ref Range Status   Yeast Wet Prep HPF POC NONE SEEN NONE SEEN Final   Trich, Wet Prep NONE SEEN NONE SEEN Final   Clue Cells Wet Prep HPF POC PRESENT (A) NONE SEEN Final  WBC, Wet Prep HPF POC MANY (A) NONE SEEN Final   Sperm NONE SEEN  Final    Comment: Performed at [facility], [address] [place], [place] [address]  Urine culture     Status: Abnormal   Collection Time: 01/07/21  9:32 AM   Specimen: Urine, Catheterized  Result Value Ref Range Status   Specimen Description   Final    URINE, CATHETERIZED Performed at [facility], [address], [place], [place] [address]    Special Requests   Final    NONE Performed at [facility], [address], [place], [place] [address]    Culture  (A)  Final    <10,000 COLONIES/mL INSIGNIFICANT GROWTH Performed at [facility], [address] [place], [place] [address]    Report Status 01/08/2021 FINAL  Final        Radiology Studies: CT ABDOMEN PELVIS W CONTRAST  Result Date: 01/07/2021 CLINICAL DATA:  20 year old with abdominal pain. Suspect pyelonephritis. EXAM: CT ABDOMEN AND PELVIS WITH CONTRAST TECHNIQUE: Multidetector CT imaging of the abdomen and pelvis was performed using the standard protocol following bolus administration of intravenous contrast. CONTRAST:  OMNIPAQUE IOHEXOL 300 MG/ML  SOLN COMPARISON:  CT renal stone protocol 01/04/2021 and pelvic ultrasound 01/04/2021 FINDINGS: Lower chest: Lung bases are clear.  No pleural effusions. Hepatobiliary: Normal appearance of the liver, gallbladder and portal venous system. No biliary dilatation Pancreas: Unremarkable. No pancreatic ductal dilatation or surrounding inflammatory changes. Spleen: Spleen is prominent for size measuring 12.7 cm in the AP dimension. Adrenals/Urinary Tract: Normal adrenal glands. Normal appearance of both kidneys without hydronephrosis or perinephric edema. Normal appearance of the urinary bladder. Stomach/Bowel: Stomach is within normal limits. Appendix appears normal. No evidence of bowel wall thickening, distention, or inflammatory changes. Vascular/Lymphatic: Normal appearance of the abdominal aorta. Normal appearance of the venous structures. There is prominent periaortic soft tissue particularly along the left side of the aorta. Soft tissue measures up to 1.8 cm on sequence 2 image 35. There was a similar finding on the recent comparison CT. Findings are compatible with enlarged lymph nodes in this area. Small lymph nodes along the anterior aspect of the distal IVC. No significant pelvic lymph node enlargement. Symmetric lymph nodes along the pelvic sidewall measure 0.7 cm on sequence 2, image 68. Reproductive: 2.0 cm low-density structure in  the left adnexa could represent a prominent follicle and compatible with the recent ultrasound findings. Normal appearance of the uterus. Other: Small amount of free fluid in the pelvis. Musculoskeletal: No acute bone abnormality. IMPRESSION: 1. No acute abnormality in the abdomen or pelvis. Specifically, no evidence for hydronephrosis or pyelonephritis. 2. Slightly prominent retroperitoneal lymph nodes particularly along the left side of the aorta. Spleen is also prominent for size. Findings are nonspecific but could be related to a lymphoproliferative process or infectious etiology such as mononucleosis. 3. Small amount of free fluid in the pelvis is likely physiologic. Electronically Signed   By: Richarda Overlie M.D.   On: 01/07/2021 09:56        Scheduled Meds: . acetaminophen  650 mg Oral Q6H  . busPIRone  10 mg Oral BID  . enoxaparin (LOVENOX) injection  40 mg Subcutaneous Q24H  . lamoTRIgine  50 mg Oral Daily  . loratadine  10 mg Oral Daily  . montelukast  10 mg Oral QHS   Continuous Infusions: . sodium chloride 75 mL/hr at 01/08/21 0800     LOS: 0  days     Jacquelin Hawkingalph Tennille Montelongo, MD [facility] 01/08/2021, 12:14 PM  If 7PM-7AM, please contact night-coverage [email or web address]

## 2021-01-08 NOTE — Progress Notes (Signed)
   01/08/21 0329  Assess: MEWS Score  Temp (!) 102.9 F (39.4 C)  BP 131/78  Pulse Rate (!) 112  Resp 18  SpO2 100 %  O2 Device Room Air  Assess: MEWS Score  MEWS Temp 2  MEWS Systolic 0  MEWS Pulse 2  MEWS RR 0  MEWS LOC 0  MEWS Score 4  MEWS Score Color Red  Assess: if the MEWS score is Yellow or Red  Were vital signs taken at a resting state? Yes  Focused Assessment No change from prior assessment  Early Detection of Sepsis Score *See Row Information* Low  MEWS guidelines implemented *See Row Information* Yes  Treat  MEWS Interventions Administered prn meds/treatments;Administered scheduled meds/treatments  Take Vital Signs  Increase Vital Sign Frequency  Red: Q 1hr X 4 then Q 4hr X 4, if remains red, continue Q 4hrs  Escalate  MEWS: Escalate Red: discuss with charge nurse/RN and provider, consider discussing with RRT  Notify: Charge Nurse/RN  Name of Charge Nurse/RN Notified Brix Brearley RN  Date Charge Nurse/RN Notified 01/08/21  Time Charge Nurse/RN Notified 0329  Notify: Provider  Provider Name/Title Margie Ege  Date Provider Notified 01/07/21  Time Provider Notified 1530  Notification Type Page  Notification Reason Change in status  Provider response Evaluate remotely  Date of Provider Response 01/07/21  Time of Provider Response 1530    01/08/21 0426  Assess: MEWS Score  Temp (!) 103 F (39.4 C)  Assess: MEWS Score  MEWS Temp 2  MEWS Systolic 0  MEWS Pulse 2  MEWS RR 0  MEWS LOC 0  MEWS Score 4  MEWS Score Color Red  Assess: if the MEWS score is Yellow or Red  Were vital signs taken at a resting state? Yes  Focused Assessment No change from prior assessment  Early Detection of Sepsis Score *See Row Information* Low  MEWS guidelines implemented *See Row Information* Yes  Treat  MEWS Interventions Administered prn meds/treatments;Administered scheduled meds/treatments  Take Vital Signs  Increase Vital Sign Frequency  Red: Q 1hr X 4 then Q 4hr X 4, if  remains red, continue Q 4hrs  Escalate  MEWS: Escalate Red: discuss with charge nurse/RN and provider, consider discussing with RRT  Notify: Charge Nurse/RN  Name of Charge Nurse/RN Notified Fleetwood Pierron RN  Date Charge Nurse/RN Notified 01/08/21  Time Charge Nurse/RN Notified 0446  Notify: Provider  Provider Name/Title Dr Toniann Fail  Date Provider Notified 01/08/21  Time Provider Notified [phone]  Notification Type Page  Notification Reason Change in status  Provider response See new orders (MD ordered Advil 400mg  x1 dose)  Date of Provider Response 01/08/21  Time of Provider Response [phone]  Document  Patient Outcome  (continue to monitor)  Progress note created (see row info) Yes

## 2021-01-08 NOTE — Consult Note (Addendum)
Referral MD  Reason for Referral: Fever of undefined etiology with prominent spleen and prominent retroperitoneal lymph nodes  Chief Complaint  Patient presents with  . Pelvic Pain  . Fever  : I am just nauseated.  HPI: Alexandra Young is a very charming 20 year old woman.  She is part Caucasian part African-American.  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  She has been having issues with abdominal pain.  She was found recently to have what appeared to be a ruptured ovarian cyst.  She has had continued pain.  She had continued fever.  She was subsequently admitted.  She had a CT of the abdomen pelvis.  The radiologist said there was prominent spleen.  There was prominent retroperitoneal lymph nodes.  I am not sure exactly what all this means.  We are asked to see her to see if she has a lymphoproliferative process.  She is tested negative for mononucleosis.  All of her cultures were negative.  She did have some foreign travel in January to the [place].  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  I do not know if they may have given her some kind of virus.  I just have a hard time believing that she has any underlying lymphoproliferative process.  She has no palpable lymph nodes.  She has not lost weight.  She had no rash.  She has had no obvious change in bowel or bladder habits.  There is been no medication has been added.  CBC today shows white count 6.3.  Hemoglobin 12.5.  Platelet count 156,000.  Her albumin is 3.3.  Her BUN is less than 5 with a creatinine of 0.60.  Calcium is 8.3.  Her LDH is mildly elevated at 247.  She does not smoke.  She has occasional toke of marijuana.  She has rare alcohol use.  As far she knows, there is no history of sickle cell.  There is no hematologic malignancy in the family although she has had an aunt had leukemia when she was a child.  Currently she is having a lot of abdominal pain and nausea.   I would have to say that  her performance status is probably ECOG 1.      Past Medical History:  Diagnosis Date  . Anxiety   . Asthma   . Depression   . IBS (irritable bowel syndrome)   :  History reviewed. No pertinent surgical history.:   Current Facility-Administered Medications:  .  0.9 %  sodium chloride infusion, , Intravenous, Continuous, Kyle, Tyrone A, DO, Last Rate: 75 mL/hr at 01/08/21 0800, Infusion Verify at 01/08/21 0800 .  [START ON 01/09/2021] acetaminophen (TYLENOL) tablet 650 mg, 650 mg, Oral, Q6H PRN **OR** [START ON 01/09/2021] acetaminophen (TYLENOL) suppository 650 mg, 650 mg, Rectal, Q6H PRN, Mariel Aloe, MD .  acetaminophen (TYLENOL) tablet 650 mg, 650 mg, Oral, Q6H, Nettey, Ralph A, MD .  albuterol (VENTOLIN HFA) 108 (90 Base) MCG/ACT inhaler 2 puff, 2 puff, Inhalation, Q6H PRN, Marylyn Ishihara, Tyrone A, DO .  busPIRone (BUSPAR) tablet 10 mg, 10 mg, Oral, BID, Kyle, Tyrone A, DO, 10 mg at 01/08/21 1012 .  enoxaparin (LOVENOX) injection 40 mg, 40 mg, Subcutaneous, Q24H, Kyle, Tyrone A, DO, 40 mg at 01/08/21 1429 .  fluticasone (FLONASE) 50 MCG/ACT nasal spray 2 spray, 2 spray, Each Nare, Daily PRN, Marylyn Ishihara, Tyrone A, DO .  HYDROmorphone (DILAUDID) injection 0.5 mg, 0.5 mg, Intravenous, Q4H PRN, Cherylann Ratel  A, DO, 0.5 mg at 01/08/21 1428 .  lamoTRIgine (LAMICTAL) tablet 50 mg, 50 mg, Oral, Daily, Kyle, Tyrone A, DO, 50 mg at 01/08/21 1011 .  linaclotide (LINZESS) capsule 145 mcg, 145 mcg, Oral, Daily PRN, Marylyn Ishihara, Tyrone A, DO .  loperamide (IMODIUM) capsule 2 mg, 2 mg, Oral, Daily PRN, Marylyn Ishihara, Tyrone A, DO .  loratadine (CLARITIN) tablet 10 mg, 10 mg, Oral, Daily, Kyle, Tyrone A, DO, 10 mg at 01/08/21 1012 .  montelukast (SINGULAIR) tablet 10 mg, 10 mg, Oral, QHS, Kyle, Tyrone A, DO, 10 mg at 01/07/21 2105 .  ondansetron (ZOFRAN) tablet 4 mg, 4 mg, Oral, Q8H **OR** ondansetron (ZOFRAN) injection 4 mg, 4 mg, Intravenous, Q8H, Mycheal Veldhuizen R, MD .  oxyCODONE (Oxy IR/ROXICODONE) immediate release tablet  5 mg, 5 mg, Oral, Q4H PRN, Kyle, Tyrone A, DO, 5 mg at 01/08/21 1012 .  prochlorperazine (COMPAZINE) injection 10 mg, 10 mg, Intravenous, Q6H PRN, Volanda Napoleon, MD .  traZODone (DESYREL) tablet 50 mg, 50 mg, Oral, QHS PRN, Marylyn Ishihara, Tyrone A, DO:  . acetaminophen  650 mg Oral Q6H  . busPIRone  10 mg Oral BID  . enoxaparin (LOVENOX) injection  40 mg Subcutaneous Q24H  . lamoTRIgine  50 mg Oral Daily  . loratadine  10 mg Oral Daily  . montelukast  10 mg Oral QHS  . ondansetron (ZOFRAN) IV  4 mg Intravenous Q8H   Or  . ondansetron  4 mg Oral Q8H  :  No Known Allergies:  Family History  Problem Relation Age of Onset  . Asthma Mother   . Thyroid disease Mother   :  Social History   Socioeconomic History  . Marital status: Single    Spouse name: Not on file  . Number of children: Not on file  . Years of education: Not on file  . Highest education level: Not on file  Occupational History  . Not on file  Tobacco Use  . Smoking status: Never Smoker  . Smokeless tobacco: Never Used  Vaping Use  . Vaping Use: Never used  Substance and Sexual Activity  . Alcohol use: Never  . Drug use: Never  . Sexual activity: Yes    Birth control/protection: Condom  Other Topics Concern  . Not on file  Social History Narrative  . Not on file   Social Determinants of Health   Financial Resource Strain: Not on file  Food Insecurity: Not on file  Transportation Needs: Not on file  Physical Activity: Not on file  Stress: Not on file  Social Connections: Not on file  Intimate Partner Violence: Not on file  :  Review of Systems  Constitutional: Positive for fever and malaise/fatigue.  HENT: Negative.   Eyes: Negative.   Respiratory: Negative.   Cardiovascular: Negative.   Gastrointestinal: Positive for abdominal pain.  Genitourinary: Negative.   Musculoskeletal: Negative.   Skin: Negative.   Neurological: Negative.   Endo/Heme/Allergies: Negative.   Psychiatric/Behavioral:  Negative.      Exam:  Physical Exam Vitals reviewed.  HENT:     Head: Normocephalic and atraumatic.  Eyes:     Pupils: Pupils are equal, round, and reactive to light.  Cardiovascular:     Rate and Rhythm: Normal rate and regular rhythm.     Heart sounds: Normal heart sounds.  Pulmonary:     Effort: Pulmonary effort is normal.     Breath sounds: Normal breath sounds.  Abdominal:     General: Bowel sounds are normal.  Palpations: Abdomen is soft.  Musculoskeletal:        General: No tenderness or deformity. Normal range of motion.     Cervical back: Normal range of motion.  Lymphadenopathy:     Cervical: No cervical adenopathy.  Skin:    General: Skin is warm and dry.     Findings: No erythema or rash.  Neurological:     Mental Status: She is alert and oriented to person, place, and time.  Psychiatric:        Behavior: Behavior normal.        Thought Content: Thought content normal.        Judgment: Judgment normal.    Patient Vitals for the past 24 hrs:  BP Temp Temp src Pulse Resp SpO2  01/08/21 1333 111/72 99.2 F (37.3 C) Oral 93 18 100 %  01/08/21 1033 110/72 99 F (37.2 C) Oral 82 18 98 %  01/08/21 0628 109/66 99 F (37.2 C) Oral 88 18 97 %  01/08/21 0426 -- (!) 103 F (39.4 C) Oral -- -- --  01/08/21 0329 131/78 (!) 102.9 F (39.4 C) Oral (!) 112 18 100 %  01/07/21 2330 109/68 (!) 100.5 F (38.1 C) Oral (!) 108 18 99 %  01/07/21 1926 112/72 99.6 F (37.6 C) Oral (!) 104 18 99 %  01/07/21 1826 110/65 99.4 F (37.4 C) Oral (!) 106 18 98 %  01/07/21 1741 110/72 100.3 F (37.9 C) Oral (!) 107 18 96 %  01/07/21 1638 112/68 (!) 102.3 F (39.1 C) Oral (!) 120 20 98 %  01/07/21 1553 -- (!) 102.9 F (39.4 C) Oral (!) 104 -- --     Recent Labs    01/07/21 0619 01/08/21 0221  WBC 7.0 6.3  HGB 13.1 12.5  HCT 38.2 38.3  PLT 186 156   Recent Labs    01/07/21 0619 01/08/21 0221  NA 137 138  K 3.5 3.3*  CL 106 108  CO2 18* 20*  GLUCOSE 107* 83   BUN 6 <5*  CREATININE 0.56 0.62  CALCIUM 8.8* 8.3*    Blood smear review: Pending  Pathology: None    Assessment and Plan: Alexandra Young is a very nice 20 year old female.  She has fevers.  She had temperature of 103 yesterday.  Cultures were all negative.  We are sort of stuck because there is mentioned on her CT scan a prominent spleen and some prominent lymph nodes.  This is totally nonspecific.  I just have a hard time believing that Alexandra Young has anything hematologic or oncologic.  Unfortunately, we are also forced to have to consider a bone marrow on her.  Again I would hate to do this on 20 year old without having a really good reason to do one.  Again, she takes care of twins who are 21 years old.  I do notl know if they may have given her some kind of virus.  This  looks like some kind of viral syndrome.  I will have to look at her blood smear.  I will see if there is anything on the blood smear that suggest a hematologic malignancy.  I think a CT scan of the chest would be helpful.  We can see if there is a adenopathy in the hilum and mediastinum or axilla.  She is part African-American.  I think it would be worthwhile checking a hemoglobin electrophoresis.  Right now, we will just have to wait for all the studies to come back.  Again she seems very very nice.  I just hate the fact that she is in the hospital.  She is awful young to be in the hospital.  I just doubt that there is anything that we need to do right now.  However, a bone marrow biopsy certainly might be necessary.  Again, given that her blood counts are normal, I just would have a hard time believing that there is any bone marrow disorder that is causing all of her issues.  Lattie Haw, MD  Jeneen Rinks 1:5

## 2021-01-09 ENCOUNTER — Observation Stay (HOSPITAL_COMMUNITY): Payer: Medicaid Other

## 2021-01-09 ENCOUNTER — Encounter (HOSPITAL_COMMUNITY): Payer: Self-pay | Admitting: Internal Medicine

## 2021-01-09 DIAGNOSIS — R109 Unspecified abdominal pain: Secondary | ICD-10-CM

## 2021-01-09 DIAGNOSIS — R102 Pelvic and perineal pain: Secondary | ICD-10-CM | POA: Diagnosis not present

## 2021-01-09 DIAGNOSIS — R739 Hyperglycemia, unspecified: Secondary | ICD-10-CM | POA: Diagnosis not present

## 2021-01-09 DIAGNOSIS — R590 Localized enlarged lymph nodes: Secondary | ICD-10-CM | POA: Diagnosis not present

## 2021-01-09 DIAGNOSIS — R7401 Elevation of levels of liver transaminase levels: Secondary | ICD-10-CM

## 2021-01-09 DIAGNOSIS — R52 Pain, unspecified: Secondary | ICD-10-CM | POA: Diagnosis not present

## 2021-01-09 DIAGNOSIS — R5381 Other malaise: Secondary | ICD-10-CM | POA: Diagnosis not present

## 2021-01-09 DIAGNOSIS — R21 Rash and other nonspecific skin eruption: Secondary | ICD-10-CM | POA: Diagnosis not present

## 2021-01-09 DIAGNOSIS — I079 Rheumatic tricuspid valve disease, unspecified: Secondary | ICD-10-CM | POA: Diagnosis not present

## 2021-01-09 DIAGNOSIS — K7689 Other specified diseases of liver: Secondary | ICD-10-CM | POA: Diagnosis present

## 2021-01-09 DIAGNOSIS — R509 Fever, unspecified: Secondary | ICD-10-CM

## 2021-01-09 DIAGNOSIS — R161 Splenomegaly, not elsewhere classified: Secondary | ICD-10-CM

## 2021-01-09 DIAGNOSIS — Z825 Family history of asthma and other chronic lower respiratory diseases: Secondary | ICD-10-CM | POA: Diagnosis not present

## 2021-01-09 DIAGNOSIS — R1013 Epigastric pain: Secondary | ICD-10-CM | POA: Diagnosis present

## 2021-01-09 DIAGNOSIS — N760 Acute vaginitis: Secondary | ICD-10-CM | POA: Diagnosis not present

## 2021-01-09 DIAGNOSIS — R519 Headache, unspecified: Secondary | ICD-10-CM | POA: Diagnosis present

## 2021-01-09 DIAGNOSIS — R7881 Bacteremia: Secondary | ICD-10-CM | POA: Diagnosis not present

## 2021-01-09 DIAGNOSIS — Z20822 Contact with and (suspected) exposure to covid-19: Secondary | ICD-10-CM | POA: Diagnosis not present

## 2021-01-09 DIAGNOSIS — M79602 Pain in left arm: Secondary | ICD-10-CM | POA: Diagnosis not present

## 2021-01-09 DIAGNOSIS — Z8349 Family history of other endocrine, nutritional and metabolic diseases: Secondary | ICD-10-CM | POA: Diagnosis not present

## 2021-01-09 DIAGNOSIS — F3160 Bipolar disorder, current episode mixed, unspecified: Secondary | ICD-10-CM | POA: Diagnosis not present

## 2021-01-09 DIAGNOSIS — J45909 Unspecified asthma, uncomplicated: Secondary | ICD-10-CM | POA: Diagnosis present

## 2021-01-09 DIAGNOSIS — R112 Nausea with vomiting, unspecified: Secondary | ICD-10-CM | POA: Diagnosis present

## 2021-01-09 DIAGNOSIS — T50905A Adverse effect of unspecified drugs, medicaments and biological substances, initial encounter: Secondary | ICD-10-CM | POA: Diagnosis not present

## 2021-01-09 DIAGNOSIS — T380X5A Adverse effect of glucocorticoids and synthetic analogues, initial encounter: Secondary | ICD-10-CM | POA: Diagnosis not present

## 2021-01-09 DIAGNOSIS — K589 Irritable bowel syndrome without diarrhea: Secondary | ICD-10-CM | POA: Diagnosis not present

## 2021-01-09 DIAGNOSIS — M79601 Pain in right arm: Secondary | ICD-10-CM | POA: Diagnosis not present

## 2021-01-09 DIAGNOSIS — F410 Panic disorder [episodic paroxysmal anxiety] without agoraphobia: Secondary | ICD-10-CM | POA: Diagnosis not present

## 2021-01-09 DIAGNOSIS — F909 Attention-deficit hyperactivity disorder, unspecified type: Secondary | ICD-10-CM | POA: Diagnosis present

## 2021-01-09 DIAGNOSIS — N83299 Other ovarian cyst, unspecified side: Secondary | ICD-10-CM

## 2021-01-09 DIAGNOSIS — Z8619 Personal history of other infectious and parasitic diseases: Secondary | ICD-10-CM | POA: Diagnosis not present

## 2021-01-09 DIAGNOSIS — D7212 Drug rash with eosinophilia and systemic symptoms syndrome: Secondary | ICD-10-CM | POA: Diagnosis present

## 2021-01-09 DIAGNOSIS — R1032 Left lower quadrant pain: Secondary | ICD-10-CM | POA: Diagnosis not present

## 2021-01-09 DIAGNOSIS — R7989 Other specified abnormal findings of blood chemistry: Secondary | ICD-10-CM | POA: Diagnosis not present

## 2021-01-09 DIAGNOSIS — I82613 Acute embolism and thrombosis of superficial veins of upper extremity, bilateral: Secondary | ICD-10-CM | POA: Diagnosis not present

## 2021-01-09 LAB — EPSTEIN-BARR VIRUS (EBV) ANTIBODY PROFILE
EBV NA IgG: >600 U/mL — ABNORMAL HIGH (ref 0.0–17.9)
EBV VCA IgG: 384 U/mL — ABNORMAL HIGH (ref 0.0–17.9)
EBV VCA IgM: <36 U/mL (ref 0.0–35.9)

## 2021-01-09 LAB — COMPREHENSIVE METABOLIC PANEL
ALT: 48 U/L — ABNORMAL HIGH (ref 0–44)
AST: 42 U/L — ABNORMAL HIGH (ref 15–41)
Albumin: 3.4 g/dL — ABNORMAL LOW (ref 3.5–5.0)
Alkaline Phosphatase: 83 U/L (ref 38–126)
Anion gap: 12 (ref 5–15)
BUN: <5 mg/dL — ABNORMAL LOW (ref 6–20)
CO2: 12 mmol/L — ABNORMAL LOW (ref 22–32)
Calcium: 8.3 mg/dL — ABNORMAL LOW (ref 8.9–10.3)
Chloride: 111 mmol/L (ref 98–111)
Creatinine, Ser: 0.71 mg/dL (ref 0.44–1.00)
GFR, Estimated: >60 mL/min (ref >60)
Glucose, Bld: 71 mg/dL (ref 70–99)
Potassium: 3.6 mmol/L (ref 3.5–5.1)
Sodium: 135 mmol/L (ref 135–145)
Total Bilirubin: 1 mg/dL (ref 0.3–1.2)
Total Protein: 6.6 g/dL (ref 6.5–8.1)

## 2021-01-09 LAB — CBC
HCT: 36.7 % (ref 36.0–46.0)
Hemoglobin: 11.9 g/dL — ABNORMAL LOW (ref 12.0–15.0)
MCH: 28.1 pg (ref 26.0–34.0)
MCHC: 32.4 g/dL (ref 30.0–36.0)
MCV: 86.6 fL (ref 80.0–100.0)
Platelets: 170 10*3/uL (ref 150–400)
RBC: 4.24 MIL/uL (ref 3.87–5.11)
RDW: 13.3 % (ref 11.5–15.5)
WBC: 9.7 10*3/uL (ref 4.0–10.5)
nRBC: 0 % (ref 0.0–0.2)

## 2021-01-09 LAB — PATHOLOGIST SMEAR REVIEW

## 2021-01-09 LAB — HEPATITIS PANEL, ACUTE
HCV Ab: NONREACTIVE
Hep A IgM: NONREACTIVE
Hep B C IgM: NONREACTIVE
Hepatitis B Surface Ag: NONREACTIVE

## 2021-01-09 LAB — CMV ANTIBODY, IGG (EIA): CMV Ab - IgG: 2.8 U/mL — ABNORMAL HIGH (ref 0.00–0.59)

## 2021-01-09 LAB — SAVE SMEAR(SSMR), FOR PROVIDER SLIDE REVIEW

## 2021-01-09 LAB — CMV IGM: CMV IgM: <30 AU/mL (ref 0.0–29.9)

## 2021-01-09 LAB — LACTATE DEHYDROGENASE: LDH: 395 U/L — ABNORMAL HIGH (ref 98–192)

## 2021-01-09 MED ORDER — DIPHENHYDRAMINE HCL 25 MG PO CAPS
25.0000 mg | ORAL_CAPSULE | Freq: Four times a day (QID) | ORAL | Status: DC | PRN
  Administered 2021-01-10 – 2021-01-12 (×4): 25 mg via ORAL
  Filled 2021-01-09 (×4): qty 1

## 2021-01-09 MED ORDER — ALUM & MAG HYDROXIDE-SIMETH 200-200-20 MG/5ML PO SUSP
30.0000 mL | ORAL | Status: DC | PRN
  Administered 2021-01-09 – 2021-01-11 (×4): 30 mL via ORAL
  Filled 2021-01-09 (×4): qty 30

## 2021-01-09 MED ORDER — IOHEXOL 300 MG/ML  SOLN
100.0000 mL | Freq: Once | INTRAMUSCULAR | Status: AC | PRN
  Administered 2021-01-09: 75 mL via INTRAVENOUS

## 2021-01-09 NOTE — Consult Note (Signed)
Date of Admission:  01/07/2021          Reason for Consult: Fever, lymphadenopathy, splenomegaly and a rash    Referring Provider: Dr. Caleb PoppNettey   Assessment:  1. FUO 2. Pelvic pain, abdominal pain 3. Subtle splenomegaly and retroperitoneal lymphadenopathy 4. Ovarian cyst 5. New onset of rash 6. Mild transaminitis 7. Recent chlamydial infection in March  Plan:  1. Agree with labs that have been sent 2. We will check an HIV quant RNA in case this is acute HIV 3. Check a parvo virus antibody panel 4. Hepatitis panel 5. Will check acute (and later if diagnosis seems reasonable convalescent) [facility] and Ehrlichia titers 6. Check sed rate CRP rheumatoid factor ANA LDH 7. TTE 8. I will consider an empiric trial of doxycycline but I would like for serologies to come back first because she does not have a clinical picture that fits perfectly for RMSF or ehrlichiosis 9. Discussed preexposure prophylaxis for HIV and will discuss further with her once she is recovered from her acute illness (and provided she does not have HIV)  Principal Problem:   Intractable nausea and vomiting Active Problems:   Intractable pain   Pelvic pain   Fever of unknown origin   Splenomegaly   Scheduled Meds: . busPIRone  10 mg Oral BID  . enoxaparin (LOVENOX) injection  40 mg Subcutaneous Q24H  . lamoTRIgine  50 mg Oral Daily  . loratadine  10 mg Oral Daily  . montelukast  10 mg Oral QHS  . ondansetron (ZOFRAN) IV  4 mg Intravenous Q8H   Or  . ondansetron  4 mg Oral Q8H   Continuous Infusions: . sodium chloride 75 mL/hr at 01/09/21 0600   PRN Meds:.acetaminophen **OR** acetaminophen, albuterol, alum & mag hydroxide-simeth, diphenhydrAMINE, fluticasone, HYDROmorphone (DILAUDID) injection, linaclotide, loperamide, oxyCODONE, prochlorperazine, traZODone  HPI: Alexandra Young is a 20 y.o. female [place] black part white with history of IBS and anxiety presented to the ER with  abdominal pain and low-grade fevers on January 04, 2021.  At that point in time she was found to have an ovarian cyst on transvaginal ultrasound and instructed to take nonsteroidal anti-inflammatory drugs.  She returned on the 30th with ongoing fevers and worsening left-sided flank and abdominal pain.  CT of the abdomen pelvis was performed and showed some subtle possible splenomegaly as well as some prominent retroperitoneal lymph nodes.  Blood cultures were taken and were negative urine cultures grew inconsequential colony-forming units.  COVID-19 test and respiratory panel were negative  Her Monospot test was negative but EBV titers and CMV titers are not yet back.    Her fourth-generation HIV antibody antigen test is negative.  Her RPR is negative her gonorrhea chlamydia test and urine are negative.  She had cryptococcal antigen serum sent which was negative  Has had fevers and today she developed a diffuse rash most prominently on her back but also on her chest and arms.  We did pull a tick off of her pet pitbull at home but did not notice a tick on herself.  She did have chlamydial infection in March and is no longer been sexually active since she and her boyfriend at the time broke up.  Does also take care of 2 young children and certainly would be at risk for getting a viral infection from them.  Check a parvovirus antibody panel  Only when she recovers from her acute illness she would be someone that would benefit  from preexposure prophylaxis, given hx of chlamydia, given her geographic location and given her race.  Is have several family members who have malignancies including one relative who had leukemia diagnosed 10.  I think a leukemia or lymphoma does seem unlikely.      Review of Systems: Review of Systems  Constitutional: Positive for chills, fever and malaise/fatigue. Negative for weight loss.  HENT: Negative for congestion and sore throat.   Eyes: Negative  for blurred vision and photophobia.  Respiratory: Negative for cough, shortness of breath and wheezing.   Cardiovascular: Negative for chest pain, palpitations and leg swelling.  Gastrointestinal: Positive for abdominal pain. Negative for blood in stool, constipation, diarrhea, heartburn, melena, nausea and vomiting.  Genitourinary: Positive for flank pain and hematuria. Negative for dysuria.  Musculoskeletal: Negative for back pain, falls, joint pain and myalgias.  Skin: Negative for itching and rash.  Neurological: Negative for dizziness, focal weakness, loss of consciousness, weakness and headaches.  Endo/Heme/Allergies: Does not bruise/bleed easily.  Psychiatric/Behavioral: Negative for depression and suicidal ideas. The patient does not have insomnia.     Past Medical History:  Diagnosis Date  . Anxiety   . Asthma   . Depression   . IBS (irritable bowel syndrome)     Social History   Tobacco Use  . Smoking status: Never Smoker  . Smokeless tobacco: Never Used  Vaping Use  . Vaping Use: Never used  Substance Use Topics  . Alcohol use: Never  . Drug use: Never    Family History  Problem Relation Age of Onset  . Asthma Mother   . Thyroid disease Mother    No Known Allergies  OBJECTIVE: Blood pressure 101/66, pulse 96, temperature 98.6 F (37 C), temperature source Oral, resp. rate 20, height 5\' 2"  (1.575 m), weight 61.2 kg, last menstrual period 01/06/2021, SpO2 99 %.  Physical Exam Constitutional:      General: She is not in acute distress.    Appearance: She is not diaphoretic.  HENT:     Head: Normocephalic and atraumatic.     Right Ear: External ear normal.     Left Ear: External ear normal.     Nose: Nose normal.     Mouth/Throat:     Pharynx: No oropharyngeal exudate.  Eyes:     General: No scleral icterus.    Conjunctiva/sclera: Conjunctivae normal.     Pupils: Pupils are equal, round, and reactive to light.  Cardiovascular:     Rate and Rhythm:  Normal rate and regular rhythm.     Heart sounds: Normal heart sounds. No murmur heard. No friction rub. No gallop.   Pulmonary:     Effort: Pulmonary effort is normal. No respiratory distress.     Breath sounds: Normal breath sounds. No stridor. No wheezing, rhonchi or rales.  Abdominal:     General: Bowel sounds are normal. There is no distension.     Palpations: Abdomen is soft.     Tenderness: There is abdominal tenderness in the left upper quadrant and left lower quadrant. There is no rebound.  Musculoskeletal:        General: No tenderness. Normal range of motion.     Cervical back: Normal range of motion and neck supple. No rigidity.  Lymphadenopathy:     Cervical: No cervical adenopathy.  Skin:    General: Skin is warm and dry.     Coloration: Skin is not pale.     Findings: No erythema or rash.  Neurological:  General: No focal deficit present.     Mental Status: She is alert and oriented to person, place, and time.     Coordination: Coordination normal.  Psychiatric:        Mood and Affect: Mood normal.        Behavior: Behavior normal.        Thought Content: Thought content normal.        Judgment: Judgment normal.    Diffuse macular largely blanching rash most prominent on back see picture below 5 10/22/2020:     Lab Results Lab Results  Component Value Date   WBC 9.7 01/09/2021   HGB 11.9 (L) 01/09/2021   HCT 36.7 01/09/2021   MCV 86.6 01/09/2021   PLT 170 01/09/2021    Lab Results  Component Value Date   CREATININE 0.71 01/09/2021   BUN <5 (L) 01/09/2021   NA 135 01/09/2021   K 3.6 01/09/2021   CL 111 01/09/2021   CO2 12 (L) 01/09/2021    Lab Results  Component Value Date   ALT 48 (H) 01/09/2021   AST 42 (H) 01/09/2021   ALKPHOS 83 01/09/2021   BILITOT 1.0 01/09/2021     Microbiology: Recent Results (from the past 240 hour(s))  Resp Panel by RT-PCR (Flu A&B, Covid) Nasopharyngeal Swab     Status: None   Collection Time: 01/04/21  4:24  PM   Specimen: Nasopharyngeal Swab; Nasopharyngeal(NP) swabs in vial transport medium  Result Value Ref Range Status   SARS Coronavirus 2 by RT PCR NEGATIVE NEGATIVE Final    Comment: (NOTE) SARS-CoV-2 target nucleic acids are NOT DETECTED.  The SARS-CoV-2 RNA is generally detectable in upper respiratory specimens during the acute phase of infection. The lowest concentration of SARS-CoV-2 viral copies this assay can detect is 138 copies/mL. A negative result does not preclude SARS-Cov-2 infection and should not be used as the sole basis for treatment or other patient management decisions. A negative result may occur with  improper specimen collection/handling, submission of specimen other than nasopharyngeal swab, presence of viral mutation(s) within the areas targeted by this assay, and inadequate number of viral copies(<138 copies/mL). A negative result must be combined with clinical observations, patient history, and epidemiological information. The expected result is Negative.  Fact Sheet for Patients:  BloggerCourse.com  Fact Sheet for Healthcare Providers:  SeriousBroker.it  This test is no t yet approved or cleared by the [place] FDA and  has been authorized for detection and/or diagnosis of SARS-CoV-2 by FDA under an Emergency Use Authorization (EUA). This EUA will remain  in effect (meaning this test can be used) for the duration of the COVID-19 declaration under Section 564(b)(1) of the Act, 21 U.S.C.section 360bbb-3(b)(1), unless the authorization is terminated  or revoked sooner.       Influenza A by PCR NEGATIVE NEGATIVE Final   Influenza B by PCR NEGATIVE NEGATIVE Final    Comment: (NOTE) The Xpert Xpress SARS-CoV-2/FLU/RSV plus assay is intended as an aid in the diagnosis of influenza from Nasopharyngeal swab specimens and should not be used as a sole basis for treatment. Nasal washings and aspirates are  unacceptable for Xpert Xpress SARS-CoV-2/FLU/RSV testing.  Fact Sheet for Patients: BloggerCourse.com  Fact Sheet for Healthcare Providers: SeriousBroker.it  This test is not yet approved or cleared by the [place] FDA and has been authorized for detection and/or diagnosis of SARS-CoV-2 by FDA under an Emergency Use Authorization (EUA). This EUA will remain in effect (meaning this test can be  used) for the duration of the COVID-19 declaration under Section 564(b)(1) of the Act, 21 U.S.C. section 360bbb-3(b)(1), unless the authorization is terminated or revoked.  Performed at [facility], [address], [place], [place] [address]   Wet prep, genital     Status: Abnormal   Collection Time: 01/04/21  7:09 PM   Specimen: PATH Cytology Cervicovaginal Ancillary Only  Result Value Ref Range Status   Yeast Wet Prep HPF POC NONE SEEN NONE SEEN Final   Trich, Wet Prep NONE SEEN NONE SEEN Final   Clue Cells Wet Prep HPF POC PRESENT (A) NONE SEEN Final   WBC, Wet Prep HPF POC MANY (A) NONE SEEN Final   Sperm NONE SEEN  Final    Comment: Performed at [facility], [address], [place], [place] [address]  Urine culture     Status: Abnormal   Collection Time: 01/07/21  9:32 AM   Specimen: Urine, Catheterized  Result Value Ref Range Status   Specimen Description   Final    URINE, CATHETERIZED Performed at [facility], [address], [place], [place] [address]    Special Requests   Final    NONE Performed at [facility], [address] [place], [place] [address]    Culture (A)  Final    <10,000 COLONIES/mL INSIGNIFICANT GROWTH Performed at [facility], [address], [place], [place] [address]    Report Status 01/08/2021 FINAL  Final  Culture, blood (routine x 2)     Status: None (Preliminary result)   Collection Time: 01/07/21  1:39 PM    Specimen: BLOOD RIGHT HAND  Result Value Ref Range Status   Specimen Description   Final    BLOOD RIGHT HAND Performed at [facility], [address] [place], [place] [address]    Special Requests   Final    BOTTLES DRAWN AEROBIC ONLY Blood Culture adequate volume Performed at [facility], [address], [place], [place] [address]    Culture   Final    NO GROWTH 2 DAYS Performed at [facility], [address], [place], [place] [address]    Report Status PENDING  Incomplete  Culture, blood (routine x 2)     Status: None (Preliminary result)   Collection Time: 01/07/21  1:39 PM   Specimen: BLOOD LEFT HAND  Result Value Ref Range Status   Specimen Description   Final    BLOOD LEFT HAND Performed at [facility], [address], [place], [place] [address]    Special Requests   Final    BOTTLES DRAWN AEROBIC ONLY Blood Culture results may not be optimal due to an inadequate volume of blood received in culture bottles Performed at [facility], [address], [place], [place] [address]    Culture   Final    NO GROWTH 2 DAYS Performed at [facility], [address], [place], [place] [address]    Report Status PENDING  Incomplete    Acey Lav, MD [facility] [phone] pager  01/09/2021, 11:34 AM

## 2021-01-09 NOTE — Progress Notes (Signed)
PROGRESS NOTE    Alexandra Young  QQP:[id number] DOB: Mar 10, 2001 DOA: 01/07/2021 PCP: Aretha Parrot, NP   Brief Narrative: Alexandra Young is a 20 y.o. female with a history of IBS and anxiety. She presented secondary to left flank pain. She was found to have significant fevers and non-specific findings on CT scan including a prominent spleen and retroperitoneal lymph nodes.   Assessment & Plan:   Principal Problem:   Intractable nausea and vomiting Active Problems:   Intractable pain   Pelvic pain   Fever of unknown origin   Splenomegaly   Intractable nausea/vomiting Unknown etiology. No emesis this morning -Continue Zofran prn  Intractable pain Left pelvic pain Unsure of etiology. Imaging significant for left ovarian cyst. Presentation discussed with Gynecology on admission with no recommendation for continued Gynecology follow-up.  -Continue oxycodone/dilaudid prn  Fever of unknown origin Patient with continued fevers. Cryptococcal antigen, HIV, RPR negative. Procalcitonin low. No associated leukocytosis. -Scheduled Tylenol x24 hours -Follow-up blood cultures, EBV, CMV -Infectious disease consult  Rash Macular rash. Confluent. Unsure if related to viral illness vs drug eruption. -Benadryl prn  Splenomegaly Prominent per radiologist read. 12.7 cm in AP view. Family history of AML in her maternal aunt. Possibly related to viral illness.  IBS Stable  Anxiety Stable -Continue Buspar and Lamictal  Allergic rhinitis -Continue Singulair and Claritin   DVT prophylaxis: Lovenox Code Status:   Code Status: Full Code Family Communication: Mother on telephone Disposition Plan: Discharge likely 2+ days pending continued fever workup   Consultants:   Hematology/oncology  Infectious disease  Procedures:   None  Antimicrobials:  None    Subjective: Continued fevers. Rash noted last night that is itchy. She also noted a right posterior neck lymph node  that has not been painful.  Objective: Vitals:   01/08/21 1945 01/08/21 2346 01/09/21 0348 01/09/21 0736  BP: 104/68 120/74 103/62 101/66  Pulse: (!) 102 (!) 118 92 96  Resp: 19 18 18 20   Temp: 100.2 F (37.9 C) (!) 103 F (39.4 C) 98.9 F (37.2 C) 98.6 F (37 C)  TempSrc: Oral Oral Oral Oral  SpO2: 98% 100% 99% 99%  Weight:      Height:        Intake/Output Summary (Last 24 hours) at 01/09/2021 1045 Last data filed at 01/09/2021 0600 Gross per 24 hour  Intake 1977.83 ml  Output 1300 ml  Net 677.83 ml   Filed Weights   01/07/21 0600  Weight: 61.2 kg    Examination:  General exam: Appears calm and comfortable Neck: right posterior non-tender lymph node Respiratory system: Clear to auscultation. Respiratory effort normal. Cardiovascular system: S1 & S2 heard, RRR. No murmurs, rubs, gallops or clicks. Gastrointestinal system: Abdomen is nondistended, soft and mildly tender in upper quadrants. No organomegaly or masses felt. Normal bowel sounds heard. Central nervous system: Alert and oriented. No focal neurological deficits. Musculoskeletal: No edema. No calf tenderness Skin: No cyanosis. Macular/erythematous rash on anterior/posterior trunk.  Psychiatry: Judgement and insight appear normal. Mood & affect appropriate.    Data Reviewed: I have personally reviewed following labs and imaging studies  CBC Lab Results  Component Value Date   WBC 9.7 01/09/2021   RBC 4.24 01/09/2021   HGB 11.9 (L) 01/09/2021   HCT 36.7 01/09/2021   MCV 86.6 01/09/2021   MCH 28.1 01/09/2021   PLT 170 01/09/2021   MCHC 32.4 01/09/2021   RDW 13.3 01/09/2021   LYMPHSABS 0.9 01/04/2021   MONOABS 0.8 01/04/2021  EOSABS 0.5 01/04/2021   BASOSABS 0.1 01/04/2021     Last metabolic panel Lab Results  Component Value Date   NA 135 01/09/2021   K 3.6 01/09/2021   CL 111 01/09/2021   CO2 12 (L) 01/09/2021   BUN <5 (L) 01/09/2021   CREATININE 0.71 01/09/2021   GLUCOSE 71 01/09/2021    GFRNONAA >60 01/09/2021   CALCIUM 8.3 (L) 01/09/2021   PROT 6.6 01/09/2021   ALBUMIN 3.4 (L) 01/09/2021   BILITOT 1.0 01/09/2021   ALKPHOS 83 01/09/2021   AST 42 (H) 01/09/2021   ALT 48 (H) 01/09/2021   ANIONGAP 12 01/09/2021    CBG (last 3)  No results for input(s): GLUCAP in the last 72 hours.   GFR: Estimated Creatinine Clearance: 95.7 mL/min (by C-G formula based on SCr of 0.71 mg/dL).  Coagulation Profile: No results for input(s): INR, PROTIME in the last 168 hours.  Recent Results (from the past 240 hour(s))  Resp Panel by RT-PCR (Flu A&B, Covid) Nasopharyngeal Swab     Status: None   Collection Time: 01/04/21  4:24 PM   Specimen: Nasopharyngeal Swab; Nasopharyngeal(NP) swabs in vial transport medium  Result Value Ref Range Status   SARS Coronavirus 2 by RT PCR NEGATIVE NEGATIVE Final    Comment: (NOTE) SARS-CoV-2 target nucleic acids are NOT DETECTED.  The SARS-CoV-2 RNA is generally detectable in upper respiratory specimens during the acute phase of infection. The lowest concentration of SARS-CoV-2 viral copies this assay can detect is 138 copies/mL. A negative result does not preclude SARS-Cov-2 infection and should not be used as the sole basis for treatment or other patient management decisions. A negative result may occur with  improper specimen collection/handling, submission of specimen other than nasopharyngeal swab, presence of viral mutation(s) within the areas targeted by this assay, and inadequate number of viral copies(<138 copies/mL). A negative result must be combined with clinical observations, patient history, and epidemiological information. The expected result is Negative.  Fact Sheet for Patients:  BloggerCourse.com  Fact Sheet for Healthcare Providers:  SeriousBroker.it  This test is no t yet approved or cleared by the [place] FDA and  has been authorized for detection and/or diagnosis  of SARS-CoV-2 by FDA under an Emergency Use Authorization (EUA). This EUA will remain  in effect (meaning this test can be used) for the duration of the COVID-19 declaration under Section 564(b)(1) of the Act, 21 U.S.C.section 360bbb-3(b)(1), unless the authorization is terminated  or revoked sooner.       Influenza A by PCR NEGATIVE NEGATIVE Final   Influenza B by PCR NEGATIVE NEGATIVE Final    Comment: (NOTE) The Xpert Xpress SARS-CoV-2/FLU/RSV plus assay is intended as an aid in the diagnosis of influenza from Nasopharyngeal swab specimens and should not be used as a sole basis for treatment. Nasal washings and aspirates are unacceptable for Xpert Xpress SARS-CoV-2/FLU/RSV testing.  Fact Sheet for Patients: BloggerCourse.com  Fact Sheet for Healthcare Providers: SeriousBroker.it  This test is not yet approved or cleared by the [place] FDA and has been authorized for detection and/or diagnosis of SARS-CoV-2 by FDA under an Emergency Use Authorization (EUA). This EUA will remain in effect (meaning this test can be used) for the duration of the COVID-19 declaration under Section 564(b)(1) of the Act, 21 U.S.C. section 360bbb-3(b)(1), unless the authorization is terminated or revoked.  Performed at [facility], [address], [place], [place] [address]   Wet prep, genital     Status: Abnormal  Collection Time: 01/04/21  7:09 PM   Specimen: PATH Cytology Cervicovaginal Ancillary Only  Result Value Ref Range Status   Yeast Wet Prep HPF POC NONE SEEN NONE SEEN Final   Trich, Wet Prep NONE SEEN NONE SEEN Final   Clue Cells Wet Prep HPF POC PRESENT (A) NONE SEEN Final   WBC, Wet Prep HPF POC MANY (A) NONE SEEN Final   Sperm NONE SEEN  Final    Comment: Performed at [facility], [address] [place], [place] [address]  Urine culture     Status: Abnormal   Collection Time:  01/07/21  9:32 AM   Specimen: Urine, Catheterized  Result Value Ref Range Status   Specimen Description   Final    URINE, CATHETERIZED Performed at [facility], [address], [place], [place] [address]    Special Requests   Final    NONE Performed at [facility], [address], [place], [place] [address]    Culture (A)  Final    <10,000 COLONIES/mL INSIGNIFICANT GROWTH Performed at [facility], [address], [place], [place] [address]    Report Status 01/08/2021 FINAL  Final  Culture, blood (routine x 2)     Status: None (Preliminary result)   Collection Time: 01/07/21  1:39 PM   Specimen: BLOOD RIGHT HAND  Result Value Ref Range Status   Specimen Description   Final    BLOOD RIGHT HAND Performed at [facility], [address], [place], [place] [address]    Special Requests   Final    BOTTLES DRAWN AEROBIC ONLY Blood Culture adequate volume Performed at [facility], [address], [place], [place] [address]    Culture   Final    NO GROWTH 2 DAYS Performed at [facility], [address] [place], [place] [address]    Report Status PENDING  Incomplete  Culture, blood (routine x 2)     Status: None (Preliminary result)   Collection Time: 01/07/21  1:39 PM   Specimen: BLOOD LEFT HAND  Result Value Ref Range Status   Specimen Description   Final    BLOOD LEFT HAND Performed at [facility], [address] [place], [place] [address]    Special Requests   Final    BOTTLES DRAWN AEROBIC ONLY Blood Culture results may not be optimal due to an inadequate volume of blood received in culture bottles Performed at [facility], [address], [place], [place] [address]    Culture   Final    NO GROWTH 2 DAYS Performed at [facility], [address] [place], [place] [address]    Report Status PENDING  Incomplete        Radiology  Studies: CT CHEST W CONTRAST  Result Date: 01/09/2021 CLINICAL DATA:  Fever of unknown origin, prominent spleen EXAM: CT CHEST WITH CONTRAST TECHNIQUE: Multidetector CT imaging of the chest was performed during intravenous contrast administration. CONTRAST:  76mL OMNIPAQUE IOHEXOL 300 MG/ML  SOLN COMPARISON:  None. FINDINGS: Cardiovascular: Heart is normal in size.  No pericardial effusion. No evidence of thoracic aortic aneurysm. Mediastinum/Nodes: No suspicious mediastinal lymphadenopathy. Small bilateral axillary nodes measuring up to 8 mm short axis on the left (series 2/image 26), likely reactive. Triangular soft tissue in the anterior mediastinum (series 2/image 49) reflects residual thymus. Visualized thyroid is unremarkable. Lungs/Pleura: Lungs are clear. No suspicious pulmonary nodules. No focal consolidation.  Trace right pleural effusion (series 2/image 110). No pneumothorax. Upper Abdomen: Visualized upper abdomen is unchanged from recent CT abdomen/pelvis, noting a prominent spleen. Musculoskeletal: Visualized osseous structures are within normal limits. IMPRESSION: Trace right pleural effusion. Otherwise negative CT chest. Electronically Signed   By: Charline BillsSriyesh  Krishnan M.D.   On: 01/09/2021 10:39   US Abdomen Limited  Result Date: 01/09/2021 CLINICAL DATA:  Left upper quadrant pain EXAM: ULTRASOUND SPLEEN COMPARISON:  None. FINDINGS: Spleen measures 13.3 x 8.1 x 11.9 cm with a measures splenic volume of 670 cubic cm. No focal splenic lesions are evident. No perisplenic fluid or adenopathy. IMPRESSION: Prominent spleen without focal splenic lesion evident. Electronically Signed   By: Bretta BangWilliam  Woodruff III M.D.   On: 01/09/2021 07:56        Scheduled Meds: . busPIRone  10 mg Oral BID  . enoxaparin (LOVENOX) injection  40 mg Subcutaneous Q24H  . lamoTRIgine  50 mg Oral Daily  . loratadine  10 mg Oral Daily  . montelukast  10 mg Oral QHS  . ondansetron (ZOFRAN) IV  4 mg Intravenous Q8H    Or  . ondansetron  4 mg Oral Q8H   Continuous Infusions: . sodium chloride 75 mL/hr at 01/09/21 0600     LOS: 0 days     Jacquelin Hawkingalph Marcellino Fidalgo, MD [facility] 01/09/2021, 10:45 AM  If 7PM-7AM, please contact night-coverage [email or web address]

## 2021-01-10 ENCOUNTER — Inpatient Hospital Stay (HOSPITAL_COMMUNITY): Payer: Medicaid Other

## 2021-01-10 DIAGNOSIS — I079 Rheumatic tricuspid valve disease, unspecified: Secondary | ICD-10-CM

## 2021-01-10 DIAGNOSIS — R52 Pain, unspecified: Secondary | ICD-10-CM | POA: Diagnosis not present

## 2021-01-10 DIAGNOSIS — R112 Nausea with vomiting, unspecified: Secondary | ICD-10-CM | POA: Diagnosis not present

## 2021-01-10 DIAGNOSIS — R1013 Epigastric pain: Secondary | ICD-10-CM

## 2021-01-10 DIAGNOSIS — R509 Fever, unspecified: Secondary | ICD-10-CM | POA: Diagnosis not present

## 2021-01-10 DIAGNOSIS — R7989 Other specified abnormal findings of blood chemistry: Secondary | ICD-10-CM | POA: Diagnosis not present

## 2021-01-10 DIAGNOSIS — R7401 Elevation of levels of liver transaminase levels: Secondary | ICD-10-CM

## 2021-01-10 DIAGNOSIS — R21 Rash and other nonspecific skin eruption: Secondary | ICD-10-CM

## 2021-01-10 DIAGNOSIS — R102 Pelvic and perineal pain: Secondary | ICD-10-CM | POA: Diagnosis not present

## 2021-01-10 DIAGNOSIS — K589 Irritable bowel syndrome without diarrhea: Secondary | ICD-10-CM

## 2021-01-10 DIAGNOSIS — R109 Unspecified abdominal pain: Secondary | ICD-10-CM

## 2021-01-10 LAB — EPSTEIN-BARR VIRUS (EBV) ANTIBODY PROFILE
EBV NA IgG: >600 U/mL — ABNORMAL HIGH (ref 0.0–17.9)
EBV VCA IgG: 420 U/mL — ABNORMAL HIGH (ref 0.0–17.9)
EBV VCA IgM: <36 U/mL (ref 0.0–35.9)

## 2021-01-10 LAB — COMPREHENSIVE METABOLIC PANEL
ALT: 89 U/L — ABNORMAL HIGH (ref 0–44)
AST: 73 U/L — ABNORMAL HIGH (ref 15–41)
Albumin: 3.4 g/dL — ABNORMAL LOW (ref 3.5–5.0)
Alkaline Phosphatase: 131 U/L — ABNORMAL HIGH (ref 38–126)
Anion gap: 9 (ref 5–15)
BUN: <5 mg/dL — ABNORMAL LOW (ref 6–20)
CO2: 16 mmol/L — ABNORMAL LOW (ref 22–32)
Calcium: 8.3 mg/dL — ABNORMAL LOW (ref 8.9–10.3)
Chloride: 110 mmol/L (ref 98–111)
Creatinine, Ser: 0.56 mg/dL (ref 0.44–1.00)
GFR, Estimated: >60 mL/min (ref >60)
Glucose, Bld: 120 mg/dL — ABNORMAL HIGH (ref 70–99)
Potassium: 3.6 mmol/L (ref 3.5–5.1)
Sodium: 135 mmol/L (ref 135–145)
Total Bilirubin: 0.6 mg/dL (ref 0.3–1.2)
Total Protein: 6.7 g/dL (ref 6.5–8.1)

## 2021-01-10 LAB — HIV-1 RNA QUANT-NO REFLEX-BLD
HIV 1 RNA Quant: <20 copies/mL
LOG10 HIV-1 RNA: UNDETERMINED log10copy/mL

## 2021-01-10 LAB — RPR: RPR Ser Ql: NONREACTIVE

## 2021-01-10 LAB — CBC WITH DIFFERENTIAL/PLATELET
Abs Immature Granulocytes: 0.08 10*3/uL — ABNORMAL HIGH (ref 0.00–0.07)
Basophils Absolute: 0.2 10*3/uL — ABNORMAL HIGH (ref 0.0–0.1)
Basophils Relative: 2 %
Eosinophils Absolute: 0.8 10*3/uL — ABNORMAL HIGH (ref 0.0–0.5)
Eosinophils Relative: 8 %
HCT: 37 % (ref 36.0–46.0)
Hemoglobin: 12.2 g/dL (ref 12.0–15.0)
Immature Granulocytes: 1 %
Lymphocytes Relative: 57 %
Lymphs Abs: 6 10*3/uL — ABNORMAL HIGH (ref 0.7–4.0)
MCH: 27.7 pg (ref 26.0–34.0)
MCHC: 33 g/dL (ref 30.0–36.0)
MCV: 84.1 fL (ref 80.0–100.0)
Monocytes Absolute: 0.5 10*3/uL (ref 0.1–1.0)
Monocytes Relative: 5 %
Neutro Abs: 2.7 10*3/uL (ref 1.7–7.7)
Neutrophils Relative %: 27 %
Platelets: 173 10*3/uL (ref 150–400)
RBC: 4.4 MIL/uL (ref 3.87–5.11)
RDW: 13.3 % (ref 11.5–15.5)
WBC: 10.3 10*3/uL (ref 4.0–10.5)
nRBC: 0 % (ref 0.0–0.2)

## 2021-01-10 LAB — EHRLICHIA ANTIBODY PANEL
E chaffeensis (HGE) Ab, IgG: NEGATIVE
E chaffeensis (HGE) Ab, IgM: NEGATIVE
E. Chaffeensis (HME) IgM Titer: NEGATIVE
E.Chaffeensis (HME) IgG: NEGATIVE

## 2021-01-10 LAB — ECHOCARDIOGRAM COMPLETE
Area-P 1/2: 3.85 cm2
Height: 62 in
S' Lateral: 2.7 cm
Weight: 2160 oz

## 2021-01-10 LAB — ANTINUCLEAR ANTIBODIES, IFA: ANA Ab, IFA: NEGATIVE

## 2021-01-10 LAB — IGG, IGA, IGM
IgA: 196 mg/dL (ref 87–352)
IgG (Immunoglobin G), Serum: 855 mg/dL (ref 586–1602)
IgM (Immunoglobulin M), Srm: 103 mg/dL (ref 26–217)

## 2021-01-10 LAB — CMV ANTIBODY, IGG (EIA): CMV Ab - IgG: 3.7 U/mL — ABNORMAL HIGH (ref 0.00–0.59)

## 2021-01-10 LAB — PARVOVIRUS B19 ANTIBODY, IGG AND IGM
Parovirus B19 IgG Abs: 5.4 index — ABNORMAL HIGH (ref 0.0–0.8)
Parovirus B19 IgM Abs: 0.1 index (ref 0.0–0.8)

## 2021-01-10 LAB — BETA 2 MICROGLOBULIN, SERUM: Beta-2 Microglobulin: 2.2 mg/L (ref 0.6–2.4)

## 2021-01-10 LAB — CMV IGM: CMV IgM: <30 AU/mL (ref 0.0–29.9)

## 2021-01-10 LAB — RHEUMATOID FACTOR: Rheumatoid fact SerPl-aCnc: 10.2 IU/mL (ref <14.0)

## 2021-01-10 LAB — LIPASE, BLOOD: Lipase: 77 U/L — ABNORMAL HIGH (ref 11–51)

## 2021-01-10 MED ORDER — SODIUM CHLORIDE 0.9% FLUSH: 10.0000 mL | INTRAVENOUS | Status: DC | PRN

## 2021-01-10 MED ORDER — SODIUM CHLORIDE 0.9% FLUSH
10.0000 mL | Freq: Two times a day (BID) | INTRAVENOUS | Status: DC
  Administered 2021-01-11 – 2021-01-20 (×16): 10 mL

## 2021-01-10 MED ORDER — FLUCONAZOLE 150 MG PO TABS
150.0000 mg | ORAL_TABLET | Freq: Once | ORAL | Status: AC
  Administered 2021-01-10: 150 mg via ORAL
  Filled 2021-01-10: qty 1

## 2021-01-10 MED ORDER — KETOROLAC TROMETHAMINE 30 MG/ML IJ SOLN
30.0000 mg | Freq: Four times a day (QID) | INTRAMUSCULAR | Status: DC | PRN
  Administered 2021-01-10 – 2021-01-11 (×4): 30 mg via INTRAVENOUS
  Filled 2021-01-10 (×4): qty 1

## 2021-01-10 MED ORDER — MORPHINE SULFATE (PF) 2 MG/ML IV SOLN
2.0000 mg | INTRAVENOUS | Status: DC | PRN
Start: 2021-01-10 — End: 2021-01-18
  Administered 2021-01-10 – 2021-01-11 (×2): 2 mg via INTRAVENOUS
  Filled 2021-01-10 (×2): qty 1

## 2021-01-10 NOTE — Progress Notes (Addendum)
   01/10/21 1949  Assess: MEWS Score  Temp (!) 100.8 F (38.2 C)  BP 114/76  Pulse Rate (!) 114  Resp 17  Level of Consciousness Alert  SpO2 98 %  O2 Device Room Air  Patient Activity (if Appropriate) In bed  Assess: MEWS Score  MEWS Temp 1  MEWS Systolic 0  MEWS Pulse 2  MEWS RR 0  MEWS LOC 0  MEWS Score 3  MEWS Score Color Yellow  Assess: if the MEWS score is Yellow or Red  Were vital signs taken at a resting state? Yes  Focused Assessment No change from prior assessment  Early Detection of Sepsis Score *See Row Information* Low  MEWS guidelines implemented *See Row Information* No, previously red, continue vital signs every 4 hours  Treat  MEWS Interventions Administered prn meds/treatments  Pain Scale 0-10  Pain Score 8  Take Vital Signs  Increase Vital Sign Frequency  Yellow: Q 2hr X 2 then Q 4hr X 2, if remains yellow, continue Q 4hrs  Escalate  MEWS: Escalate Yellow: discuss with charge nurse/RN and consider discussing with provider and RRT  Notify: Charge Nurse/RN  Name of Charge Nurse/RN Notified pam williams  Date Charge Nurse/RN Notified 01/10/21  Time Charge Nurse/RN Notified 1949  Document  Patient Outcome Other (Comment)  Progress note created (see row info) Yes   Pt stable at this time able to ambulate to rest room with one person assist. No change from previous assessment findings.

## 2021-01-10 NOTE — Plan of Care (Signed)
Plan of care reviewed and discussed with the patient. 

## 2021-01-10 NOTE — Progress Notes (Signed)
Subjective: Her curve is improving though rash is worsening.   Antibiotics:  Anti-infectives (From admission, onward)   Start     Dose/Rate Route Frequency Ordered Stop   01/10/21 1645  fluconazole (DIFLUCAN) tablet 150 mg        150 mg Oral  Once 01/10/21 1554     01/07/21 0715  cefTRIAXone (ROCEPHIN) 1 g in sodium chloride 0.9 % 100 mL IVPB        1 g 200 mL/hr over 30 Minutes Intravenous  Once 01/07/21 0710 01/07/21 0751      Medications: Scheduled Meds: . busPIRone  10 mg Oral BID  . enoxaparin (LOVENOX) injection  40 mg Subcutaneous Q24H  . fluconazole  150 mg Oral Once  . lamoTRIgine  50 mg Oral Daily  . loratadine  10 mg Oral Daily  . montelukast  10 mg Oral QHS  . ondansetron (ZOFRAN) IV  4 mg Intravenous Q8H   Or  . ondansetron  4 mg Oral Q8H   Continuous Infusions: . sodium chloride 75 mL/hr at 01/10/21 1000   PRN Meds:.acetaminophen **OR** acetaminophen, albuterol, alum & mag hydroxide-simeth, diphenhydrAMINE, fluticasone, HYDROmorphone (DILAUDID) injection, linaclotide, loperamide, oxyCODONE, prochlorperazine, traZODone    Objective: Weight change:   Intake/Output Summary (Last 24 hours) at 01/10/2021 1621 Last data filed at 01/10/2021 1400 Gross per 24 hour  Intake 3285.58 ml  Output --  Net 3285.58 ml   Blood pressure 115/75, pulse (!) 106, temperature (!) 97.5 F (36.4 C), temperature source Oral, resp. rate 17, height $RemoveBe'5\' 2"'uHSPdFAPt$  (1.575 m), weight 61.2 kg, last menstrual period 01/06/2021, SpO2 98 %. Temp:  [97.5 F (36.4 C)-101 F (38.3 C)] 97.5 F (36.4 C) (05/03 1454) Pulse Rate:  [103-117] 106 (05/03 1454) Resp:  [16-20] 17 (05/03 1454) BP: (104-117)/(54-78) 115/75 (05/03 1454) SpO2:  [98 %-100 %] 98 % (05/03 1454)  Physical Exam: Physical Exam Constitutional:      General: She is not in acute distress.    Appearance: She is well-developed. She is not diaphoretic.  HENT:     Head: Normocephalic and atraumatic.     Right Ear:  External ear normal.     Left Ear: External ear normal.     Mouth/Throat:     Pharynx: No oropharyngeal exudate.  Eyes:     General: No scleral icterus.    Conjunctiva/sclera: Conjunctivae normal.     Pupils: Pupils are equal, round, and reactive to light.  Cardiovascular:     Rate and Rhythm: Normal rate and regular rhythm.     Heart sounds: Normal heart sounds. No murmur heard. No friction rub. No gallop.   Pulmonary:     Effort: Pulmonary effort is normal. No respiratory distress.     Breath sounds: Normal breath sounds. No wheezing or rales.  Abdominal:     General: Bowel sounds are normal. There is no distension.     Palpations: Abdomen is soft.     Tenderness: There is no abdominal tenderness. There is no rebound.  Musculoskeletal:        General: No tenderness. Normal range of motion.  Lymphadenopathy:     Cervical: No cervical adenopathy.  Skin:    General: Skin is warm and dry.     Coloration: Skin is not pale.     Findings: Rash present. No erythema.  Neurological:     Mental Status: She is alert and oriented to person, place, and time.     Motor: No abnormal  muscle tone.     Coordination: Coordination normal.  Psychiatric:        Mood and Affect: Mood normal.        Behavior: Behavior normal.        Thought Content: Thought content normal.        Judgment: Judgment normal.     Rash  01/09/2021:       01/10/2021:        Tongue today 01/10/2021 (she says she also did burn her tongue w soup)      CBC:    BMET Recent Labs    01/09/21 0311 01/10/21 0257  NA 135 135  K 3.6 3.6  CL 111 110  CO2 12* 16*  GLUCOSE 71 120*  BUN <5* <5*  CREATININE 0.71 0.56  CALCIUM 8.3* 8.3*     Liver Panel  Recent Labs    01/09/21 0311 01/10/21 0257  PROT 6.6 6.7  ALBUMIN 3.4* 3.4*  AST 42* 73*  ALT 48* 89*  ALKPHOS 83 131*  BILITOT 1.0 0.6       Sedimentation Rate No results for input(s): ESRSEDRATE in the last 72 hours. C-Reactive  Protein No results for input(s): CRP in the last 72 hours.  Micro Results: Recent Results (from the past 720 hour(s))  Resp Panel by RT-PCR (Flu A&B, Covid) Nasopharyngeal Swab     Status: None   Collection Time: 01/04/21  4:24 PM   Specimen: Nasopharyngeal Swab; Nasopharyngeal(NP) swabs in vial transport medium  Result Value Ref Range Status   SARS Coronavirus 2 by RT PCR NEGATIVE NEGATIVE Final    Comment: (NOTE) SARS-CoV-2 target nucleic acids are NOT DETECTED.  The SARS-CoV-2 RNA is generally detectable in upper respiratory specimens during the acute phase of infection. The lowest concentration of SARS-CoV-2 viral copies this assay can detect is 138 copies/mL. A negative result does not preclude SARS-Cov-2 infection and should not be used as the sole basis for treatment or other patient management decisions. A negative result may occur with  improper specimen collection/handling, submission of specimen other than nasopharyngeal swab, presence of viral mutation(s) within the areas targeted by this assay, and inadequate number of viral copies(<138 copies/mL). A negative result must be combined with clinical observations, patient history, and epidemiological information. The expected result is Negative.  Fact Sheet for Patients:  EntrepreneurPulse.com.au  Fact Sheet for Healthcare Providers:  IncredibleEmployment.be  This test is no t yet approved or cleared by the [place] FDA and  has been authorized for detection and/or diagnosis of SARS-CoV-2 by FDA under an Emergency Use Authorization (EUA). This EUA will remain  in effect (meaning this test can be used) for the duration of the COVID-19 declaration under Section 564(b)(1) of the Act, 21 U.S.C.section 360bbb-3(b)(1), unless the authorization is terminated  or revoked sooner.       Influenza A by PCR NEGATIVE NEGATIVE Final   Influenza B by PCR NEGATIVE NEGATIVE Final    Comment:  (NOTE) The Xpert Xpress SARS-CoV-2/FLU/RSV plus assay is intended as an aid in the diagnosis of influenza from Nasopharyngeal swab specimens and should not be used as a sole basis for treatment. Nasal washings and aspirates are unacceptable for Xpert Xpress SARS-CoV-2/FLU/RSV testing.  Fact Sheet for Patients: EntrepreneurPulse.com.au  Fact Sheet for Healthcare Providers: IncredibleEmployment.be  This test is not yet approved or cleared by the [place] FDA and has been authorized for detection and/or diagnosis of SARS-CoV-2 by FDA under an Emergency Use Authorization (EUA). This EUA will remain  in effect (meaning this test can be used) for the duration of the COVID-19 declaration under Section 564(b)(1) of the Act, 21 U.S.C. section 360bbb-3(b)(1), unless the authorization is terminated or revoked.  Performed at [facility], [place] [address] [place], [place] [address]   Wet prep, genital     Status: Abnormal   Collection Time: 01/04/21  7:09 PM   Specimen: PATH Cytology Cervicovaginal Ancillary Only  Result Value Ref Range Status   Yeast Wet Prep HPF POC NONE SEEN NONE SEEN Final   Trich, Wet Prep NONE SEEN NONE SEEN Final   Clue Cells Wet Prep HPF POC PRESENT (A) NONE SEEN Final   WBC, Wet Prep HPF POC MANY (A) NONE SEEN Final   Sperm NONE SEEN  Final    Comment: Performed at [facility], [place] [address], [place], [place] [address]  Urine culture     Status: Abnormal   Collection Time: 01/07/21  9:32 AM   Specimen: Urine, Catheterized  Result Value Ref Range Status   Specimen Description   Final    URINE, CATHETERIZED Performed at [place] [address] [place], [place] [address]    Special Requests   Final    NONE Performed at [facility], [place] [address] [place], [place] [address]    Culture (A)  Final    <10,000 COLONIES/mL INSIGNIFICANT  GROWTH Performed at [place] [address], [place], [place] [address]    Report Status 01/08/2021 FINAL  Final  Culture, blood (routine x 2)     Status: None (Preliminary result)   Collection Time: 01/07/21  1:39 PM   Specimen: BLOOD RIGHT HAND  Result Value Ref Range Status   Specimen Description   Final    BLOOD RIGHT HAND Performed at [place] [address], [place], [place] [address]    Special Requests   Final    BOTTLES DRAWN AEROBIC ONLY Blood Culture adequate volume Performed at [place] [address] [place], [place] [address]    Culture   Final    NO GROWTH 3 DAYS Performed at [facility], [place] [address], [place], [place] [address]    Report Status PENDING  Incomplete  Culture, blood (routine x 2)     Status: None (Preliminary result)   Collection Time: 01/07/21  1:39 PM   Specimen: BLOOD LEFT HAND  Result Value Ref Range Status   Specimen Description   Final    BLOOD LEFT HAND Performed at [place] [address], [place], [place] [address]    Special Requests   Final    BOTTLES DRAWN AEROBIC ONLY Blood Culture results may not be optimal due to an inadequate volume of blood received in culture bottles Performed at [place] [address] [place], [place] [address]    Culture   Final    NO GROWTH 3 DAYS Performed at [facility], [place] [address], [place], [place] [address]    Report Status PENDING  Incomplete    Studies/Results: CT CHEST W CONTRAST  Result Date: 01/09/2021 CLINICAL DATA:  Fever of unknown origin, prominent spleen EXAM: CT CHEST WITH CONTRAST TECHNIQUE: Multidetector CT imaging of the chest was performed during intravenous contrast administration. CONTRAST:  67mL OMNIPAQUE IOHEXOL 300 MG/ML  SOLN COMPARISON:  None. FINDINGS: Cardiovascular: Heart is normal in size.  No pericardial effusion. No evidence of thoracic aortic  aneurysm. Mediastinum/Nodes: No suspicious mediastinal lymphadenopathy. Small bilateral axillary nodes measuring up to 8 mm short axis on the left (series 2/image 26), likely reactive. Triangular soft tissue in the anterior mediastinum (series 2/image 49) reflects residual thymus. Visualized thyroid is unremarkable. Lungs/Pleura: Lungs are clear. No suspicious pulmonary nodules. No focal consolidation. Trace right pleural effusion (series 2/image 110). No pneumothorax. Upper Abdomen: Visualized upper abdomen is unchanged from recent CT abdomen/pelvis, noting a prominent spleen. Musculoskeletal: Visualized osseous structures are within normal limits. IMPRESSION: Trace right pleural effusion. Otherwise negative CT chest. Electronically Signed   By: Julian Hy M.D.   On: 01/09/2021 10:39   US Abdomen Limited  Result Date: 01/09/2021 CLINICAL DATA:  Left upper quadrant pain EXAM: ULTRASOUND SPLEEN COMPARISON:  None. FINDINGS: Spleen measures 13.3 x 8.1 x 11.9 cm with a measures splenic volume of 670 cubic cm. No focal splenic lesions are evident. No perisplenic fluid or adenopathy. IMPRESSION: Prominent spleen without focal splenic lesion evident. Electronically Signed   By: Lowella Grip III M.D.   On: 01/09/2021 07:56   ECHOCARDIOGRAM COMPLETE  Result Date: 01/10/2021    ECHOCARDIOGRAM REPORT   Patient Name:   [facility] Date of Exam: 01/10/2021 Medical Rec #:  [id number]     Height:       62.0 in Accession #:    [id number]    Weight:       135.0 lb Date of Birth:  2001/04/04     BSA:          1.618 m Patient Age:    20 years      BP:           104/84 mmHg Patient Gender: F             HR:           110 bpm. Exam Location:  Inpatient Procedure: 2D Echo, Cardiac Doppler and Color Doppler Indications:    Fever R50.9  History:        Patient has no prior history of Echocardiogram examinations.  Sonographer:    Jonelle Sidle Dance Referring Phys: [id number] Tamecia Mcdougald N VAN DAM  Sonographer Comments: Reading  Cardiologist notified. IMPRESSIONS  1. Left ventricular ejection fraction, by estimation, is 60 to 65%. The left ventricle has normal function. The left ventricle has no regional wall motion abnormalities. Left ventricular diastolic parameters were normal.  2. Right ventricular systolic function is normal. The right ventricular size is normal.  3. The mitral valve is normal in structure. No evidence of mitral valve regurgitation. No evidence of mitral stenosis.  4. Possible vegetation on tricuspid valve, 1.2 x 0.4cm mobile, septal leaflet. Consider TEE for further clarification. The tricuspid valve is abnormal.  5. The aortic valve is normal in structure. Aortic valve regurgitation is not visualized. No aortic stenosis is present.  6. The inferior vena cava is normal in size with greater than 50% respiratory variability, suggesting right atrial pressure of 3 mmHg. FINDINGS  Left Ventricle: Left ventricular ejection fraction, by estimation, is 60 to 65%. The left ventricle has normal function. The left ventricle has no regional wall motion abnormalities. The left ventricular internal cavity size was normal in size. There is  no left ventricular hypertrophy. Left ventricular diastolic parameters were normal. Right Ventricle: The right ventricular size is normal. No increase in right ventricular wall thickness. Right ventricular systolic function is normal. Left Atrium: Left atrial size was normal in size. Right Atrium: Right atrial size was normal in size. Pericardium: There  is no evidence of pericardial effusion. Mitral Valve: The mitral valve is normal in structure. No evidence of mitral valve regurgitation. No evidence of mitral valve stenosis. Tricuspid Valve: Possible vegetation on tricuspid valve, 1.2 x 0.4cm mobile, septal leaflet. Consider TEE for further clarification. The tricuspid valve is abnormal. Tricuspid valve regurgitation is not demonstrated. No evidence of tricuspid stenosis. Aortic Valve: The  aortic valve is normal in structure. Aortic valve regurgitation is not visualized. No aortic stenosis is present. Pulmonic Valve: The pulmonic valve was normal in structure. Pulmonic valve regurgitation is not visualized. No evidence of pulmonic stenosis. Aorta: The aortic root is normal in size and structure. Venous: The inferior vena cava is normal in size with greater than 50% respiratory variability, suggesting right atrial pressure of 3 mmHg. IAS/Shunts: No atrial level shunt detected by color flow Doppler.  LEFT VENTRICLE PLAX 2D LVIDd:         3.60 cm  Diastology LVIDs:         2.70 cm  LV e' medial:    12.00 cm/s LV PW:         1.00 cm  LV E/e' medial:  8.8 LV IVS:        0.70 cm  LV e' lateral:   19.10 cm/s LVOT diam:     1.70 cm  LV E/e' lateral: 5.5 LV SV:         35 LV SV Index:   22 LVOT Area:     2.27 cm  RIGHT VENTRICLE             IVC RV Basal diam:  2.20 cm     IVC diam: 1.80 cm RV S prime:     12.30 cm/s TAPSE (M-mode): 2.5 cm LEFT ATRIUM             Index       RIGHT ATRIUM          Index LA diam:        3.10 cm 1.92 cm/m  RA Area:     8.27 cm LA Vol (A2C):   38.9 ml 24.05 ml/m RA Volume:   15.70 ml 9.71 ml/m LA Vol (A4C):   22.7 ml 14.03 ml/m LA Biplane Vol: 29.6 ml 18.30 ml/m  AORTIC VALVE LVOT Vmax:   109.00 cm/s LVOT Vmean:  66.600 cm/s LVOT VTI:    0.154 m  AORTA Ao Root diam: 2.40 cm Ao Asc diam:  2.40 cm MITRAL VALVE MV Area (PHT): 3.85 cm     SHUNTS MV Decel Time: 197 msec     Systemic VTI:  0.15 m MV E velocity: 106.00 cm/s  Systemic Diam: 1.70 cm MV A velocity: 76.40 cm/s MV E/A ratio:  1.39 Candee Furbish MD Electronically signed by Candee Furbish MD Signature Date/Time: 01/10/2021/10:32:01 AM    Final    US Abdomen Limited RUQ (LIVER/GB)  Result Date: 01/10/2021 CLINICAL DATA:  Elevated liver enzymes with epigastric region pain EXAM: ULTRASOUND ABDOMEN LIMITED RIGHT UPPER QUADRANT COMPARISON:  CT abdomen and pelvis January 07, 2021 FINDINGS: Gallbladder: No evident gallstones.  Gallbladder wall appears thickened. No pericholecystic fluid no sonographic Murphy sign noted by sonographer. Common bile duct: Diameter: 5 mm. No intrahepatic or extrahepatic biliary duct dilatation. Liver: No focal lesion identified. Within normal limits in parenchymal echogenicity. Portal vein is patent on color Doppler imaging with normal direction of blood flow towards the liver. Other: None. IMPRESSION: Thickening of the gallbladder wall noted. No gallstones evident. Question a degree of  acalculus cholecystitis. This finding may warrant nuclear medicine hepatobiliary imaging study to assess cystic duct patency. Study otherwise unremarkable. Electronically Signed   By: Lowella Grip III M.D.   On: 01/10/2021 14:10      Assessment/Plan:  INTERVAL HISTORY: Fever curve improved rash has worsened TTE with possible vegetation on tricuspid valve   Principal Problem:   Intractable nausea and vomiting Active Problems:   Intractable pain   Pelvic pain   Fever of unknown origin   Splenomegaly    Alexandra Young is a 20 y.o. female with admission with abdominal pain pelvic pain and high fevers now with a rash.  The scan had shown subtle splenomegaly and some retroperitoneal lymph nodes.  She also had some mild transaminitis on her liver function test.  2D echocardiogram shows possible vegetation on tricuspid valve. Lfts trending up still as well  #1 Fever unknown origin:  I actually would favor a viral infection with exanthem  She did also recently have her period but does not use tampons and not left pads in place for prolonged time in way that would put her at risk for Toxic shock syndrome and other than fever and rash she does not have low blood pressure  FUO labs have been fairly unremarkable so far including acute hepatitis panel fourth-generation HIV antibody.  She has evidence of prior CMV and EBV but not active infection with either of these viruses.  RPR is negative histoplasma  Blastomyces and cryptococcal antigens are negative.  Blood cultures have been sterile  Sed rate slightly elevated, rheumatoid factor negative.  HIV RNA still pending.  Parvovirus antibodies are pending.  As mentioned she did have a possible vegetation of the fat of the tricuspid valve.  Dr. Marin Olp is contemplating bone marrow biopsy.  I would not favor doing that at this point in time.  I would be in favor of getting a transesophageal echocardiogram to make sure she does not have endocarditis.  That acute titers for RMSF and Ehrlichia but I do not think she has either of these conditions.  Her rash could be 1 seen with RMSF but the rest of her labs and clinical picture did not fit with it.   I spent greater than 35  minutes with the patient including greater than 50% of time in face to face counsel of the patient guarding her FUO work-up and in coordination of her care with Dr. Lonny Prude Dr. Marin Olp and Dr. Marlou Porch.    LOS: 1 day   Alcide Evener 01/10/2021, 4:21 PM

## 2021-01-10 NOTE — Progress Notes (Signed)
Pt placed on enteric to r/o norovirus

## 2021-01-10 NOTE — Progress Notes (Signed)
Looks like we can have to do a bone marrow biopsy on Ms. [place].  She still having some temperatures.  Her CT scan of the chest showed some small axillary lymph nodes in the left axilla.  There is no hilar or mediastinal lymph nodes.  The ultrasound of her spleen showed a prominent spleen but no mention of splenomegaly.  She was seen by infectious disease.  So far, cultures were all negative.  She now has a rash this morning.  This is somewhat pruritic.  Again, looks like this is some kind of viral syndrome that she has.  I just am not sure what kind of hematologic malignancy we could be looking at.  I looked at her blood smear.  She did have some reactive white blood cells.  Also some monocytes.  Her labs today show white cell count of 10.3.  Hemoglobin 12.2.  Platelet count 173,000.  She has 27 neutrophils 57% lymphs.  Again, I have to believe that this is some kind of viral syndrome that we are dealing with.  Her exam is pretty much unremarkable.  We will see about having a bone marrow biopsy set up for her.  We will try to have this done tomorrow.  Of note, yesterday, her LFTs were little bit elevated.  Again this could be reflective of a viral syndrome.  The bone marrow will clearly show [place] if there is any lymphoproliferative process.  Lattie Haw, MD  Proverbs 17:17

## 2021-01-10 NOTE — Progress Notes (Signed)
PROGRESS NOTE    Alexandra Young  VFI:[id number] DOB: December 07, 2000 DOA: 01/07/2021 PCP: Lynetta Mare, NP   Brief Narrative: Alexandra Young is a 20 y.o. female with a history of IBS and anxiety. She presented secondary to left flank pain. She was found to have significant fevers and non-specific findings on CT scan including a prominent spleen and retroperitoneal lymph nodes.   Assessment & Plan:   Principal Problem:   Intractable nausea and vomiting Active Problems:   Intractable pain   Pelvic pain   Fever of unknown origin   Splenomegaly   Intractable nausea/vomiting Unknown etiology. No emesis this morning -Continue Zofran prn  Intractable pain Left pelvic pain Unsure of etiology. Imaging significant for left ovarian cyst. Presentation discussed with Gynecology on admission with no recommendation for continued Gynecology follow-up.  -Continue oxycodone/dilaudid prn  Fever of unknown origin Patient with continued fevers. Cryptococcal antigen, HIV, RPR negative. Procalcitonin low. No associated leukocytosis. EBV and CMV IgG positive with negative IgM. Transthoracic Echocardiogram significant for abnormal TV with concern for possible vegetation. Blood cultures no growth to date -Infectious disease recommendations: HIV, RPR, RMSF IgG/IgM, Parvovirus C16 IgG/IgM, Ehrlichia ab panel, ANA -Transesophageal Echocardiogram  Rash Macular rash. Confluent. Unsure if related to viral illness vs drug eruption. -Benadryl prn  Prominent spleen Retroperitoneal lymphadenopathy Prominent per radiologist read. 12.7 cm in AP view. Family history of AML in her maternal aunt. Possibly related to viral illness. CT chest without worrisome lymphadenopathy. Consideration of bone marrow biopsy per hematology/oncology but will defer for now. Protein electrophoresis is pending.  IBS Stable  Anxiety Stable -Continue Buspar and Lamictal  Allergic rhinitis -Continue Singulair and  Claritin   DVT prophylaxis: Lovenox Code Status:   Code Status: Full Code Family Communication: Mother on telephone Disposition Plan: Discharge likely 2+ days pending continued fever workup   Consultants:   Hematology/oncology  Infectious disease  Procedures:   TRANSTHORACIC ECHOCARDIOGRAM (01/10/2021) IMPRESSIONS    1. Left ventricular ejection fraction, by estimation, is 60 to 65%. The  left ventricle has normal function. The left ventricle has no regional  wall motion abnormalities. Left ventricular diastolic parameters were  normal.  2. Right ventricular systolic function is normal. The right ventricular  size is normal.  3. The mitral valve is normal in structure. No evidence of mitral valve  regurgitation. No evidence of mitral stenosis.  4. Possible vegetation on tricuspid valve, 1.2 x 0.4cm mobile, septal  leaflet. Consider TEE for further clarification. The tricuspid valve is  abnormal.  5. The aortic valve is normal in structure. Aortic valve regurgitation is  not visualized. No aortic stenosis is present.  6. The inferior vena cava is normal in size with greater than 50%  respiratory variability, suggesting right atrial pressure of 3 mmHg.  Antimicrobials:  None    Subjective: Abdominal pain is worse. Feels badly overall. No vomiting. Some nausea. Rash is still itchy.  Objective: Vitals:   01/10/21 0548 01/10/21 0952 01/10/21 1020 01/10/21 1236  BP: (!) 104/54 116/78  117/78  Pulse: (!) 105 (!) 117 (!) 116 (!) 103  Resp: $Remo'16 16  16  'ywwiY$ Temp: 98.7 F (37.1 C) (!) 100.9 F (38.3 C) (!) 101 F (38.3 C) 98 F (36.7 C)  TempSrc: Oral Oral Oral Oral  SpO2: 99% 98% 98% 100%  Weight:      Height:        Intake/Output Summary (Last 24 hours) at 01/10/2021 1303 Last data filed at 01/10/2021 1000 Gross per 24 hour  Intake  3194.41 ml  Output --  Net 3194.41 ml   Filed Weights   01/07/21 0600  Weight: 61.2 kg    Examination:  General exam: Appears  calm and uncomfortable and in no acute distress but in pain. Conversant Respiratory: Clear to auscultation. Respiratory effort normal with no intercostal retractions or use of accessory muscles Cardiovascular: S1 & S2 heard, RRR. No murmurs, rubs, gallops or clicks. No edema Gastrointestinal: Abdomen is nondistended, soft and moderately tender in epigastric area. No masses felt. Normal bowel sounds heard Neurologic: No focal neurological deficits Musculoskeletal: No calf tenderness Skin: No cyanosis. Maculopapular rash of trunk down to abdomen and pelvis. Bilateral legs with a papular rash from knees moving distally Psychiatry: Alert and oriented. Memory intact. Mood & affect appropriate   Data Reviewed: I have personally reviewed following labs and imaging studies  CBC Lab Results  Component Value Date   WBC 10.3 01/10/2021   RBC 4.40 01/10/2021   HGB 12.2 01/10/2021   HCT 37.0 01/10/2021   MCV 84.1 01/10/2021   MCH 27.7 01/10/2021   PLT 173 01/10/2021   MCHC 33.0 01/10/2021   RDW 13.3 01/10/2021   LYMPHSABS 6.0 (H) 01/10/2021   MONOABS 0.5 01/10/2021   EOSABS 0.8 (H) 01/10/2021   BASOSABS 0.2 (H) 91/47/8295     Last metabolic panel Lab Results  Component Value Date   NA 135 01/10/2021   K 3.6 01/10/2021   CL 110 01/10/2021   CO2 16 (L) 01/10/2021   BUN <5 (L) 01/10/2021   CREATININE 0.56 01/10/2021   GLUCOSE 120 (H) 01/10/2021   GFRNONAA >60 01/10/2021   CALCIUM 8.3 (L) 01/10/2021   PROT 6.7 01/10/2021   ALBUMIN 3.4 (L) 01/10/2021   BILITOT 0.6 01/10/2021   ALKPHOS 131 (H) 01/10/2021   AST 73 (H) 01/10/2021   ALT 89 (H) 01/10/2021   ANIONGAP 9 01/10/2021    CBG (last 3)  No results for input(s): GLUCAP in the last 72 hours.   GFR: Estimated Creatinine Clearance: 95.7 mL/min (by C-G formula based on SCr of 0.56 mg/dL).  Coagulation Profile: No results for input(s): INR, PROTIME in the last 168 hours.  Recent Results (from the past 240 hour(s))  Resp  Panel by RT-PCR (Flu A&B, Covid) Nasopharyngeal Swab     Status: None   Collection Time: 01/04/21  4:24 PM   Specimen: Nasopharyngeal Swab; Nasopharyngeal(NP) swabs in vial transport medium  Result Value Ref Range Status   SARS Coronavirus 2 by RT PCR NEGATIVE NEGATIVE Final    Comment: (NOTE) SARS-CoV-2 target nucleic acids are NOT DETECTED.  The SARS-CoV-2 RNA is generally detectable in upper respiratory specimens during the acute phase of infection. The lowest concentration of SARS-CoV-2 viral copies this assay can detect is 138 copies/mL. A negative result does not preclude SARS-Cov-2 infection and should not be used as the sole basis for treatment or other patient management decisions. A negative result may occur with  improper specimen collection/handling, submission of specimen other than nasopharyngeal swab, presence of viral mutation(s) within the areas targeted by this assay, and inadequate number of viral copies(<138 copies/mL). A negative result must be combined with clinical observations, patient history, and epidemiological information. The expected result is Negative.  Fact Sheet for Patients:  EntrepreneurPulse.com.au  Fact Sheet for Healthcare Providers:  IncredibleEmployment.be  This test is no t yet approved or cleared by the [place] FDA and  has been authorized for detection and/or diagnosis of SARS-CoV-2 by FDA under an Emergency Use Authorization (EUA).  This EUA will remain  in effect (meaning this test can be used) for the duration of the COVID-19 declaration under Section 564(b)(1) of the Act, 21 U.S.C.section 360bbb-3(b)(1), unless the authorization is terminated  or revoked sooner.       Influenza A by PCR NEGATIVE NEGATIVE Final   Influenza B by PCR NEGATIVE NEGATIVE Final    Comment: (NOTE) The Xpert Xpress SARS-CoV-2/FLU/RSV plus assay is intended as an aid in the diagnosis of influenza from Nasopharyngeal  swab specimens and should not be used as a sole basis for treatment. Nasal washings and aspirates are unacceptable for Xpert Xpress SARS-CoV-2/FLU/RSV testing.  Fact Sheet for Patients: EntrepreneurPulse.com.au  Fact Sheet for Healthcare Providers: IncredibleEmployment.be  This test is not yet approved or cleared by the [place] FDA and has been authorized for detection and/or diagnosis of SARS-CoV-2 by FDA under an Emergency Use Authorization (EUA). This EUA will remain in effect (meaning this test can be used) for the duration of the COVID-19 declaration under Section 564(b)(1) of the Act, 21 U.S.C. section 360bbb-3(b)(1), unless the authorization is terminated or revoked.  Performed at [facility], [place] [address] [place], [place] [address]   Wet prep, genital     Status: Abnormal   Collection Time: 01/04/21  7:09 PM   Specimen: PATH Cytology Cervicovaginal Ancillary Only  Result Value Ref Range Status   Yeast Wet Prep HPF POC NONE SEEN NONE SEEN Final   Trich, Wet Prep NONE SEEN NONE SEEN Final   Clue Cells Wet Prep HPF POC PRESENT (A) NONE SEEN Final   WBC, Wet Prep HPF POC MANY (A) NONE SEEN Final   Sperm NONE SEEN  Final    Comment: Performed at [facility], [place] [address], [place], [place] [address]  Urine culture     Status: Abnormal   Collection Time: 01/07/21  9:32 AM   Specimen: Urine, Catheterized  Result Value Ref Range Status   Specimen Description   Final    URINE, CATHETERIZED Performed at [place] [address], [place], [place] [address]    Special Requests   Final    NONE Performed at [facility], [place] [address] [place], [place] [address]    Culture (A)  Final    <10,000 COLONIES/mL INSIGNIFICANT GROWTH Performed at [place] [address] [place], [place] [address]    Report Status 01/08/2021 FINAL  Final   Culture, blood (routine x 2)     Status: None (Preliminary result)   Collection Time: 01/07/21  1:39 PM   Specimen: BLOOD RIGHT HAND  Result Value Ref Range Status   Specimen Description   Final    BLOOD RIGHT HAND Performed at [place] [address], [place], [place] [address]    Special Requests   Final    BOTTLES DRAWN AEROBIC ONLY Blood Culture adequate volume Performed at [place] [address], [place], [place] [address]    Culture   Final    NO GROWTH 3 DAYS Performed at [facility], [place] [address], [place], [place] [address]    Report Status PENDING  Incomplete  Culture, blood (routine x 2)     Status: None (Preliminary result)   Collection Time: 01/07/21  1:39 PM   Specimen: BLOOD LEFT HAND  Result Value Ref Range Status   Specimen Description   Final    BLOOD LEFT HAND Performed at [facility], [place] [address], [place], [place] [address]    Special Requests   Final    BOTTLES DRAWN AEROBIC ONLY Blood Culture results may not be optimal due to an inadequate volume of blood received in culture bottles Performed at [place] [address], [place], [place] [address]    Culture   Final    NO GROWTH 3 DAYS Performed at [facility], [place] [address] [place], [place] [address]    Report Status PENDING  Incomplete        Radiology Studies: CT CHEST W CONTRAST  Result Date: 01/09/2021 CLINICAL DATA:  Fever of unknown origin, prominent spleen EXAM: CT CHEST WITH CONTRAST TECHNIQUE: Multidetector CT imaging of the chest was performed during intravenous contrast administration. CONTRAST:  10mL OMNIPAQUE IOHEXOL 300 MG/ML  SOLN COMPARISON:  None. FINDINGS: Cardiovascular: Heart is normal in size.  No pericardial effusion. No evidence of thoracic aortic aneurysm. Mediastinum/Nodes: No suspicious mediastinal lymphadenopathy. Small bilateral axillary nodes measuring up to 8 mm  short axis on the left (series 2/image 26), likely reactive. Triangular soft tissue in the anterior mediastinum (series 2/image 49) reflects residual thymus. Visualized thyroid is unremarkable. Lungs/Pleura: Lungs are clear. No suspicious pulmonary nodules. No focal consolidation. Trace right pleural effusion (series 2/image 110). No pneumothorax. Upper Abdomen: Visualized upper abdomen is unchanged from recent CT abdomen/pelvis, noting a prominent spleen. Musculoskeletal: Visualized osseous structures are within normal limits. IMPRESSION: Trace right pleural effusion. Otherwise negative CT chest. Electronically Signed   By: Julian Hy M.D.   On: 01/09/2021 10:39   US Abdomen Limited  Result Date: 01/09/2021 CLINICAL DATA:  Left upper quadrant pain EXAM: ULTRASOUND SPLEEN COMPARISON:  None. FINDINGS: Spleen measures 13.3 x 8.1 x 11.9 cm with a measures splenic volume of 670 cubic cm. No focal splenic lesions are evident. No perisplenic fluid or adenopathy. IMPRESSION: Prominent spleen without focal splenic lesion evident. Electronically Signed   By: Lowella Grip III M.D.   On: 01/09/2021 07:56   ECHOCARDIOGRAM COMPLETE  Result Date: 01/10/2021    ECHOCARDIOGRAM REPORT   Patient Name:   [facility] Date of Exam: 01/10/2021 Medical Rec #:  [id number]     Height:       62.0 in Accession #:    [id number]    Weight:       135.0 lb Date of Birth:  July 11, 2001     BSA:          1.618 m Patient Age:    20 years      BP:           104/84 mmHg Patient Gender: F             HR:           110 bpm. Exam Location:  Inpatient Procedure: 2D Echo, Cardiac Doppler and Color Doppler Indications:    Fever R50.9  History:        Patient has no prior history of Echocardiogram examinations.  Sonographer:    Jonelle Sidle Dance Referring Phys: [id number] CORNELIUS N VAN DAM  Sonographer Comments: Reading Cardiologist notified. IMPRESSIONS  1. Left ventricular ejection fraction, by estimation, is 60 to 65%. The left ventricle has normal  function. The left ventricle has no regional wall motion abnormalities. Left ventricular diastolic parameters were normal.  2. Right ventricular systolic function is normal. The right ventricular size is normal.  3. The mitral valve is normal in structure. No evidence of mitral valve regurgitation. No  evidence of mitral stenosis.  4. Possible vegetation on tricuspid valve, 1.2 x 0.4cm mobile, septal leaflet. Consider TEE for further clarification. The tricuspid valve is abnormal.  5. The aortic valve is normal in structure. Aortic valve regurgitation is not visualized. No aortic stenosis is present.  6. The inferior vena cava is normal in size with greater than 50% respiratory variability, suggesting right atrial pressure of 3 mmHg. FINDINGS  Left Ventricle: Left ventricular ejection fraction, by estimation, is 60 to 65%. The left ventricle has normal function. The left ventricle has no regional wall motion abnormalities. The left ventricular internal cavity size was normal in size. There is  no left ventricular hypertrophy. Left ventricular diastolic parameters were normal. Right Ventricle: The right ventricular size is normal. No increase in right ventricular wall thickness. Right ventricular systolic function is normal. Left Atrium: Left atrial size was normal in size. Right Atrium: Right atrial size was normal in size. Pericardium: There is no evidence of pericardial effusion. Mitral Valve: The mitral valve is normal in structure. No evidence of mitral valve regurgitation. No evidence of mitral valve stenosis. Tricuspid Valve: Possible vegetation on tricuspid valve, 1.2 x 0.4cm mobile, septal leaflet. Consider TEE for further clarification. The tricuspid valve is abnormal. Tricuspid valve regurgitation is not demonstrated. No evidence of tricuspid stenosis. Aortic Valve: The aortic valve is normal in structure. Aortic valve regurgitation is not visualized. No aortic stenosis is present. Pulmonic Valve: The  pulmonic valve was normal in structure. Pulmonic valve regurgitation is not visualized. No evidence of pulmonic stenosis. Aorta: The aortic root is normal in size and structure. Venous: The inferior vena cava is normal in size with greater than 50% respiratory variability, suggesting right atrial pressure of 3 mmHg. IAS/Shunts: No atrial level shunt detected by color flow Doppler.  LEFT VENTRICLE PLAX 2D LVIDd:         3.60 cm  Diastology LVIDs:         2.70 cm  LV e' medial:    12.00 cm/s LV PW:         1.00 cm  LV E/e' medial:  8.8 LV IVS:        0.70 cm  LV e' lateral:   19.10 cm/s LVOT diam:     1.70 cm  LV E/e' lateral: 5.5 LV SV:         35 LV SV Index:   22 LVOT Area:     2.27 cm  RIGHT VENTRICLE             IVC RV Basal diam:  2.20 cm     IVC diam: 1.80 cm RV S prime:     12.30 cm/s TAPSE (M-mode): 2.5 cm LEFT ATRIUM             Index       RIGHT ATRIUM          Index LA diam:        3.10 cm 1.92 cm/m  RA Area:     8.27 cm LA Vol (A2C):   38.9 ml 24.05 ml/m RA Volume:   15.70 ml 9.71 ml/m LA Vol (A4C):   22.7 ml 14.03 ml/m LA Biplane Vol: 29.6 ml 18.30 ml/m  AORTIC VALVE LVOT Vmax:   109.00 cm/s LVOT Vmean:  66.600 cm/s LVOT VTI:    0.154 m  AORTA Ao Root diam: 2.40 cm Ao Asc diam:  2.40 cm MITRAL VALVE MV Area (PHT): 3.85 cm     SHUNTS MV Decel Time:  197 msec     Systemic VTI:  0.15 m MV E velocity: 106.00 cm/s  Systemic Diam: 1.70 cm MV A velocity: 76.40 cm/s MV E/A ratio:  1.39 Candee Furbish MD Electronically signed by Candee Furbish MD Signature Date/Time: 01/10/2021/10:32:01 AM    Final         Scheduled Meds: . busPIRone  10 mg Oral BID  . enoxaparin (LOVENOX) injection  40 mg Subcutaneous Q24H  . lamoTRIgine  50 mg Oral Daily  . loratadine  10 mg Oral Daily  . montelukast  10 mg Oral QHS  . ondansetron (ZOFRAN) IV  4 mg Intravenous Q8H   Or  . ondansetron  4 mg Oral Q8H   Continuous Infusions: . sodium chloride 75 mL/hr at 01/10/21 0859     LOS: 1 day     Cordelia Poche,  MD [facility] 01/10/2021, 1:03 PM  If 7PM-7AM, please contact night-coverage [email or web address]

## 2021-01-10 NOTE — Plan of Care (Signed)
  Problem: Clinical Measurements: Goal: Diagnostic test results will improve Outcome: Progressing   Problem: Clinical Measurements: Goal: Respiratory complications will improve Outcome: Progressing   Problem: Clinical Measurements: Goal: Cardiovascular complication will be avoided Outcome: Progressing   Problem: Elimination: Goal: Will not experience complications related to bowel motility Outcome: Progressing   Problem: Skin Integrity: Goal: Risk for impaired skin integrity will decrease Outcome: Progressing   

## 2021-01-10 NOTE — Progress Notes (Signed)
  Echocardiogram 2D Echocardiogram has been performed.  Alexandra Young 01/10/2021, 8:52 AM

## 2021-01-10 NOTE — Plan of Care (Signed)
Plan of care discussed with pt.

## 2021-01-10 NOTE — Progress Notes (Addendum)
Provider Mansy notified of need for additional pain medication. Pt with pain/tightness in her upper abdomen while trying to rest. Rn also concerned for norovirus (pt with loose green bowel movements) and notified md. Pt placed on enteric precautions.

## 2021-01-10 NOTE — Progress Notes (Signed)
Pt with temp of 102.9 hr 117. Mews red. No other change in pts condition. Pt alert and oriented, no new complaints. Notified charge nurse Dahlia Client as well as Public librarian. Tylenol has been given and ice packs applied, no other suggestions from rapid nurse. Dr Caleb Popp also notified, no new orders from him at this time. Report given to third shift nurse and she is aware of red mews. Iv to lt hand infiltrated, IV team consulted for new iv.

## 2021-01-11 ENCOUNTER — Inpatient Hospital Stay (HOSPITAL_COMMUNITY): Payer: Medicaid Other

## 2021-01-11 DIAGNOSIS — R52 Pain, unspecified: Secondary | ICD-10-CM

## 2021-01-11 DIAGNOSIS — R509 Fever, unspecified: Secondary | ICD-10-CM | POA: Diagnosis not present

## 2021-01-11 DIAGNOSIS — R7989 Other specified abnormal findings of blood chemistry: Secondary | ICD-10-CM | POA: Diagnosis not present

## 2021-01-11 DIAGNOSIS — R1013 Epigastric pain: Secondary | ICD-10-CM | POA: Diagnosis not present

## 2021-01-11 DIAGNOSIS — I079 Rheumatic tricuspid valve disease, unspecified: Secondary | ICD-10-CM | POA: Diagnosis not present

## 2021-01-11 LAB — COMPREHENSIVE METABOLIC PANEL
ALT: 142 U/L — ABNORMAL HIGH (ref 0–44)
AST: 124 U/L — ABNORMAL HIGH (ref 15–41)
Albumin: 3.1 g/dL — ABNORMAL LOW (ref 3.5–5.0)
Alkaline Phosphatase: 108 U/L (ref 38–126)
Anion gap: 11 (ref 5–15)
BUN: <5 mg/dL — ABNORMAL LOW (ref 6–20)
CO2: 20 mmol/L — ABNORMAL LOW (ref 22–32)
Calcium: 8 mg/dL — ABNORMAL LOW (ref 8.9–10.3)
Chloride: 108 mmol/L (ref 98–111)
Creatinine, Ser: 0.42 mg/dL — ABNORMAL LOW (ref 0.44–1.00)
GFR, Estimated: >60 mL/min (ref >60)
Glucose, Bld: 119 mg/dL — ABNORMAL HIGH (ref 70–99)
Potassium: 4 mmol/L (ref 3.5–5.1)
Sodium: 139 mmol/L (ref 135–145)
Total Bilirubin: 1.2 mg/dL (ref 0.3–1.2)
Total Protein: 6 g/dL — ABNORMAL LOW (ref 6.5–8.1)

## 2021-01-11 LAB — LIPASE, BLOOD: Lipase: 82 U/L — ABNORMAL HIGH (ref 11–51)

## 2021-01-11 LAB — BLASTOMYCES ANTIGEN: Blastomyces Antigen: NOT DETECTED ng/mL

## 2021-01-11 LAB — PROTEIN ELECTROPHORESIS, SERUM
A/G Ratio: 1.1 (ref 0.7–1.7)
Albumin ELP: 3.3 g/dL (ref 2.9–4.4)
Alpha-1-Globulin: 0.4 g/dL (ref 0.0–0.4)
Alpha-2-Globulin: 1 g/dL (ref 0.4–1.0)
Beta Globulin: 0.9 g/dL (ref 0.7–1.3)
Gamma Globulin: 0.8 g/dL (ref 0.4–1.8)
Globulin, Total: 3.1 g/dL (ref 2.2–3.9)
Total Protein ELP: 6.4 g/dL (ref 6.0–8.5)

## 2021-01-11 LAB — HGB FRACTIONATION CASCADE
Hgb A2: 2.5 % (ref 1.8–3.2)
Hgb A: 97.5 % (ref 96.4–98.8)
Hgb F: 0 % (ref 0.0–2.0)
Hgb S: 0 %

## 2021-01-11 LAB — HISTONE ANTIBODIES, IGG, BLOOD: DNA-Histone: 0.3 Units (ref 0.0–0.9)

## 2021-01-11 LAB — CBC
HCT: 34.5 % — ABNORMAL LOW (ref 36.0–46.0)
Hemoglobin: 11.7 g/dL — ABNORMAL LOW (ref 12.0–15.0)
MCH: 28.5 pg (ref 26.0–34.0)
MCHC: 33.9 g/dL (ref 30.0–36.0)
MCV: 84.1 fL (ref 80.0–100.0)
Platelets: 154 10*3/uL (ref 150–400)
RBC: 4.1 MIL/uL (ref 3.87–5.11)
RDW: 13.6 % (ref 11.5–15.5)
WBC: 12.6 10*3/uL — ABNORMAL HIGH (ref 4.0–10.5)
nRBC: 0 % (ref 0.0–0.2)

## 2021-01-11 LAB — ROCKY MTN SPOTTED FVR ABS PNL(IGG+IGM)
RMSF IgG: NEGATIVE
RMSF IgM: 0.47 index (ref 0.00–0.89)

## 2021-01-11 MED ORDER — KETOROLAC TROMETHAMINE 30 MG/ML IJ SOLN
30.0000 mg | Freq: Four times a day (QID) | INTRAMUSCULAR | Status: DC | PRN
  Administered 2021-01-11 – 2021-01-12 (×2): 30 mg via INTRAVENOUS
  Filled 2021-01-11 (×2): qty 1

## 2021-01-11 MED ORDER — ACETAMINOPHEN 500 MG PO TABS
1000.0000 mg | ORAL_TABLET | Freq: Once | ORAL | Status: AC
  Administered 2021-01-11: 1000 mg via ORAL
  Filled 2021-01-11: qty 2

## 2021-01-11 MED ORDER — TRIAMCINOLONE ACETONIDE 0.5 % EX CREA
TOPICAL_CREAM | Freq: Two times a day (BID) | CUTANEOUS | Status: DC
  Filled 2021-01-11 (×3): qty 15

## 2021-01-11 MED ORDER — TECHNETIUM TC 99M MEBROFENIN IV KIT
5.3600 | PACK | Freq: Once | INTRAVENOUS | Status: AC | PRN
  Administered 2021-01-11: 5.36 via INTRAVENOUS

## 2021-01-11 MED ORDER — IOHEXOL 300 MG/ML  SOLN
75.0000 mL | Freq: Once | INTRAMUSCULAR | Status: AC | PRN
  Administered 2021-01-11: 75 mL via INTRAVENOUS

## 2021-01-11 MED ORDER — TRIAMCINOLONE ACETONIDE 0.1 % EX CREA
TOPICAL_CREAM | Freq: Two times a day (BID) | CUTANEOUS | Status: DC
  Filled 2021-01-11: qty 15

## 2021-01-11 MED ORDER — IOHEXOL 9 MG/ML PO SOLN
500.0000 mL | ORAL | Status: AC
  Administered 2021-01-11: 500 mL via ORAL

## 2021-01-11 MED ORDER — SODIUM CHLORIDE 0.9 % IV SOLN: INTRAVENOUS | Status: DC

## 2021-01-11 NOTE — Progress Notes (Signed)
Pt resting well in bed with even and unlabored respirations at 18 respirations per minute. Pt did have n/v after taking Tylenol though the medication stayed down. RN will continue to monitor. Pt continues to be emotional while in pain while awake with periods of crying and guarding abdomen. Heat applied to abdomen and pt did rest well after this application. Pt loose stools have also slowed down with no bowel movements since md was called earlier about norovirus concern. RN will continue to monitor.

## 2021-01-11 NOTE — Progress Notes (Signed)
Notified Dr Caleb Popp that pt is complaining of vaginal itching and feeling like she has a yeast infection. Pt is on her menstrual cycle so unable to tell if she has any type of discharge.

## 2021-01-11 NOTE — Progress Notes (Signed)
Pt in yellow mews, temp 101 hr 117. Dr Caleb Popp is aware as well as charge nurse. No other changes in pts condition. Pt has been medicated with tylenol, has been running intermittent temperatures, Drs are aware and tests are being done to determine cause.

## 2021-01-11 NOTE — Progress Notes (Signed)
Notified Dr Caleb Popp that pts rash is getting worse. More raised and prominent than earlier in day. He is going to review medications and make changes as needed.

## 2021-01-11 NOTE — Progress Notes (Signed)
Rn notified pt that she will not be able to take narcotic containing products after midnight as she is is scheduled for a hida scan tomorrow morning. Rn will call Nuclear med when they open to obtain time for procedure. Pt/ pt family as asked about what time TEE might be as well. Rn will unable to locate a TEE time for this patient. Rn will continue to monitor.

## 2021-01-11 NOTE — Progress Notes (Signed)
PROGRESS NOTE    Alexandra Young  WVP:[id number] DOB: 2001/06/19 DOA: 01/07/2021 PCP: Aretha Parrot, NP    Chief Complaint  Patient presents with  . Pelvic Pain  . Fever    Brief Narrative:  Alexandra Young is Alexandra Young 20 y.o. female with Alexandra Young history of IBS and anxiety. She presented secondary to left flank pain. She was found to have significant fevers and non-specific findings on CT scan including Alexandra Young prominent spleen and retroperitoneal lymph nodes.  She's continued to have intermittent fevers and abdominal pain with unclear etiology.   Assessment & Plan:   Principal Problem:   Fever of unknown origin Active Problems:   Intractable nausea and vomiting   Intractable pain   Pelvic pain   Splenomegaly   Elevated LFTs   Epigastric pain   Left flank pain   Rash   Endocarditis of tricuspid valve   Transaminitis  Concern for DRESS?  Diffuse Maculopapular Rash Possible unifying diagnosis Started on lamictal about 1.5 months ago during psychiatric hospitalization Lamictal is high risk drug for dress Absolute eosinophils 0.8 LFT's rising Consider punch biopsy Stop lamictal, follow  Fever of unknown origin Unrevealing workup so far HIDA scan today suggestive of hepatocellular dysfunction, hepatitis -> equivocal study in evaluation for chronic cholecystitis/biliary dyskinesia RUQ [place] with gallbladder thickenign CT chest with trace R effusion CT abdomen/pelvis without acute abnormality Prominent retroperitoneal LN along L side of aorta Pelvic US with 1.8 cm L ovarian dominant follicle, no evidence torsion Parvovirus B19 IgG positive Ehrlichia ab panel negative RMSF studies pending ANA negative RF negative RPR negative EBV ab panel suggestive of past infection CMV IgG positive, negative IgM Acute hepatitis panel negative Negative HIV RNA quant Histoplasma antigen pending collection Echo with possible vegetation on tricuspid valve -> follow TEE Repeat CT abdomen pelvis with  continued pain today  Abdominal Pain Follow repeat CT scan today Follow repeat lipase Pain management  Prominent Spleen Retroperitoneal Lymphadenopathy Heme onc was considering bone marrow bx, with alternative diagnoses above, I think reasonable to continue to hold off on this   Anxiety  Depression Holding lamictal with above Buspar Consider psych c/s to discuss replacing med  IBS   Allergies Singulair, claritin  DVT prophylaxis: lovenox Code Status: full  Family Communication: none at bedside Disposition:   Status is: Inpatient  Remains inpatient appropriate because:Inpatient level of care appropriate due to severity of illness   Dispo: The patient is from: Home              Anticipated d/c is to: Home              Patient currently is not medically stable to d/c.   Difficult to place patient No   Consultants:   ID  Cardiology  Procedures Echo IMPRESSIONS    1. Left ventricular ejection fraction, by estimation, is 60 to 65%. The  left ventricle has normal function. The left ventricle has no regional  wall motion abnormalities. Left ventricular diastolic parameters were  normal.  2. Right ventricular systolic function is normal. The right ventricular  size is normal.  3. The mitral valve is normal in structure. No evidence of mitral valve  regurgitation. No evidence of mitral stenosis.  4. Possible vegetation on tricuspid valve, 1.2 x 0.4cm mobile, septal  leaflet. Consider TEE for further clarification. The tricuspid valve is  abnormal.  5. The aortic valve is normal in structure. Aortic valve regurgitation is  not visualized. No aortic stenosis is present.  6. The inferior  vena cava is normal in size with greater than 50%  respiratory variability, suggesting right atrial pressure of 3 mmHg.  Antimicrobials:  Anti-infectives (From admission, onward)   Start     Dose/Rate Route Frequency Ordered Stop   01/10/21 1645  fluconazole (DIFLUCAN)  tablet 150 mg        150 mg Oral  Once 01/10/21 1554 01/10/21 1641   01/07/21 0715  cefTRIAXone (ROCEPHIN) 1 g in sodium chloride 0.9 % 100 mL IVPB        1 g 200 mL/hr over 30 Minutes Intravenous  Once 01/07/21 0710 01/07/21 0751     Subjective: Complaining of abdominal discomfort   Objective: Vitals:   01/11/21 1005 01/11/21 1504 01/11/21 1609 01/11/21 1701  BP: 117/82 126/88 115/74 103/62  Pulse: 96 (!) 124 (!) 115 (!) 108  Resp: 16 18  16   Temp: 99 F (37.2 C) 98.1 F (36.7 C) 99.8 F (37.7 C) 99.9 F (37.7 C)  TempSrc: Oral Oral Oral Oral  SpO2: 99% 98% 99% 98%  Weight:      Height:        Intake/Output Summary (Last 24 hours) at 01/11/2021 1732 Last data filed at 01/11/2021 1500 Gross per 24 hour  Intake 2516.48 ml  Output 500 ml  Net 2016.48 ml   Filed Weights   01/07/21 0600  Weight: 61.2 kg    Examination:  General exam: Appears calm and comfortable  Respiratory system: Clear to auscultation. Respiratory effort normal. Cardiovascular system: S1 & S2 heard, RRR.  Gastrointestinal system: L sided abdominal discomfort Central nervous system: Alert and oriented. No focal neurological deficits. Extremities: no LEE Skin: No rashes, lesions or ulcers Psychiatry: Judgement and insight appear normal. Mood & affect appropriate.     Data Reviewed: I have personally reviewed following labs and imaging studies  CBC: Recent Labs  Lab 01/07/21 0619 01/08/21 0221 01/09/21 0311 01/10/21 0257 01/11/21 0424  WBC 7.0 6.3 9.7 10.3 12.6*  NEUTROABS  --   --   --  2.7  --   HGB 13.1 12.5 11.9* 12.2 11.7*  HCT 38.2 38.3 36.7 37.0 34.5*  MCV 84.0 87.6 86.6 84.1 84.1  PLT 186 156 170 173 154    Basic Metabolic Panel: Recent Labs  Lab 01/07/21 0619 01/08/21 0221 01/09/21 0311 01/10/21 0257 01/11/21 0424  NA 137 138 135 135 139  K 3.5 3.3* 3.6 3.6 4.0  CL 106 108 111 110 108  CO2 18* 20* 12* 16* 20*  GLUCOSE 107* 83 71 120* 119*  BUN 6 <5* <5* <5* <5*   CREATININE 0.56 0.62 0.71 0.56 0.42*  CALCIUM 8.8* 8.3* 8.3* 8.3* 8.0*    GFR: Estimated Creatinine Clearance: 95.7 mL/min (Alexandra Young) (by C-G formula based on SCr of 0.42 mg/dL (L)).  Liver Function Tests: Recent Labs  Lab 01/07/21 0619 01/08/21 0221 01/09/21 0311 01/10/21 0257 01/11/21 0424  AST 30 39 42* 73* 124*  ALT 31 38 48* 89* 142*  ALKPHOS 42 50 83 131* 108  BILITOT 0.7 0.5 1.0 0.6 1.2  PROT 7.4 6.7 6.6 6.7 6.0*  ALBUMIN 4.0 3.3* 3.4* 3.4* 3.1*    CBG: No results for input(s): GLUCAP in the last 168 hours.   Recent Results (from the past 240 hour(s))  Resp Panel by RT-PCR (Flu Brallan Denio&B, Covid) Nasopharyngeal Swab     Status: None   Collection Time: 01/04/21  4:24 PM   Specimen: Nasopharyngeal Swab; Nasopharyngeal(NP) swabs in vial transport medium  Result Value Ref Range Status  SARS Coronavirus 2 by RT PCR NEGATIVE NEGATIVE Final    Comment: (NOTE) SARS-CoV-2 target nucleic acids are NOT DETECTED.  The SARS-CoV-2 RNA is generally detectable in upper respiratory specimens during the acute phase of infection. The lowest concentration of SARS-CoV-2 viral copies this assay can detect is 138 copies/mL. Mabell Esguerra negative result does not preclude SARS-Cov-2 infection and should not be used as the sole basis for treatment or other patient management decisions. Vanesha Athens negative result may occur with  improper specimen collection/handling, submission of specimen other than nasopharyngeal swab, presence of viral mutation(s) within the areas targeted by this assay, and inadequate number of viral copies(<138 copies/mL). Javien Tesch negative result must be combined with clinical observations, patient history, and epidemiological information. The expected result is Negative.  Fact Sheet for Patients:  BloggerCourse.com  Fact Sheet for Healthcare Providers:  SeriousBroker.it  This test is no t yet approved or cleared by the [place] FDA and  has  been authorized for detection and/or diagnosis of SARS-CoV-2 by FDA under an Emergency Use Authorization (EUA). This EUA will remain  in effect (meaning this test can be used) for the duration of the COVID-19 declaration under Section 564(b)(1) of the Act, 21 U.S.C.section 360bbb-3(b)(1), unless the authorization is terminated  or revoked sooner.       Influenza Deep [place] by PCR NEGATIVE NEGATIVE Final   Influenza B by PCR NEGATIVE NEGATIVE Final    Comment: (NOTE) The Xpert Xpress SARS-CoV-2/FLU/RSV plus assay is intended as an aid in the diagnosis of influenza from Nasopharyngeal swab specimens and should not be used as Nicasio Barlowe sole basis for treatment. Nasal washings and aspirates are unacceptable for Xpert Xpress SARS-CoV-2/FLU/RSV testing.  Fact Sheet for Patients: BloggerCourse.com  Fact Sheet for Healthcare Providers: SeriousBroker.it  This test is not yet approved or cleared by the [place] FDA and has been authorized for detection and/or diagnosis of SARS-CoV-2 by FDA under an Emergency Use Authorization (EUA). This EUA will remain in effect (meaning this test can be used) for the duration of the COVID-19 declaration under Section 564(b)(1) of the Act, 21 U.S.C. section 360bbb-3(b)(1), unless the authorization is terminated or revoked.  Performed at [facility], [address] [place], [place] [address]   Wet prep, genital     Status: Abnormal   Collection Time: 01/04/21  7:09 PM   Specimen: PATH Cytology Cervicovaginal Ancillary Only  Result Value Ref Range Status   Yeast Wet Prep HPF POC NONE SEEN NONE SEEN Final   Trich, Wet Prep NONE SEEN NONE SEEN Final   Clue Cells Wet Prep HPF POC PRESENT (Mariyam Remington) NONE SEEN Final   WBC, Wet Prep HPF POC MANY (Maciah Schweigert) NONE SEEN Final   Sperm NONE SEEN  Final    Comment: Performed at [facility], [address], [place], [place] [address]  Urine culture      Status: Abnormal   Collection Time: 01/07/21  9:32 AM   Specimen: Urine, Catheterized  Result Value Ref Range Status   Specimen Description   Final    URINE, CATHETERIZED Performed at [facility], [address], [place], [place] [address]    Special Requests   Final    NONE Performed at [facility], [address] [place], [place] [address]    Culture (Aadhira Heffernan)  Final    <10,000 COLONIES/mL INSIGNIFICANT GROWTH Performed at [facility], [address], [place], [place] [address]    Report Status 01/08/2021 FINAL  Final  Culture, blood (routine x 2)     Status: None (Preliminary result)   Collection Time: 01/07/21  1:39 PM   Specimen: BLOOD RIGHT HAND  Result Value Ref Range Status   Specimen Description   Final    BLOOD RIGHT HAND Performed at [facility], [address], [place], [place] [address]    Special Requests   Final    BOTTLES DRAWN AEROBIC ONLY Blood Culture adequate volume Performed at [facility], [address] [place], [place] [address]    Culture   Final    NO GROWTH 4 DAYS Performed at [facility], [address], [place], [place] [address]    Report Status PENDING  Incomplete  Culture, blood (routine x 2)     Status: None (Preliminary result)   Collection Time: 01/07/21  1:39 PM   Specimen: BLOOD LEFT HAND  Result Value Ref Range Status   Specimen Description   Final    BLOOD LEFT HAND Performed at [facility], [address], [place], [place] [address]    Special Requests   Final    BOTTLES DRAWN AEROBIC ONLY Blood Culture results may not be optimal due to an inadequate volume of blood received in culture bottles Performed at [facility], [address] [place], [place] [address]    Culture   Final    NO GROWTH 4 DAYS Performed at [facility], [address], [place], [place] [address]    Report Status PENDING   Incomplete  Blastomyces Antigen     Status: None   Collection Time: 01/07/21  6:45 PM  Result Value Ref Range Status   Blastomyces Antigen None Detected None Detected ng/mL Final    Comment: (NOTE) Results reported as ng/mL in 0.2 - 14.7 ng/mL range Results above the limit of detection but below 0.2 ng/mL are reported as 'Positive, Below the Limit of Quantification' Results above 14.7 ng/mL are reported as 'Positive, Above the Limit of Quantification'    Specimen Type SERUM  Final    Comment: (NOTE) Performed At: [facility] [address] [place], [place] [id number] Phylis Bougie MD BM:[id number]          Radiology Studies: NM Hepato W/EF  Result Date: 01/11/2021 CLINICAL DATA:  Abdominal pain EXAM: NUCLEAR MEDICINE HEPATOBILIARY IMAGING WITH GALLBLADDER EF TECHNIQUE: Sequential images of the abdomen were obtained out to 60 minutes following intravenous administration of radiopharmaceutical. After oral ingestion of Ensure, gallbladder ejection fraction was determined. At 60 min, normal ejection fraction is greater than 33%. RADIOPHARMACEUTICALS:  5.28 mCi Tc-78m  Choletec IV COMPARISON:  Ultrasound Jan 10, 2021 FINDINGS: There is slightly delayed but uniform radiotracer uptake by the liver with delayed hepatic radiotracer clearance. Normal filling of the intrahepatic ducts, common bile duct. Gallbladder activity is visualized, consistent with patency of cystic duct (normal < 60 minutes). Additionally there is normal biliary to bowel transit (normal < 60 minutes), consistent with patent common bile duct. Ensure was administered and the gallbladder appears to empty normally on sequential images. Calculated gallbladder ejection fraction is 29%. (Normal gallbladder ejection fraction with Ensure is greater than 33%.) No evidence of enterogastric biliary reflux. IMPRESSION: Slightly delayed hepatic radiotracer uptake with delayed hepatic clearance of radiotracer, suggestive of  hepatocellular dysfunction/hepatitis. In the setting of hepatocellular dysfunction the evaluation of cholecystitis by HIDA is somewhat limited. Within this context there is appropriate filling of the gallbladder before 60 minutes  indicating cystic duct patency excluding acute cholecystitis. However, there is Lavella Myren slightly reduced calculated gallbladder ejection fraction, which is at least in part artifactual given the persistent background hepatic counts projecting in the gallbladder fossa and delayed excretion of radiotracer into the biliary system, as such this study is equivocal in evaluation for chronic cholecystitis/biliary dyskinesia. Electronically Signed   By: Maudry Mayhew MD   On: 01/11/2021 15:47   ECHOCARDIOGRAM COMPLETE  Result Date: 01/10/2021    ECHOCARDIOGRAM REPORT   Patient Name:   [facility] Date of Exam: 01/10/2021 Medical Rec #:  [id number]     Height:       62.0 in Accession #:    [id number]    Weight:       135.0 lb Date of Birth:  Oct 12, 2000     BSA:          1.618 m Patient Age:    20 years      BP:           104/84 mmHg Patient Gender: F             HR:           110 bpm. Exam Location:  Inpatient Procedure: 2D Echo, Cardiac Doppler and Color Doppler Indications:    Fever R50.9  History:        Patient has no prior history of Echocardiogram examinations.  Sonographer:    Elmarie Shiley Dance Referring Phys: [id number] CORNELIUS N VAN DAM  Sonographer Comments: [place] Cardiologist notified. IMPRESSIONS  1. Left ventricular ejection fraction, by estimation, is 60 to 65%. The left ventricle has normal function. The left ventricle has no regional wall motion abnormalities. Left ventricular diastolic parameters were normal.  2. Right ventricular systolic function is normal. The right ventricular size is normal.  3. The mitral valve is normal in structure. No evidence of mitral valve regurgitation. No evidence of mitral stenosis.  4. Possible vegetation on tricuspid valve, 1.2 x 0.4cm mobile, septal  leaflet. Consider TEE for further clarification. The tricuspid valve is abnormal.  5. The aortic valve is normal in structure. Aortic valve regurgitation is not visualized. No aortic stenosis is present.  6. The inferior vena cava is normal in size with greater than 50% respiratory variability, suggesting right atrial pressure of 3 mmHg. FINDINGS  Left Ventricle: Left ventricular ejection fraction, by estimation, is 60 to 65%. The left ventricle has normal function. The left ventricle has no regional wall motion abnormalities. The left ventricular internal cavity size was normal in size. There is  no left ventricular hypertrophy. Left ventricular diastolic parameters were normal. Right Ventricle: The right ventricular size is normal. No increase in right ventricular wall thickness. Right ventricular systolic function is normal. Left Atrium: Left atrial size was normal in size. Right Atrium: Right atrial size was normal in size. Pericardium: There is no evidence of pericardial effusion. Mitral Valve: The mitral valve is normal in structure. No evidence of mitral valve regurgitation. No evidence of mitral valve stenosis. Tricuspid Valve: Possible vegetation on tricuspid valve, 1.2 x 0.4cm mobile, septal leaflet. Consider TEE for further clarification. The tricuspid valve is abnormal. Tricuspid valve regurgitation is not demonstrated. No evidence of tricuspid stenosis. Aortic Valve: The aortic valve is normal in structure. Aortic valve regurgitation is not visualized. No aortic stenosis is present. Pulmonic Valve: The pulmonic valve was normal in structure. Pulmonic valve regurgitation is not visualized. No evidence of pulmonic stenosis. Aorta: The aortic root is normal in size and structure. Venous:  The inferior vena cava is normal in size with greater than 50% respiratory variability, suggesting right atrial pressure of 3 mmHg. IAS/Shunts: No atrial level shunt detected by color flow Doppler.  LEFT VENTRICLE PLAX 2D  LVIDd:         3.60 cm  Diastology LVIDs:         2.70 cm  LV e' medial:    12.00 cm/s LV PW:         1.00 cm  LV E/e' medial:  8.8 LV IVS:        0.70 cm  LV e' lateral:   19.10 cm/s LVOT diam:     1.70 cm  LV E/e' lateral: 5.5 LV SV:         35 LV SV Index:   22 LVOT Area:     2.27 cm  RIGHT VENTRICLE             IVC RV Basal diam:  2.20 cm     IVC diam: 1.80 cm RV S prime:     12.30 cm/s TAPSE (M-mode): 2.5 cm LEFT ATRIUM             Index       RIGHT ATRIUM          Index LA diam:        3.10 cm 1.92 cm/m  RA Area:     8.27 cm LA Vol (A2C):   38.9 ml 24.05 ml/m RA Volume:   15.70 ml 9.71 ml/m LA Vol (A4C):   22.7 ml 14.03 ml/m LA Biplane Vol: 29.6 ml 18.30 ml/m  AORTIC VALVE LVOT Vmax:   109.00 cm/s LVOT Vmean:  66.600 cm/s LVOT VTI:    0.154 m  AORTA Ao Root diam: 2.40 cm Ao Asc diam:  2.40 cm MITRAL VALVE MV Area (PHT): 3.85 cm     SHUNTS MV Decel Time: 197 msec     Systemic VTI:  0.15 m MV E velocity: 106.00 cm/s  Systemic Diam: 1.70 cm MV Daysi Boggan velocity: 76.40 cm/s MV E/Jasiel Belisle ratio:  1.39 Donato Schultz MD Electronically signed by Donato Schultz MD Signature Date/Time: 01/10/2021/10:32:01 AM    Final    US Abdomen Limited RUQ (LIVER/GB)  Result Date: 01/10/2021 CLINICAL DATA:  Elevated liver enzymes with epigastric region pain EXAM: ULTRASOUND ABDOMEN LIMITED RIGHT UPPER QUADRANT COMPARISON:  CT abdomen and pelvis January 07, 2021 FINDINGS: Gallbladder: No evident gallstones. Gallbladder wall appears thickened. No pericholecystic fluid no sonographic Murphy sign noted by sonographer. Common bile duct: Diameter: 5 mm. No intrahepatic or extrahepatic biliary duct dilatation. Liver: No focal lesion identified. Within normal limits in parenchymal echogenicity. Portal vein is patent on color Doppler imaging with normal direction of blood flow towards the liver. Other: None. IMPRESSION: Thickening of the gallbladder wall noted. No gallstones evident. Question Devota Viruet degree of acalculus cholecystitis. This finding may warrant  nuclear medicine hepatobiliary imaging study to assess cystic duct patency. Study otherwise unremarkable. Electronically Signed   By: Bretta Bang III M.D.   On: 01/10/2021 14:10        Scheduled Meds: . busPIRone  10 mg Oral BID  . enoxaparin (LOVENOX) injection  40 mg Subcutaneous Q24H  . iohexol  500 mL Oral Q1H  . lamoTRIgine  50 mg Oral Daily  . loratadine  10 mg Oral Daily  . montelukast  10 mg Oral QHS  . ondansetron (ZOFRAN) IV  4 mg Intravenous Q8H   Or  . ondansetron  4 mg Oral  Q8H  . sodium chloride flush  10-40 mL Intracatheter Q12H   Continuous Infusions: . sodium chloride 75 mL/hr at 01/11/21 1500     LOS: 2 days    Time spent: over 30 min    Lacretia Nicksaldwell Powell, MD [facility]   To contact the attending provider between 7A-7P or the covering provider during after hours 7P-7A, please log into the web site [email or web address] and access using universal [place] password for that web site. If you do not have the password, please call the hospital operator.  01/11/2021, 5:32 PM

## 2021-01-11 NOTE — Progress Notes (Signed)
Provider Mansy notified of pt need for pain medication though she is having a nuclear medicine gall bladder study this am. Provider will place med orders in EMR. Pt continues to c/o 8/10 upper abdominal pain on left and right sides. Rn will continue to monitor.

## 2021-01-11 NOTE — Progress Notes (Addendum)
Subjective:  Abdominal pain is worsened dramatically and now is just in the left lower quadrant upper quadrant and also in the epigastric area as well.  Fevers returned and rash is worsening   Antibiotics:  Anti-infectives (From admission, onward)   Start     Dose/Rate Route Frequency Ordered Stop   01/10/21 1645  fluconazole (DIFLUCAN) tablet 150 mg        150 mg Oral  Once 01/10/21 1554 01/10/21 1641   01/07/21 0715  cefTRIAXone (ROCEPHIN) 1 g in sodium chloride 0.9 % 100 mL IVPB        1 g 200 mL/hr over 30 Minutes Intravenous  Once 01/07/21 0710 01/07/21 0751      Medications: Scheduled Meds: . busPIRone  10 mg Oral BID  . enoxaparin (LOVENOX) injection  40 mg Subcutaneous Q24H  . lamoTRIgine  50 mg Oral Daily  . loratadine  10 mg Oral Daily  . montelukast  10 mg Oral QHS  . ondansetron (ZOFRAN) IV  4 mg Intravenous Q8H   Or  . ondansetron  4 mg Oral Q8H  . sodium chloride flush  10-40 mL Intracatheter Q12H   Continuous Infusions: . sodium chloride 75 mL/hr at 01/11/21 1500   PRN Meds:.acetaminophen **OR** acetaminophen, albuterol, alum & mag hydroxide-simeth, diphenhydrAMINE, fluticasone, ketorolac, linaclotide, loperamide, morphine injection, oxyCODONE, prochlorperazine, sodium chloride flush, traZODone    Objective: Weight change:   Intake/Output Summary (Last 24 hours) at 01/11/2021 1607 Last data filed at 01/11/2021 1500 Gross per 24 hour  Intake 2516.48 ml  Output 500 ml  Net 2016.48 ml   Blood pressure 126/88, pulse (!) 124, temperature 98.1 F (36.7 C), temperature source Oral, resp. rate 18, height 5\' 2"  (1.575 m), weight 61.2 kg, last menstrual period 01/06/2021, SpO2 98 %. Temp:  [97.9 F (36.6 C)-102.9 F (39.4 C)] 98.1 F (36.7 C) (05/04 1504) Pulse Rate:  [90-124] 124 (05/04 1504) Resp:  [16-18] 18 (05/04 1504) BP: (103-126)/(68-88) 126/88 (05/04 1504) SpO2:  [98 %-100 %] 98 % (05/04 1504)  Physical Exam: Physical  Exam Constitutional:      General: She is not in acute distress.    Appearance: She is well-developed. She is not diaphoretic.  HENT:     Head: Normocephalic and atraumatic.     Right Ear: External ear normal.     Left Ear: External ear normal.     Mouth/Throat:     Pharynx: No oropharyngeal exudate.  Eyes:     General: No scleral icterus.    Conjunctiva/sclera: Conjunctivae normal.     Pupils: Pupils are equal, round, and reactive to light.  Cardiovascular:     Rate and Rhythm: Normal rate and regular rhythm.     Heart sounds: Normal heart sounds. No murmur heard. No friction rub. No gallop.   Pulmonary:     Effort: Pulmonary effort is normal. No respiratory distress.     Breath sounds: Normal breath sounds. No wheezing or rales.  Abdominal:     General: Bowel sounds are normal.     Palpations: Abdomen is soft.     Tenderness: There is abdominal tenderness in the epigastric area, periumbilical area, left upper quadrant and left lower quadrant.  Musculoskeletal:        General: No tenderness. Normal range of motion.  Lymphadenopathy:     Cervical: No cervical adenopathy.  Skin:    General: Skin is warm and dry.     Coloration: Skin is not pale.  Findings: Rash present. No erythema.  Neurological:     Mental Status: She is alert and oriented to person, place, and time.     Motor: No abnormal muscle tone.     Coordination: Coordination normal.  Psychiatric:        Attention and Perception: Attention normal.        Mood and Affect: Affect is tearful.        Speech: Speech normal.        Behavior: Behavior normal. Behavior is cooperative.        Thought Content: Thought content normal.        Cognition and Memory: Cognition and memory normal.        Judgment: Judgment normal.     Rash  01/09/2021:       01/10/2021:        Tongue  01/10/2021 (she says she also did burn her tongue w soup)    01/12/2021:        CBC:    BMET Recent Labs     01/10/21 0257 01/11/21 0424  NA 135 139  K 3.6 4.0  CL 110 108  CO2 16* 20*  GLUCOSE 120* 119*  BUN <5* <5*  CREATININE 0.56 0.42*  CALCIUM 8.3* 8.0*     Liver Panel  Recent Labs    01/10/21 0257 01/11/21 0424  PROT 6.7 6.0*  ALBUMIN 3.4* 3.1*  AST 73* 124*  ALT 89* 142*  ALKPHOS 131* 108  BILITOT 0.6 1.2       Sedimentation Rate No results for input(s): ESRSEDRATE in the last 72 hours. C-Reactive Protein No results for input(s): CRP in the last 72 hours.  Micro Results: Recent Results (from the past 720 hour(s))  Resp Panel by RT-PCR (Flu A&B, Covid) Nasopharyngeal Swab     Status: None   Collection Time: 01/04/21  4:24 PM   Specimen: Nasopharyngeal Swab; Nasopharyngeal(NP) swabs in vial transport medium  Result Value Ref Range Status   SARS Coronavirus 2 by RT PCR NEGATIVE NEGATIVE Final    Comment: (NOTE) SARS-CoV-2 target nucleic acids are NOT DETECTED.  The SARS-CoV-2 RNA is generally detectable in upper respiratory specimens during the acute phase of infection. The lowest concentration of SARS-CoV-2 viral copies this assay can detect is 138 copies/mL. A negative result does not preclude SARS-Cov-2 infection and should not be used as the sole basis for treatment or other patient management decisions. A negative result may occur with  improper specimen collection/handling, submission of specimen other than nasopharyngeal swab, presence of viral mutation(s) within the areas targeted by this assay, and inadequate number of viral copies(<138 copies/mL). A negative result must be combined with clinical observations, patient history, and epidemiological information. The expected result is Negative.  Fact Sheet for Patients:  BloggerCourse.comhttps://www.fda.gov/media/152166/download  Fact Sheet for Healthcare Providers:  SeriousBroker.ithttps://www.fda.gov/media/152162/download  This test is no t yet approved or cleared by the [place] FDA and  has been authorized for detection  and/or diagnosis of SARS-CoV-2 by FDA under an Emergency Use Authorization (EUA). This EUA will remain  in effect (meaning this test can be used) for the duration of the COVID-19 declaration under Section 564(b)(1) of the Act, 21 U.S.C.section 360bbb-3(b)(1), unless the authorization is terminated  or revoked sooner.       Influenza A by PCR NEGATIVE NEGATIVE Final   Influenza B by PCR NEGATIVE NEGATIVE Final    Comment: (NOTE) The Xpert Xpress SARS-CoV-2/FLU/RSV plus assay is intended as an aid in the diagnosis of  influenza from Nasopharyngeal swab specimens and should not be used as a sole basis for treatment. Nasal washings and aspirates are unacceptable for Xpert Xpress SARS-CoV-2/FLU/RSV testing.  Fact Sheet for Patients: BloggerCourse.com  Fact Sheet for Healthcare Providers: SeriousBroker.it  This test is not yet approved or cleared by the [place] FDA and has been authorized for detection and/or diagnosis of SARS-CoV-2 by FDA under an Emergency Use Authorization (EUA). This EUA will remain in effect (meaning this test can be used) for the duration of the COVID-19 declaration under Section 564(b)(1) of the Act, 21 U.S.C. section 360bbb-3(b)(1), unless the authorization is terminated or revoked.  Performed at [facility], [address], [place], [place] [address]   Wet prep, genital     Status: Abnormal   Collection Time: 01/04/21  7:09 PM   Specimen: PATH Cytology Cervicovaginal Ancillary Only  Result Value Ref Range Status   Yeast Wet Prep HPF POC NONE SEEN NONE SEEN Final   Trich, Wet Prep NONE SEEN NONE SEEN Final   Clue Cells Wet Prep HPF POC PRESENT (A) NONE SEEN Final   WBC, Wet Prep HPF POC MANY (A) NONE SEEN Final   Sperm NONE SEEN  Final    Comment: Performed at [facility], [address], [place], [place] [address]  Urine culture     Status: Abnormal    Collection Time: 01/07/21  9:32 AM   Specimen: Urine, Catheterized  Result Value Ref Range Status   Specimen Description   Final    URINE, CATHETERIZED Performed at [facility], [address] [place], [place] [address]    Special Requests   Final    NONE Performed at [facility], [address], [place], [place] [address]    Culture (A)  Final    <10,000 COLONIES/mL INSIGNIFICANT GROWTH Performed at [facility], [address] [place], [place] [address]    Report Status 01/08/2021 FINAL  Final  Culture, blood (routine x 2)     Status: None (Preliminary result)   Collection Time: 01/07/21  1:39 PM   Specimen: BLOOD RIGHT HAND  Result Value Ref Range Status   Specimen Description   Final    BLOOD RIGHT HAND Performed at [facility], [address] [place], [place] [address]    Special Requests   Final    BOTTLES DRAWN AEROBIC ONLY Blood Culture adequate volume Performed at [facility], [address], [place], [place] [address]    Culture   Final    NO GROWTH 4 DAYS Performed at [facility], [address] [place], [place] [address]    Report Status PENDING  Incomplete  Culture, blood (routine x 2)     Status: None (Preliminary result)   Collection Time: 01/07/21  1:39 PM   Specimen: BLOOD LEFT HAND  Result Value Ref Range Status   Specimen Description   Final    BLOOD LEFT HAND Performed at [facility], [address], [place], [place] [address]    Special Requests   Final    BOTTLES DRAWN AEROBIC ONLY Blood Culture results may not be optimal due to an inadequate volume of blood received in culture bottles Performed at [facility], [address], [place], [place] [address]    Culture   Final    NO GROWTH 4 DAYS Performed at [facility], [address], [place], [place]  [address]    Report Status PENDING  Incomplete   Blastomyces Antigen     Status: None   Collection Time: 01/07/21  6:45 PM  Result Value Ref Range Status   Blastomyces Antigen None Detected None Detected ng/mL Final    Comment: (NOTE) Results reported as ng/mL in 0.2 - 14.7 ng/mL range Results above the limit of detection but below 0.2 ng/mL are reported as 'Positive, Below the Limit of Quantification' Results above 14.7 ng/mL are reported as 'Positive, Above the Limit of Quantification'    Specimen Type SERUM  Final    Comment: (NOTE) Performed At: [facility] [address] [place], [place] [id number] Phylis Bougie MD EH:[id number]     Studies/Results: NM Hepato W/EF  Result Date: 01/11/2021 CLINICAL DATA:  Abdominal pain EXAM: NUCLEAR MEDICINE HEPATOBILIARY IMAGING WITH GALLBLADDER EF TECHNIQUE: Sequential images of the abdomen were obtained out to 60 minutes following intravenous administration of radiopharmaceutical. After oral ingestion of Ensure, gallbladder ejection fraction was determined. At 60 min, normal ejection fraction is greater than 33%. RADIOPHARMACEUTICALS:  5.28 mCi Tc-45m  Choletec IV COMPARISON:  Ultrasound Jan 10, 2021 FINDINGS: There is slightly delayed but uniform radiotracer uptake by the liver with delayed hepatic radiotracer clearance. Normal filling of the intrahepatic ducts, common bile duct. Gallbladder activity is visualized, consistent with patency of cystic duct (normal < 60 minutes). Additionally there is normal biliary to bowel transit (normal < 60 minutes), consistent with patent common bile duct. Ensure was administered and the gallbladder appears to empty normally on sequential images. Calculated gallbladder ejection fraction is 29%. (Normal gallbladder ejection fraction with Ensure is greater than 33%.) No evidence of enterogastric biliary reflux. IMPRESSION: Slightly delayed hepatic radiotracer uptake with delayed hepatic clearance of radiotracer, suggestive of hepatocellular  dysfunction/hepatitis. In the setting of hepatocellular dysfunction the evaluation of cholecystitis by HIDA is somewhat limited. Within this context there is appropriate filling of the gallbladder before 60 minutes indicating cystic duct patency excluding acute cholecystitis. However, there is a slightly reduced calculated gallbladder ejection fraction, which is at least in part artifactual given the persistent background hepatic counts projecting in the gallbladder fossa and delayed excretion of radiotracer into the biliary system, as such this study is equivocal in evaluation for chronic cholecystitis/biliary dyskinesia. Electronically Signed   By: Maudry Mayhew MD   On: 01/11/2021 15:47   ECHOCARDIOGRAM COMPLETE  Result Date: 01/10/2021    ECHOCARDIOGRAM REPORT   Patient Name:   [facility] Date of Exam: 01/10/2021 Medical Rec #:  [id number]     Height:       62.0 in Accession #:    [id number]    Weight:       135.0 lb Date of Birth:  11-20-2000     BSA:          1.618 m Patient Age:    20 years      BP:           104/84 mmHg Patient Gender: F             HR:           110 bpm. Exam Location:  Inpatient Procedure: 2D Echo, Cardiac Doppler and Color Doppler Indications:    Fever R50.9  History:        Patient has no prior history of Echocardiogram examinations.  Sonographer:    Elmarie Shiley Dance Referring Phys: [id number] Isaiahs Chancy N VAN DAM  Sonographer Comments: Reading Cardiologist notified. IMPRESSIONS  1. Left ventricular ejection  fraction, by estimation, is 60 to 65%. The left ventricle has normal function. The left ventricle has no regional wall motion abnormalities. Left ventricular diastolic parameters were normal.  2. Right ventricular systolic function is normal. The right ventricular size is normal.  3. The mitral valve is normal in structure. No evidence of mitral valve regurgitation. No evidence of mitral stenosis.  4. Possible vegetation on tricuspid valve, 1.2 x 0.4cm mobile, septal leaflet. Consider TEE  for further clarification. The tricuspid valve is abnormal.  5. The aortic valve is normal in structure. Aortic valve regurgitation is not visualized. No aortic stenosis is present.  6. The inferior vena cava is normal in size with greater than 50% respiratory variability, suggesting right atrial pressure of 3 mmHg. FINDINGS  Left Ventricle: Left ventricular ejection fraction, by estimation, is 60 to 65%. The left ventricle has normal function. The left ventricle has no regional wall motion abnormalities. The left ventricular internal cavity size was normal in size. There is  no left ventricular hypertrophy. Left ventricular diastolic parameters were normal. Right Ventricle: The right ventricular size is normal. No increase in right ventricular wall thickness. Right ventricular systolic function is normal. Left Atrium: Left atrial size was normal in size. Right Atrium: Right atrial size was normal in size. Pericardium: There is no evidence of pericardial effusion. Mitral Valve: The mitral valve is normal in structure. No evidence of mitral valve regurgitation. No evidence of mitral valve stenosis. Tricuspid Valve: Possible vegetation on tricuspid valve, 1.2 x 0.4cm mobile, septal leaflet. Consider TEE for further clarification. The tricuspid valve is abnormal. Tricuspid valve regurgitation is not demonstrated. No evidence of tricuspid stenosis. Aortic Valve: The aortic valve is normal in structure. Aortic valve regurgitation is not visualized. No aortic stenosis is present. Pulmonic Valve: The pulmonic valve was normal in structure. Pulmonic valve regurgitation is not visualized. No evidence of pulmonic stenosis. Aorta: The aortic root is normal in size and structure. Venous: The inferior vena cava is normal in size with greater than 50% respiratory variability, suggesting right atrial pressure of 3 mmHg. IAS/Shunts: No atrial level shunt detected by color flow Doppler.  LEFT VENTRICLE PLAX 2D LVIDd:         3.60 cm   Diastology LVIDs:         2.70 cm  LV e' medial:    12.00 cm/s LV PW:         1.00 cm  LV E/e' medial:  8.8 LV IVS:        0.70 cm  LV e' lateral:   19.10 cm/s LVOT diam:     1.70 cm  LV E/e' lateral: 5.5 LV SV:         35 LV SV Index:   22 LVOT Area:     2.27 cm  RIGHT VENTRICLE             IVC RV Basal diam:  2.20 cm     IVC diam: 1.80 cm RV S prime:     12.30 cm/s TAPSE (M-mode): 2.5 cm LEFT ATRIUM             Index       RIGHT ATRIUM          Index LA diam:        3.10 cm 1.92 cm/m  RA Area:     8.27 cm LA Vol (A2C):   38.9 ml 24.05 ml/m RA Volume:   15.70 ml 9.71 ml/m LA Vol (A4C):   22.7  ml 14.03 ml/m LA Biplane Vol: 29.6 ml 18.30 ml/m  AORTIC VALVE LVOT Vmax:   109.00 cm/s LVOT Vmean:  66.600 cm/s LVOT VTI:    0.154 m  AORTA Ao Root diam: 2.40 cm Ao Asc diam:  2.40 cm MITRAL VALVE MV Area (PHT): 3.85 cm     SHUNTS MV Decel Time: 197 msec     Systemic VTI:  0.15 m MV E velocity: 106.00 cm/s  Systemic Diam: 1.70 cm MV A velocity: 76.40 cm/s MV E/A ratio:  1.39 Donato Schultz MD Electronically signed by Donato Schultz MD Signature Date/Time: 01/10/2021/10:32:01 AM    Final    US Abdomen Limited RUQ (LIVER/GB)  Result Date: 01/10/2021 CLINICAL DATA:  Elevated liver enzymes with epigastric region pain EXAM: ULTRASOUND ABDOMEN LIMITED RIGHT UPPER QUADRANT COMPARISON:  CT abdomen and pelvis January 07, 2021 FINDINGS: Gallbladder: No evident gallstones. Gallbladder wall appears thickened. No pericholecystic fluid no sonographic Murphy sign noted by sonographer. Common bile duct: Diameter: 5 mm. No intrahepatic or extrahepatic biliary duct dilatation. Liver: No focal lesion identified. Within normal limits in parenchymal echogenicity. Portal vein is patent on color Doppler imaging with normal direction of blood flow towards the liver. Other: None. IMPRESSION: Thickening of the gallbladder wall noted. No gallstones evident. Question a degree of acalculus cholecystitis. This finding may warrant nuclear medicine  hepatobiliary imaging study to assess cystic duct patency. Study otherwise unremarkable. Electronically Signed   By: Bretta Bang III M.D.   On: 01/10/2021 14:10      Assessment/Plan:  INTERVAL HISTORY: Fevers returned, rash worsened LFTs worse and abdominal pain is worsened significantly   Principal Problem:   Fever of unknown origin Active Problems:   Intractable nausea and vomiting   Intractable pain   Pelvic pain   Splenomegaly   Elevated LFTs   Epigastric pain   Left flank pain   Rash   Endocarditis of tricuspid valve   Transaminitis    Alexandra Young is a 20 y.o. female with admission with abdominal pain pelvic pain and high fevers now with a rash.  The scan had shown subtle splenomegaly and some retroperitoneal lymph nodes.  She also had some mild transaminitis on her liver function test.  2D echocardiogram shows possible vegetation on tricuspid valve. Lfts t continue to worsen.  HIDA scan was not capable of definitively ruling out cholecystitis.  Lipase as in the 70s yesterday  Her fevers have returned and her abdominal pain is dramatically worse compared to yesterday she also is tearful.  While some of her pain is undoubtedly due to the fact she has not found to have any opiates over the last preceding 12 hours the location of the pain is much different and she now has some suggestion of present peritoneal signs  #1 Fever unknown origin:  Given the worsening abdominal pain I will get a repeat CT with contrast today   To get a transesophageal echocardiogram tomorrow  Trends LFTS, repeat CBC tomorrow and lipase  I would contemplate giving her empiric treatment for RMSF or early Kia but I do not feel that her clinical picture are not really feet fits very well for that.  Doxycycline would likely relatively harmless but also do not want to obfuscated her work-up.  We will send an ANCA tomorrow.  If she continues to have no clear-cut cause of her constellation of  symptoms I would also recommend getting a biopsy of her rash  I do not think the rash is due to opiates and  it is worse from yesterday through today.  I spent greater than 35 minutes with the patient including greater than 50% of time in face to face counsel of the patient and also her mother he was on her cell phone speaker regards to the work-up for all of the above-mentioned problems and in coordination of her care with Dr. Lowell Guitar.   Dr Lowell Guitar has noted that the patient recently started Lamictal and certainly it is possible that the patient is having a DRESS like symptom complex given this drug was started recently and it could provide a unifying diagnosis for most of her picture.  LOS: 2 days   Acey Lav 01/11/2021, 4:07 PM

## 2021-01-11 NOTE — Plan of Care (Signed)
Plan of care discussed with pt.

## 2021-01-11 NOTE — Progress Notes (Signed)
Pt is back in yellow mews, hr 124. Pt complaining of abd pain, medicated with oxycodone. Notified Dr Elijah Birk, he was just in to see pt. Pt states she also feels anxious. Am dose of buspar given and emotional support provided. Per Dr Elijah Birk if pain does not improve give IV morphine.

## 2021-01-11 NOTE — Progress Notes (Signed)
    [facility] has been requested to perform a transesophageal echocardiogram on Alexandra Young for Possible vegetation on tricuspid valve.  After careful review of history and examination, the risks and benefits of transesophageal echocardiogram have been explained including risks of esophageal damage, perforation (1:10,000 risk), bleeding, pharyngeal hematoma as well as other potential complications associated with conscious sedation including aspiration, arrhythmia, respiratory failure and death. Alternatives to treatment were discussed, questions were answered. Patient AND mother [name]) are willing to proceed.  TEE - Dr. Anne Fu  01/12/21 @ 1400 . NPO after midnight. Meds with sips.   Manson Passey, PA-C 01/11/2021 3:45 PM

## 2021-01-12 ENCOUNTER — Inpatient Hospital Stay (HOSPITAL_COMMUNITY): Payer: Medicaid Other

## 2021-01-12 ENCOUNTER — Inpatient Hospital Stay (HOSPITAL_COMMUNITY): Payer: Medicaid Other | Admitting: Anesthesiology

## 2021-01-12 ENCOUNTER — Encounter (HOSPITAL_COMMUNITY): Payer: Self-pay | Admitting: Internal Medicine

## 2021-01-12 ENCOUNTER — Encounter (HOSPITAL_COMMUNITY)
Admission: EM | Disposition: A | Discharge status: Home / Self Care | Payer: Self-pay | Source: Home / Self Care | Attending: Family Medicine

## 2021-01-12 DIAGNOSIS — T50905A Adverse effect of unspecified drugs, medicaments and biological substances, initial encounter: Secondary | ICD-10-CM | POA: Diagnosis not present

## 2021-01-12 DIAGNOSIS — R7881 Bacteremia: Secondary | ICD-10-CM | POA: Diagnosis not present

## 2021-01-12 DIAGNOSIS — I079 Rheumatic tricuspid valve disease, unspecified: Secondary | ICD-10-CM | POA: Diagnosis not present

## 2021-01-12 DIAGNOSIS — F3160 Bipolar disorder, current episode mixed, unspecified: Secondary | ICD-10-CM

## 2021-01-12 DIAGNOSIS — R7989 Other specified abnormal findings of blood chemistry: Secondary | ICD-10-CM | POA: Diagnosis not present

## 2021-01-12 DIAGNOSIS — R509 Fever, unspecified: Secondary | ICD-10-CM | POA: Diagnosis not present

## 2021-01-12 DIAGNOSIS — F908 Attention-deficit hyperactivity disorder, other type: Secondary | ICD-10-CM

## 2021-01-12 DIAGNOSIS — F988 Other specified behavioral and emotional disorders with onset usually occurring in childhood and adolescence: Secondary | ICD-10-CM

## 2021-01-12 DIAGNOSIS — D7212 Drug rash with eosinophilia and systemic symptoms syndrome: Secondary | ICD-10-CM | POA: Diagnosis not present

## 2021-01-12 HISTORY — DX: Drug rash with eosinophilia and systemic symptoms syndrome: D72.12

## 2021-01-12 HISTORY — PX: TEE WITHOUT CARDIOVERSION: SHX5443

## 2021-01-12 LAB — COMPREHENSIVE METABOLIC PANEL
ALT: 227 U/L — ABNORMAL HIGH (ref 0–44)
AST: 146 U/L — ABNORMAL HIGH (ref 15–41)
Albumin: 3 g/dL — ABNORMAL LOW (ref 3.5–5.0)
Alkaline Phosphatase: 148 U/L — ABNORMAL HIGH (ref 38–126)
Anion gap: 9 (ref 5–15)
BUN: <5 mg/dL — ABNORMAL LOW (ref 6–20)
CO2: 23 mmol/L (ref 22–32)
Calcium: 8.1 mg/dL — ABNORMAL LOW (ref 8.9–10.3)
Chloride: 105 mmol/L (ref 98–111)
Creatinine, Ser: 0.51 mg/dL (ref 0.44–1.00)
GFR, Estimated: >60 mL/min (ref >60)
Glucose, Bld: 105 mg/dL — ABNORMAL HIGH (ref 70–99)
Potassium: 3.2 mmol/L — ABNORMAL LOW (ref 3.5–5.1)
Sodium: 137 mmol/L (ref 135–145)
Total Bilirubin: 1.4 mg/dL — ABNORMAL HIGH (ref 0.3–1.2)
Total Protein: 6.2 g/dL — ABNORMAL LOW (ref 6.5–8.1)

## 2021-01-12 LAB — CULTURE, BLOOD (ROUTINE X 2)
Culture: NO GROWTH
Culture: NO GROWTH
Special Requests: ADEQUATE

## 2021-01-12 LAB — PROTIME-INR
INR: 1.2 (ref 0.8–1.2)
Prothrombin Time: 15.6 seconds — ABNORMAL HIGH (ref 11.4–15.2)

## 2021-01-12 LAB — CBC WITH DIFFERENTIAL/PLATELET
Abs Immature Granulocytes: 0.22 10*3/uL — ABNORMAL HIGH (ref 0.00–0.07)
Basophils Absolute: 0.1 10*3/uL (ref 0.0–0.1)
Basophils Relative: 0 %
Eosinophils Absolute: 1.2 10*3/uL — ABNORMAL HIGH (ref 0.0–0.5)
Eosinophils Relative: 9 %
HCT: 36.7 % (ref 36.0–46.0)
Hemoglobin: 12.5 g/dL (ref 12.0–15.0)
Immature Granulocytes: 2 %
Lymphocytes Relative: 42 %
Lymphs Abs: 6 10*3/uL — ABNORMAL HIGH (ref 0.7–4.0)
MCH: 28.6 pg (ref 26.0–34.0)
MCHC: 34.1 g/dL (ref 30.0–36.0)
MCV: 84 fL (ref 80.0–100.0)
Monocytes Absolute: 2.2 10*3/uL — ABNORMAL HIGH (ref 0.1–1.0)
Monocytes Relative: 16 %
Neutro Abs: 4.2 10*3/uL (ref 1.7–7.7)
Neutrophils Relative %: 31 %
Platelets: 203 10*3/uL (ref 150–400)
RBC: 4.37 MIL/uL (ref 3.87–5.11)
RDW: 14 % (ref 11.5–15.5)
WBC: 13.9 10*3/uL — ABNORMAL HIGH (ref 4.0–10.5)
nRBC: 0 % (ref 0.0–0.2)

## 2021-01-12 LAB — PATHOLOGIST SMEAR REVIEW

## 2021-01-12 LAB — MAGNESIUM: Magnesium: 2.2 mg/dL (ref 1.7–2.4)

## 2021-01-12 LAB — PHOSPHORUS: Phosphorus: 2.9 mg/dL (ref 2.5–4.6)

## 2021-01-12 SURGERY — ECHOCARDIOGRAM, TRANSESOPHAGEAL
Anesthesia: Monitor Anesthesia Care

## 2021-01-12 MED ORDER — MIDAZOLAM HCL (PF) 5 MG/ML IJ SOLN
INTRAMUSCULAR | Status: AC
  Filled 2021-01-12: qty 2

## 2021-01-12 MED ORDER — FENTANYL CITRATE (PF) 100 MCG/2ML IJ SOLN
INTRAMUSCULAR | Status: AC
  Filled 2021-01-12: qty 4

## 2021-01-12 MED ORDER — FENTANYL CITRATE (PF) 100 MCG/2ML IJ SOLN
INTRAMUSCULAR | Status: DC | PRN
  Administered 2021-01-12 (×2): 25 ug via INTRAVENOUS

## 2021-01-12 MED ORDER — POTASSIUM CHLORIDE 2 MEQ/ML IV SOLN
INTRAVENOUS | Status: AC
  Filled 2021-01-12 (×2): qty 1000

## 2021-01-12 MED ORDER — DIPHENHYDRAMINE HCL 50 MG PO CAPS
50.0000 mg | ORAL_CAPSULE | Freq: Four times a day (QID) | ORAL | Status: DC | PRN
  Administered 2021-01-13 – 2021-01-14 (×5): 50 mg via ORAL
  Filled 2021-01-12 (×5): qty 1

## 2021-01-12 MED ORDER — POTASSIUM CHLORIDE 10 MEQ/100ML IV SOLN
10.0000 meq | INTRAVENOUS | Status: AC
  Administered 2021-01-12: 10 meq via INTRAVENOUS
  Filled 2021-01-12: qty 100

## 2021-01-12 MED ORDER — PREDNISONE 50 MG PO TABS
60.0000 mg | ORAL_TABLET | Freq: Every day | ORAL | Status: DC
  Administered 2021-01-12: 60 mg via ORAL
  Filled 2021-01-12: qty 3
  Filled 2021-01-12: qty 1

## 2021-01-12 MED ORDER — ARIPIPRAZOLE 5 MG PO TABS
5.0000 mg | ORAL_TABLET | Freq: Every day | ORAL | Status: DC
  Administered 2021-01-13 – 2021-01-16 (×4): 5 mg via ORAL
  Filled 2021-01-12 (×4): qty 1

## 2021-01-12 MED ORDER — ARIPIPRAZOLE 2 MG PO TABS
2.0000 mg | ORAL_TABLET | Freq: Once | ORAL | Status: DC
  Filled 2021-01-12: qty 1

## 2021-01-12 MED ORDER — DIPHENHYDRAMINE HCL 50 MG/ML IJ SOLN
12.5000 mg | Freq: Once | INTRAMUSCULAR | Status: AC | PRN
  Administered 2021-01-12: 12.5 mg via INTRAVENOUS
  Filled 2021-01-12: qty 1

## 2021-01-12 MED ORDER — MIDAZOLAM HCL (PF) 10 MG/2ML IJ SOLN
INTRAMUSCULAR | Status: DC | PRN
  Administered 2021-01-12: 2 mg via INTRAVENOUS
  Administered 2021-01-12 (×2): 1 mg via INTRAVENOUS

## 2021-01-12 MED ORDER — DIPHENHYDRAMINE HCL 25 MG PO CAPS: 25.0000 mg | ORAL_CAPSULE | Freq: Four times a day (QID) | ORAL | Status: DC | PRN

## 2021-01-12 MED ORDER — POTASSIUM CHLORIDE CRYS ER 20 MEQ PO TBCR
40.0000 meq | EXTENDED_RELEASE_TABLET | ORAL | Status: DC
  Filled 2021-01-12: qty 2

## 2021-01-12 NOTE — Progress Notes (Signed)
Pt complaining of arm hurting with potassium infusion, dr caldwell on floor, notified him. Instructed to finish this bag and not hang the second bag.

## 2021-01-12 NOTE — Progress Notes (Addendum)
HEMATOLOGY-ONCOLOGY PROGRESS NOTE  SUBJECTIVE: The patient continues to have fevers.  Abdominal pain improving.  Repeat CT abdomen/pelvis performed yesterday showed progressive gallbladder wall thickening and pericholecystic fluid with development of periportal edema which could represent a calculus cholecystitis, progressive hepatic dysfunction, or hepatitis, splenomegaly and retroperitoneal lymphadenopathy which is not appreciably changed-findings could be reactive versus underlying lymphoproliferative disorder.  She is scheduled for TEE today.  REVIEW OF SYSTEMS:   Constitutional: The patient continues to have intermittent fevers Eyes: Denies blurriness of vision Ears, nose, mouth, throat, and face: Denies mucositis or sore throat Respiratory: Denies cough, dyspnea or wheezes Cardiovascular: Denies palpitation, chest discomfort Gastrointestinal: Still has abdominal pain which has improved Skin: Rash noted to chest, arms, legs, feet Neurological:Denies numbness, tingling or new weaknesses Behavioral/Psych: Mood is stable, no new changes  Extremities: No lower extremity edema All other systems were reviewed with the patient and are negative.  I have reviewed the past medical history, past surgical history, social history and family history with the patient and they are unchanged from previous note.   PHYSICAL EXAMINATION:  Vitals:   01/12/21 0252 01/12/21 0653  BP: 108/67 105/70  Pulse: 93 (!) 104  Resp: 16 20  Temp: 98.8 F (37.1 C) 99.9 F (37.7 C)  SpO2: 99% 100%   Filed Weights   01/07/21 0600  Weight: 61.2 kg    Intake/Output from previous day: 05/04 0701 - 05/05 0700 In: 1003.7 [P.O.:210; I.V.:793.7] Out: -   GENERAL:alert, no distress and comfortable SKIN: Diffuse morbilliform erythematous rash most notable on the back, abdomen, arms EYES: normal, Conjunctiva are pink and non-injected, sclera clear LUNGS: clear to auscultation and percussion with normal breathing  effort HEART: regular rate & rhythm and no murmurs and no lower extremity edema ABDOMEN: Positive bowel sounds, soft, tenderness to the left side of her abdomen  NEURO: alert & oriented x 3 with fluent speech, no focal motor/sensory deficits  LABORATORY DATA:  I have reviewed the data as listed CMP Latest Ref Rng & Units 01/12/2021 01/11/2021 01/10/2021  Glucose 70 - 99 mg/dL 105(H) 119(H) 120(H)  BUN 6 - 20 mg/dL <5(L) <5(L) <5(L)  Creatinine 0.44 - 1.00 mg/dL 0.51 0.42(L) 0.56  Sodium 135 - 145 mmol/L 137 139 135  Potassium 3.5 - 5.1 mmol/L 3.2(L) 4.0 3.6  Chloride 98 - 111 mmol/L 105 108 110  CO2 22 - 32 mmol/L 23 20(L) 16(L)  Calcium 8.9 - 10.3 mg/dL 8.1(L) 8.0(L) 8.3(L)  Total Protein 6.5 - 8.1 g/dL 6.2(L) 6.0(L) 6.7  Total Bilirubin 0.3 - 1.2 mg/dL 1.4(H) 1.2 0.6  Alkaline Phos 38 - 126 U/L 148(H) 108 131(H)  AST 15 - 41 U/L 146(H) 124(H) 73(H)  ALT 0 - 44 U/L 227(H) 142(H) 89(H)    Lab Results  Component Value Date   WBC 13.9 (H) 01/12/2021   HGB 12.5 01/12/2021   HCT 36.7 01/12/2021   MCV 84.0 01/12/2021   PLT 203 01/12/2021   NEUTROABS 4.2 01/12/2021    CT CHEST W CONTRAST  Result Date: 01/09/2021 CLINICAL DATA:  Fever of unknown origin, prominent spleen EXAM: CT CHEST WITH CONTRAST TECHNIQUE: Multidetector CT imaging of the chest was performed during intravenous contrast administration. CONTRAST:  45mL OMNIPAQUE IOHEXOL 300 MG/ML  SOLN COMPARISON:  None. FINDINGS: Cardiovascular: Heart is normal in size.  No pericardial effusion. No evidence of thoracic aortic aneurysm. Mediastinum/Nodes: No suspicious mediastinal lymphadenopathy. Small bilateral axillary nodes measuring up to 8 mm short axis on the left (series 2/image 26), likely reactive.  Triangular soft tissue in the anterior mediastinum (series 2/image 49) reflects residual thymus. Visualized thyroid is unremarkable. Lungs/Pleura: Lungs are clear. No suspicious pulmonary nodules. No focal consolidation. Trace right pleural  effusion (series 2/image 110). No pneumothorax. Upper Abdomen: Visualized upper abdomen is unchanged from recent CT abdomen/pelvis, noting a prominent spleen. Musculoskeletal: Visualized osseous structures are within normal limits. IMPRESSION: Trace right pleural effusion. Otherwise negative CT chest. Electronically Signed   By: Julian Hy M.D.   On: 01/09/2021 10:39   US Pelvis Complete  Result Date: 01/04/2021 CLINICAL DATA:  Initial evaluation for acute left pelvic pain, evaluate for torsion. EXAM: TRANSABDOMINAL ULTRASOUND OF PELVIS DOPPLER ULTRASOUND OF OVARIES TECHNIQUE: Transabdominal ultrasound examination of the pelvis was performed including evaluation of the uterus, ovaries, adnexal regions, and pelvic cul-de-sac. Color and duplex Doppler ultrasound was utilized to evaluate blood flow to the ovaries. COMPARISON:  Prior CT from earlier the same day. FINDINGS: Uterus Measurements: 6.1 x 2.9 x 4.0 cm = volume: 37.7 mL. Uterus is retroverted. No discrete fibroid or other mass. Endometrium Thickness: 5.0 mm.  No focal abnormality visualized. Right ovary Measurements: 3.1 x 1.5 x 2.2 cm = volume: 5.4 mL. Normal appearance/no adnexal mass. Left ovary Measurements: 3.4 x 2.2 x 2.7 cm = volume: 11.3 mL. Normal appearance/no adnexal mass. 1.8 cm dominant follicle noted. Pulsed Doppler evaluation demonstrates normal low-resistance arterial and venous waveforms in both ovaries. Other: Trace free fluid within the pelvis, likely physiologic. IMPRESSION: 1. 1.8 cm left ovarian dominant follicle, which could contribute to left-sided pelvic pain. Associated trace free fluid within the pelvis. 2. Otherwise unremarkable and normal pelvic ultrasound. No evidence for ovarian torsion. Electronically Signed   By: Jeannine Boga M.D.   On: 01/04/2021 19:58   CT ABDOMEN PELVIS W CONTRAST  Result Date: 01/11/2021 CLINICAL DATA:  Abdominal pain, fever, rash EXAM: CT ABDOMEN AND PELVIS WITH CONTRAST TECHNIQUE:  Multidetector CT imaging of the abdomen and pelvis was performed using the standard protocol following bolus administration of intravenous contrast. CONTRAST:  40mL OMNIPAQUE IOHEXOL 300 MG/ML  SOLN COMPARISON:  01/07/2021 FINDINGS: Lower chest: No acute pleural or parenchymal lung disease. Hepatobiliary: Interval progression of the gallbladder wall thickening and pericholecystic fluid seen on prior ultrasound. Additionally, periportal edema has developed. No focal liver parenchymal abnormalities. No biliary dilation. Pancreas: Unremarkable. No pancreatic ductal dilatation or surrounding inflammatory changes. Spleen: Stable splenomegaly without focal parenchymal abnormality. Adrenals/Urinary Tract: Adrenal glands are unremarkable. Kidneys are normal, without renal calculi, focal lesion, or hydronephrosis. Bladder is unremarkable. Stomach/Bowel: No bowel obstruction or ileus. Normal appendix right lower quadrant. No bowel wall thickening or inflammatory change. Vascular/Lymphatic: No significant vascular findings are present. Stable retroperitoneal lymphadenopathy, largest lymph node in the left para-aortic region measuring up to 21 x 11 mm. Stable borderline enlarged bilateral inguinal lymph nodes. Reproductive: Uterus and bilateral adnexa are unremarkable. Other: There is a small amount of ascites within the lower pelvis, increased in volume since prior study. No free intraperitoneal gas. No abdominal wall hernia. Musculoskeletal: No acute or destructive bony lesions. Reconstructed images demonstrate no additional findings. IMPRESSION: 1. Progressive gallbladder wall thickening and pericholecystic fluid, with development of periportal edema. Findings could reflect acalculous cholecystitis, progressive hepatic dysfunction, or hepatitis. 2. Splenomegaly and retroperitoneal lymphadenopathy, not appreciably changed. Findings could be reactive and related to underlying infection or due to lymphoproliferative disorder.  3. Small volume ascites within the lower pelvis, increased in volume since prior study. Electronically Signed   By: Randa Ngo M.D.   On:  01/11/2021 21:43   CT ABDOMEN PELVIS W CONTRAST  Result Date: 01/07/2021 CLINICAL DATA:  20 year old with abdominal pain. Suspect pyelonephritis. EXAM: CT ABDOMEN AND PELVIS WITH CONTRAST TECHNIQUE: Multidetector CT imaging of the abdomen and pelvis was performed using the standard protocol following bolus administration of intravenous contrast. CONTRAST:  153mL OMNIPAQUE IOHEXOL 300 MG/ML  SOLN COMPARISON:  CT renal stone protocol 01/04/2021 and pelvic ultrasound 01/04/2021 FINDINGS: Lower chest: Lung bases are clear.  No pleural effusions. Hepatobiliary: Normal appearance of the liver, gallbladder and portal venous system. No biliary dilatation Pancreas: Unremarkable. No pancreatic ductal dilatation or surrounding inflammatory changes. Spleen: Spleen is prominent for size measuring 12.7 cm in the AP dimension. Adrenals/Urinary Tract: Normal adrenal glands. Normal appearance of both kidneys without hydronephrosis or perinephric edema. Normal appearance of the urinary bladder. Stomach/Bowel: Stomach is within normal limits. Appendix appears normal. No evidence of bowel wall thickening, distention, or inflammatory changes. Vascular/Lymphatic: Normal appearance of the abdominal aorta. Normal appearance of the venous structures. There is prominent periaortic soft tissue particularly along the left side of the aorta. Soft tissue measures up to 1.8 cm on sequence 2 image 35. There was a similar finding on the recent comparison CT. Findings are compatible with enlarged lymph nodes in this area. Small lymph nodes along the anterior aspect of the distal IVC. No significant pelvic lymph node enlargement. Symmetric lymph nodes along the pelvic sidewall measure 0.7 cm on sequence 2, image 68. Reproductive: 2.0 cm low-density structure in the left adnexa could represent a prominent  follicle and compatible with the recent ultrasound findings. Normal appearance of the uterus. Other: Small amount of free fluid in the pelvis. Musculoskeletal: No acute bone abnormality. IMPRESSION: 1. No acute abnormality in the abdomen or pelvis. Specifically, no evidence for hydronephrosis or pyelonephritis. 2. Slightly prominent retroperitoneal lymph nodes particularly along the left side of the aorta. Spleen is also prominent for size. Findings are nonspecific but could be related to a lymphoproliferative process or infectious etiology such as mononucleosis. 3. Small amount of free fluid in the pelvis is likely physiologic. Electronically Signed   By: Markus Daft M.D.   On: 01/07/2021 09:56   NM Hepato W/EF  Result Date: 01/11/2021 CLINICAL DATA:  Abdominal pain EXAM: NUCLEAR MEDICINE HEPATOBILIARY IMAGING WITH GALLBLADDER EF TECHNIQUE: Sequential images of the abdomen were obtained out to 60 minutes following intravenous administration of radiopharmaceutical. After oral ingestion of Ensure, gallbladder ejection fraction was determined. At 60 min, normal ejection fraction is greater than 33%. RADIOPHARMACEUTICALS:  5.28 mCi Tc-30m  Choletec IV COMPARISON:  Ultrasound Jan 10, 2021 FINDINGS: There is slightly delayed but uniform radiotracer uptake by the liver with delayed hepatic radiotracer clearance. Normal filling of the intrahepatic ducts, common bile duct. Gallbladder activity is visualized, consistent with patency of cystic duct (normal < 60 minutes). Additionally there is normal biliary to bowel transit (normal < 60 minutes), consistent with patent common bile duct. Ensure was administered and the gallbladder appears to empty normally on sequential images. Calculated gallbladder ejection fraction is 29%. (Normal gallbladder ejection fraction with Ensure is greater than 33%.) No evidence of enterogastric biliary reflux. IMPRESSION: Slightly delayed hepatic radiotracer uptake with delayed hepatic clearance  of radiotracer, suggestive of hepatocellular dysfunction/hepatitis. In the setting of hepatocellular dysfunction the evaluation of cholecystitis by HIDA is somewhat limited. Within this context there is appropriate filling of the gallbladder before 60 minutes indicating cystic duct patency excluding acute cholecystitis. However, there is a slightly reduced calculated gallbladder ejection fraction, which is  at least in part artifactual given the persistent background hepatic counts projecting in the gallbladder fossa and delayed excretion of radiotracer into the biliary system, as such this study is equivocal in evaluation for chronic cholecystitis/biliary dyskinesia. Electronically Signed   By: Dahlia Bailiff MD   On: 01/11/2021 15:47   US Abdomen Limited  Result Date: 01/09/2021 CLINICAL DATA:  Left upper quadrant pain EXAM: ULTRASOUND SPLEEN COMPARISON:  None. FINDINGS: Spleen measures 13.3 x 8.1 x 11.9 cm with a measures splenic volume of 670 cubic cm. No focal splenic lesions are evident. No perisplenic fluid or adenopathy. IMPRESSION: Prominent spleen without focal splenic lesion evident. Electronically Signed   By: Lowella Grip III M.D.   On: 01/09/2021 07:56   [place] Art/Ven Flow Abd Pelv Doppler  Result Date: 01/04/2021 CLINICAL DATA:  Initial evaluation for acute left pelvic pain, evaluate for torsion. EXAM: TRANSABDOMINAL ULTRASOUND OF PELVIS DOPPLER ULTRASOUND OF OVARIES TECHNIQUE: Transabdominal ultrasound examination of the pelvis was performed including evaluation of the uterus, ovaries, adnexal regions, and pelvic cul-de-sac. Color and duplex Doppler ultrasound was utilized to evaluate blood flow to the ovaries. COMPARISON:  Prior CT from earlier the same day. FINDINGS: Uterus Measurements: 6.1 x 2.9 x 4.0 cm = volume: 37.7 mL. Uterus is retroverted. No discrete fibroid or other mass. Endometrium Thickness: 5.0 mm.  No focal abnormality visualized. Right ovary Measurements: 3.1 x 1.5 x 2.2 cm  = volume: 5.4 mL. Normal appearance/no adnexal mass. Left ovary Measurements: 3.4 x 2.2 x 2.7 cm = volume: 11.3 mL. Normal appearance/no adnexal mass. 1.8 cm dominant follicle noted. Pulsed Doppler evaluation demonstrates normal low-resistance arterial and venous waveforms in both ovaries. Other: Trace free fluid within the pelvis, likely physiologic. IMPRESSION: 1. 1.8 cm left ovarian dominant follicle, which could contribute to left-sided pelvic pain. Associated trace free fluid within the pelvis. 2. Otherwise unremarkable and normal pelvic ultrasound. No evidence for ovarian torsion. Electronically Signed   By: Jeannine Boga M.D.   On: 01/04/2021 19:58   ECHOCARDIOGRAM COMPLETE  Result Date: 01/10/2021    ECHOCARDIOGRAM REPORT   Patient Name:   [facility] Date of Exam: 01/10/2021 Medical Rec #:  [id number]     Height:       62.0 in Accession #:    [id number]    Weight:       135.0 lb Date of Birth:  04-19-2001     BSA:          1.618 m Patient Age:    20 years      BP:           104/84 mmHg Patient Gender: F             HR:           110 bpm. Exam Location:  Inpatient Procedure: 2D Echo, Cardiac Doppler and Color Doppler Indications:    Fever R50.9  History:        Patient has no prior history of Echocardiogram examinations.  Sonographer:    Jonelle Sidle Dance Referring Phys: [id number] CORNELIUS N VAN DAM  Sonographer Comments: [place] Cardiologist notified. IMPRESSIONS  1. Left ventricular ejection fraction, by estimation, is 60 to 65%. The left ventricle has normal function. The left ventricle has no regional wall motion abnormalities. Left ventricular diastolic parameters were normal.  2. Right ventricular systolic function is normal. The right ventricular size is normal.  3. The mitral valve is normal in structure. No evidence of mitral valve regurgitation. No evidence of mitral  stenosis.  4. Possible vegetation on tricuspid valve, 1.2 x 0.4cm mobile, septal leaflet. Consider TEE for further clarification.  The tricuspid valve is abnormal.  5. The aortic valve is normal in structure. Aortic valve regurgitation is not visualized. No aortic stenosis is present.  6. The inferior vena cava is normal in size with greater than 50% respiratory variability, suggesting right atrial pressure of 3 mmHg. FINDINGS  Left Ventricle: Left ventricular ejection fraction, by estimation, is 60 to 65%. The left ventricle has normal function. The left ventricle has no regional wall motion abnormalities. The left ventricular internal cavity size was normal in size. There is  no left ventricular hypertrophy. Left ventricular diastolic parameters were normal. Right Ventricle: The right ventricular size is normal. No increase in right ventricular wall thickness. Right ventricular systolic function is normal. Left Atrium: Left atrial size was normal in size. Right Atrium: Right atrial size was normal in size. Pericardium: There is no evidence of pericardial effusion. Mitral Valve: The mitral valve is normal in structure. No evidence of mitral valve regurgitation. No evidence of mitral valve stenosis. Tricuspid Valve: Possible vegetation on tricuspid valve, 1.2 x 0.4cm mobile, septal leaflet. Consider TEE for further clarification. The tricuspid valve is abnormal. Tricuspid valve regurgitation is not demonstrated. No evidence of tricuspid stenosis. Aortic Valve: The aortic valve is normal in structure. Aortic valve regurgitation is not visualized. No aortic stenosis is present. Pulmonic Valve: The pulmonic valve was normal in structure. Pulmonic valve regurgitation is not visualized. No evidence of pulmonic stenosis. Aorta: The aortic root is normal in size and structure. Venous: The inferior vena cava is normal in size with greater than 50% respiratory variability, suggesting right atrial pressure of 3 mmHg. IAS/Shunts: No atrial level shunt detected by color flow Doppler.  LEFT VENTRICLE PLAX 2D LVIDd:         3.60 cm  Diastology LVIDs:          2.70 cm  LV e' medial:    12.00 cm/s LV PW:         1.00 cm  LV E/e' medial:  8.8 LV IVS:        0.70 cm  LV e' lateral:   19.10 cm/s LVOT diam:     1.70 cm  LV E/e' lateral: 5.5 LV SV:         35 LV SV Index:   22 LVOT Area:     2.27 cm  RIGHT VENTRICLE             IVC RV Basal diam:  2.20 cm     IVC diam: 1.80 cm RV S prime:     12.30 cm/s TAPSE (M-mode): 2.5 cm LEFT ATRIUM             Index       RIGHT ATRIUM          Index LA diam:        3.10 cm 1.92 cm/m  RA Area:     8.27 cm LA Vol (A2C):   38.9 ml 24.05 ml/m RA Volume:   15.70 ml 9.71 ml/m LA Vol (A4C):   22.7 ml 14.03 ml/m LA Biplane Vol: 29.6 ml 18.30 ml/m  AORTIC VALVE LVOT Vmax:   109.00 cm/s LVOT Vmean:  66.600 cm/s LVOT VTI:    0.154 m  AORTA Ao Root diam: 2.40 cm Ao Asc diam:  2.40 cm MITRAL VALVE MV Area (PHT): 3.85 cm     SHUNTS MV Decel Time: 197 msec  Systemic VTI:  0.15 m MV E velocity: 106.00 cm/s  Systemic Diam: 1.70 cm MV A velocity: 76.40 cm/s MV E/A ratio:  1.39 Candee Furbish MD Electronically signed by Candee Furbish MD Signature Date/Time: 01/10/2021/10:32:01 AM    Final    CT Renal Stone Study  Result Date: 01/04/2021 CLINICAL DATA:  Two days of left-sided flank pain, kidney stone suspected EXAM: CT ABDOMEN AND PELVIS WITHOUT CONTRAST TECHNIQUE: Multidetector CT imaging of the abdomen and pelvis was performed following the standard protocol without IV contrast. COMPARISON:  None. FINDINGS: Lower chest: No acute abnormality. Normal size heart. No significant pericardial effusion/thickening. Hepatobiliary: Unremarkable noncontrast appearance of the hepatic parenchyma. Gallbladder is grossly unremarkable. No biliary ductal dilation. Pancreas: Within normal limits. Spleen: Within normal limits Adrenals/Urinary Tract: Adrenal glands are unremarkable. No renal, ureteral or bladder calculus visualized. Kidneys are normal, contour deforming lesion, or hydronephrosis. Bladder is unremarkable for degree of distension. Stomach/Bowel: Stomach  is within normal limits. Appendix is not discretely visualized however there is no pericecal inflammation. No evidence of bowel wall thickening, distention, or inflammatory changes. Vascular/Lymphatic: No significant vascular findings are present. No enlarged abdominal or pelvic lymph nodes. Reproductive: Uterus and right adnexa are unremarkable. 2.3 cm left adnexal cyst. No follow-up imaging recommended. Note: This recommendation does not apply to premenarchal patients and to those with increased risk (genetic, family history, elevated tumor markers or other high-risk factors) of ovarian cancer. Reference: JACR 2020 Feb; 17(2):248-254 Other: Trace pelvic free fluid, likely physiologic. Musculoskeletal: No acute or significant osseous findings. IMPRESSION: No acute abdominopelvic findings. Specifically, no evidence of obstructive uropathy. Electronically Signed   By: Dahlia Bailiff MD   On: 01/04/2021 17:29   US Abdomen Limited RUQ (LIVER/GB)  Result Date: 01/10/2021 CLINICAL DATA:  Elevated liver enzymes with epigastric region pain EXAM: ULTRASOUND ABDOMEN LIMITED RIGHT UPPER QUADRANT COMPARISON:  CT abdomen and pelvis January 07, 2021 FINDINGS: Gallbladder: No evident gallstones. Gallbladder wall appears thickened. No pericholecystic fluid no sonographic Murphy sign noted by sonographer. Common bile duct: Diameter: 5 mm. No intrahepatic or extrahepatic biliary duct dilatation. Liver: No focal lesion identified. Within normal limits in parenchymal echogenicity. Portal vein is patent on color Doppler imaging with normal direction of blood flow towards the liver. Other: None. IMPRESSION: Thickening of the gallbladder wall noted. No gallstones evident. Question a degree of acalculus cholecystitis. This finding may warrant nuclear medicine hepatobiliary imaging study to assess cystic duct patency. Study otherwise unremarkable. Electronically Signed   By: Lowella Grip III M.D.   On: 01/10/2021 14:10     ASSESSMENT AND PLAN: 1.  Lymphadenopathy 2.  Splenomegaly 3.  Fever of unknown origin 4.  Rash 5.  Abdominal pain 6.  Acute liver injury  -The patient has persistent lymphadenopathy noted on CT scan performed yesterday.  She still has intermittent fevers.  Lymphadenopathy may be reactive but cannot rule out lymphoproliferative disorder.  There is concern for DRESS which may be the unifying diagnosis and explain her symptoms.  Holding off on bone marrow biopsy at this time. -The patient will have a TEE performed later today. -ID following and assisting with work-up for cause of fever. -Her hemoglobin and platelets remain normal.  She has mild leukocytosis likely due to steroid use.   LOS: 3 days   Mikey Bussing, DNP, AGPCNP-BC, AOCNP 01/12/21   ADDENDUM: I agree with the above assessment by Erasmo Downer.  This certainly does not appear to be a hematologic issue.  I do not believe that we are looking  at an issue with respect to a lymphoproliferative process.  She may have a DRESS.  I must say I cannot remember the last time that I saw this.  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  Her LFTs are going up.  Her hemoglobin is stable.  Her prothrombin time was a bit elevated.  It is interesting that she may be developing gallbladder issues.  She had a CT scan of the abdomen and pelvis yesterday.  This showed progressive gallbladder wall thickening.  She has stable splenomegaly and retroperitoneal adenopathy.  I still think we can hold on a bone marrow biopsy.  It is hard to say how long all of this might continue.  I just feel bad for her.  I know she is getting great care from all the staff on [address].  Lattie Haw, MD

## 2021-01-12 NOTE — Progress Notes (Addendum)
PROGRESS NOTE    Alexandra Young  ZOX:[id number] DOB: 11/30/2000 DOA: 01/07/2021 PCP: Aretha Parrot, NP    Chief Complaint  Patient presents with  . Pelvic Pain  . Fever    Brief Narrative:  Alexandra Young is Alexandra Young 20 y.o. female with Alexandra Young history of IBS and anxiety. She presented secondary to left flank pain. She was found to have significant fevers and non-specific findings on CT scan including Alexandra Young prominent spleen and retroperitoneal lymph nodes.  She's continued to have intermittent fevers and abdominal pain with unclear etiology.   Assessment & Plan:   Principal Problem:   Fever of unknown origin Active Problems:   Intractable nausea and vomiting   Intractable pain   Pelvic pain   Splenomegaly   Elevated LFTs   Epigastric pain   Left flank pain   Rash   Endocarditis of tricuspid valve   Transaminitis  Concern for DRESS?  Diffuse Maculopapular Rash Acute Liver Injury  Eosinophilia    Possible unifying diagnosis Started on lamictal about 1.5 months ago during psychiatric hospitalization Lamictal is high risk drug for dress Pathologist smear review pending Absolute eosinophils 1.2 today LFT's continuing to rise -> normal INR, normal platelets.  Bili 1.4, rising.  Albumin 3.  No signs of encephalopathy. Consider punch biopsy Stop lamictal, follow.  With worsening rash, eosinophilia, LFTs, will start oral steroids.  Follow response.    Fever of unknown origin Recurrent fever 5/4 PM Unrevealing workup so far -> DRESS seems to be unifying diagnosis HIDA scan today suggestive of hepatocellular dysfunction, hepatitis -> equivocal study in evaluation for chronic cholecystitis/biliary dyskinesia RUQ [place] with gallbladder thickenign CT chest with trace R effusion CT abdomen/pelvis without acute abnormality Prominent retroperitoneal LN along L side of aorta Pelvic US with 1.8 cm L ovarian dominant follicle, no evidence torsion Parvovirus B19 IgG positive Ehrlichia ab panel  negative RMSF studies negative ANA negative RF negative RPR negative EBV ab panel suggestive of past infection CMV IgG positive, negative IgM Acute hepatitis panel negative Negative HIV RNA quant Histoplasma antigen pending collection Echo with possible vegetation on tricuspid valve -> follow TEE (pending) Repeat CT abdomen pelvis 5/4 with gallbladder wall thickening and pericholecystic fluid with development of periportal edema (acalculous cholecystitis, hepatic dysfunction, or hepatitis), spenomegaly and retroperitoneal LAD, small volume ascites  Abdominal Pain Follow repeat CT scan today as above Follow repeat lipase, mildly elevated Pain management  Prominent Spleen Retroperitoneal Lymphadenopathy Heme onc was considering bone marrow bx, with alternative diagnoses above, I think reasonable to continue to hold off on this  SPEP without monoclonal protein IgM, IgA, IgG wnl Beta 2 microglobulin wnl  Anxiety  Depression Holding lamictal with above Buspar As holding lamictal, will c/s psych to discuss alternative meds  IBS   Allergies Singulair, claritin  Hypokalemia Replace and follow  DVT prophylaxis: lovenox Code Status: full  Family Communication: none at bedside - mother over phone Disposition:   Status is: Inpatient  Remains inpatient appropriate because:Inpatient level of care appropriate due to severity of illness   Dispo: The patient is from: Home              Anticipated d/c is to: Home              Patient currently is not medically stable to d/c.   Difficult to place patient No   Consultants:   ID  Cardiology  Procedures Echo IMPRESSIONS    1. Left ventricular ejection fraction, by estimation, is 60 to 65%. The  left ventricle has normal function. The left ventricle has no regional  wall motion abnormalities. Left ventricular diastolic parameters were  normal.  2. Right ventricular systolic function is normal. The right ventricular   size is normal.  3. The mitral valve is normal in structure. No evidence of mitral valve  regurgitation. No evidence of mitral stenosis.  4. Possible vegetation on tricuspid valve, 1.2 x 0.4cm mobile, septal  leaflet. Consider TEE for further clarification. The tricuspid valve is  abnormal.  5. The aortic valve is normal in structure. Aortic valve regurgitation is  not visualized. No aortic stenosis is present.  6. The inferior vena cava is normal in size with greater than 50%  respiratory variability, suggesting right atrial pressure of 3 mmHg.  Antimicrobials:  Anti-infectives (From admission, onward)   Start     Dose/Rate Route Frequency Ordered Stop   01/10/21 1645  fluconazole (DIFLUCAN) tablet 150 mg        150 mg Oral  Once 01/10/21 1554 01/10/21 1641   01/07/21 0715  cefTRIAXone (ROCEPHIN) 1 g in sodium chloride 0.9 % 100 mL IVPB        1 g 200 mL/hr over 30 Minutes Intravenous  Once 01/07/21 0710 01/07/21 0751     Subjective: Abdominal discomfort improved with pain meds   Objective: Vitals:   01/11/21 1852 01/11/21 2246 01/12/21 0252 01/12/21 0653  BP: 109/72 107/66 108/67 105/70  Pulse: (!) 117 94 93 (!) 104  Resp: Temp: 99.2 F (37.3 C) (!) 102.5 F (39.2 C) 98.8 F (37.1 C) 99.9 F (37.7 C)  TempSrc: Oral Oral Oral Oral  SpO2: 98% 98% 99% 100%  Weight:      Height:        Intake/Output Summary (Last 24 hours) at 01/12/2021 1015 Last data filed at 01/12/2021 0900 Gross per 24 hour  Intake 1003.68 ml  Output --  Net 1003.68 ml   Filed Weights   01/07/21 0600  Weight: 61.2 kg    Examination:  General exam: Appears calm and comfortable  Respiratory system: Clear to auscultation. Respiratory effort normal. Cardiovascular system: S1 & S2 heard, RRR.  Gastrointestinal system: L sided abdominal discomfort Central nervous system: Alert and oriented. No focal neurological deficits. Extremities: no LEE Skin: diffuse morbilliform  erythematous rash - most notable on back, abdomen, arms - developing papular rash to legs - no oral involvement - she does have facial edema Psychiatry: Judgement and insight appear normal. Mood & affect appropriate.     Data Reviewed: I have personally reviewed following labs and imaging studies  CBC: Recent Labs  Lab 01/08/21 0221 01/09/21 0311 01/10/21 0257 01/11/21 0424 01/12/21 0452  WBC 6.3 9.7 10.3 12.6* 13.9*  NEUTROABS  --   --  2.7  --  4.2  HGB 12.5 11.9* 12.2 11.7* 12.5  HCT 38.3 36.7 37.0 34.5* 36.7  MCV 87.6 86.6 84.1 84.1 84.0  PLT 156 170 173 154 203    Basic Metabolic Panel: Recent Labs  Lab 01/08/21 0221 01/09/21 0311 01/10/21 0257 01/11/21 0424 01/12/21 0452  NA 138 135 135 139 137  K 3.3* 3.6 3.6 4.0 3.2*  CL 108 111 110 108 105  CO2 20* 12* 16* 20* 23  GLUCOSE 83 71 120* 119* 105*  BUN <5* <5* <5* <5* <5*  CREATININE 0.62 0.71 0.56 0.42* 0.51  CALCIUM 8.3* 8.3* 8.3* 8.0* 8.1*  MG  --   --   --   --  2.2  PHOS  --   --   --   --  2.9    GFR: Estimated Creatinine Clearance: 95.7 mL/min (by C-G formula based on SCr of 0.51 mg/dL).  Liver Function Tests: Recent Labs  Lab 01/08/21 0221 01/09/21 0311 01/10/21 0257 01/11/21 0424 01/12/21 0452  AST 39 42* 73* 124* 146*  ALT 38 48* 89* 142* 227*  ALKPHOS 50 83 131* 108 148*  BILITOT 0.5 1.0 0.6 1.2 1.4*  PROT 6.7 6.6 6.7 6.0* 6.2*  ALBUMIN 3.3* 3.4* 3.4* 3.1* 3.0*    CBG: No results for input(s): GLUCAP in the last 168 hours.   Recent Results (from the past 240 hour(s))  Resp Panel by RT-PCR (Flu Lyan Moyano&B, Covid) Nasopharyngeal Swab     Status: None   Collection Time: 01/04/21  4:24 PM   Specimen: Nasopharyngeal Swab; Nasopharyngeal(NP) swabs in vial transport medium  Result Value Ref Range Status   SARS Coronavirus 2 by RT PCR NEGATIVE NEGATIVE Final    Comment: (NOTE) SARS-CoV-2 target nucleic acids are NOT DETECTED.  The SARS-CoV-2 RNA is generally detectable in upper  respiratory specimens during the acute phase of infection. The lowest concentration of SARS-CoV-2 viral copies this assay can detect is 138 copies/mL. Tearah Saulsbury negative result does not preclude SARS-Cov-2 infection and should not be used as the sole basis for treatment or other patient management decisions. Semir Brill negative result may occur with  improper specimen collection/handling, submission of specimen other than nasopharyngeal swab, presence of viral mutation(s) within the areas targeted by this assay, and inadequate number of viral copies(<138 copies/mL). Bobbie Virden negative result must be combined with clinical observations, patient history, and epidemiological information. The expected result is Negative.  Fact Sheet for Patients:  BloggerCourse.comhttps://www.fda.gov/media/152166/download  Fact Sheet for Healthcare Providers:  SeriousBroker.ithttps://www.fda.gov/media/152162/download  This test is no t yet approved or cleared by the [place] FDA and  has been authorized for detection and/or diagnosis of SARS-CoV-2 by FDA under an Emergency Use Authorization (EUA). This EUA will remain  in effect (meaning this test can be used) for the duration of the COVID-19 declaration under Section 564(b)(1) of the Act, 21 U.S.C.section 360bbb-3(b)(1), unless the authorization is terminated  or revoked sooner.       Influenza Carrel Leather by PCR NEGATIVE NEGATIVE Final   Influenza B by PCR NEGATIVE NEGATIVE Final    Comment: (NOTE) The Xpert Xpress SARS-CoV-2/FLU/RSV plus assay is intended as an aid in the diagnosis of influenza from Nasopharyngeal swab specimens and should not be used as Joby Hershkowitz sole basis for treatment. Nasal washings and aspirates are unacceptable for Xpert Xpress SARS-CoV-2/FLU/RSV testing.  Fact Sheet for Patients: BloggerCourse.comhttps://www.fda.gov/media/152166/download  Fact Sheet for Healthcare Providers: SeriousBroker.ithttps://www.fda.gov/media/152162/download  This test is not yet approved or cleared by the [place] FDA and has been  authorized for detection and/or diagnosis of SARS-CoV-2 by FDA under an Emergency Use Authorization (EUA). This EUA will remain in effect (meaning this test can be used) for the duration of the COVID-19 declaration under Section 564(b)(1) of the Act, 21 U.S.C. section 360bbb-3(b)(1), unless the authorization is terminated or revoked.  Performed at [facility], [address] [place], [place] [id number]   Wet prep, genital     Status: Abnormal   Collection Time: 01/04/21  7:09 PM   Specimen: PATH Cytology Cervicovaginal Ancillary Only  Result Value Ref Range Status   Yeast Wet Prep HPF POC NONE SEEN NONE SEEN Final   Trich, Wet Prep NONE SEEN NONE SEEN Final   Clue Cells  Wet Prep HPF POC PRESENT (Lakysha Kossman) NONE SEEN Final   WBC, Wet Prep HPF POC MANY (Whittaker Lenis) NONE SEEN Final   Sperm NONE SEEN  Final    Comment: Performed at [facility], [address] [place], [place] [address]  Urine culture     Status: Abnormal   Collection Time: 01/07/21  9:32 AM   Specimen: Urine, Catheterized  Result Value Ref Range Status   Specimen Description   Final    URINE, CATHETERIZED Performed at [facility], [address], [place], [place] [address]    Special Requests   Final    NONE Performed at [facility], [address], [place], [place] [address]    Culture (Tenicia Gural)  Final    <10,000 COLONIES/mL INSIGNIFICANT GROWTH Performed at [facility], [address] [place], [place] [address]    Report Status 01/08/2021 FINAL  Final  Culture, blood (routine x 2)     Status: None   Collection Time: 01/07/21  1:39 PM   Specimen: BLOOD RIGHT HAND  Result Value Ref Range Status   Specimen Description   Final    BLOOD RIGHT HAND Performed at [facility], [address] [place], [place] [address]    Special Requests   Final    BOTTLES DRAWN AEROBIC ONLY Blood Culture adequate volume Performed at [facility], [address], [place], [place] [address]    Culture   Final    NO GROWTH 5 DAYS Performed at [facility], [address], [place], [place] [address]    Report Status 01/12/2021 FINAL  Final  Culture, blood (routine x 2)     Status: None   Collection Time: 01/07/21  1:39 PM   Specimen: BLOOD LEFT HAND  Result Value Ref Range Status   Specimen Description   Final    BLOOD LEFT HAND Performed at [facility] [address], [place], [place] [address]    Special Requests   Final    BOTTLES DRAWN AEROBIC ONLY Blood Culture results may not be optimal due to an inadequate volume of blood received in culture bottles Performed at [facility], [address] [place], [place] [address]    Culture   Final    NO GROWTH 5 DAYS Performed at [facility], [address], [place], [place] [address]    Report Status 01/12/2021 FINAL  Final  Blastomyces Antigen     Status: None   Collection Time: 01/07/21  6:45 PM  Result Value Ref Range Status   Blastomyces Antigen None Detected None Detected ng/mL Final    Comment: (NOTE) Results reported as ng/mL in 0.2 - 14.7 ng/mL range Results above the limit of detection but below 0.2 ng/mL are reported as 'Positive, Below the Limit of Quantification' Results above 14.7 ng/mL are reported as 'Positive, Above the Limit of Quantification'    Specimen Type SERUM  Final    Comment: (NOTE) Performed At: [facility] [address] [place], [place] [id number] Phylis Bougie MD QI:[id number]          Radiology Studies: CT ABDOMEN PELVIS W CONTRAST  Result Date: 01/11/2021 CLINICAL DATA:  Abdominal pain, fever, rash EXAM: CT ABDOMEN AND PELVIS WITH CONTRAST TECHNIQUE: Multidetector CT imaging of the abdomen and pelvis was performed using the standard protocol following bolus administration of intravenous contrast. CONTRAST:  49mL OMNIPAQUE IOHEXOL 300 MG/ML  SOLN  COMPARISON:  01/07/2021 FINDINGS: Lower chest: No acute pleural or parenchymal lung disease. Hepatobiliary: Interval progression of the gallbladder wall thickening and pericholecystic fluid seen on prior ultrasound. Additionally, periportal edema has developed. No focal liver parenchymal abnormalities. No biliary dilation. Pancreas: Unremarkable. No pancreatic ductal dilatation or surrounding inflammatory changes. Spleen: Stable splenomegaly without focal parenchymal abnormality. Adrenals/Urinary Tract: Adrenal glands are unremarkable. Kidneys are normal, without renal calculi, focal lesion, or hydronephrosis. Bladder is unremarkable. Stomach/Bowel: No bowel obstruction or ileus. Normal appendix right lower quadrant. No bowel wall thickening or inflammatory change. Vascular/Lymphatic: No significant vascular findings are present. Stable retroperitoneal lymphadenopathy, largest lymph node in the left para-aortic region measuring up to 21 x 11 mm. Stable borderline enlarged bilateral inguinal lymph nodes. Reproductive: Uterus and bilateral adnexa are unremarkable. Other: There is Cledis Sohn small amount of ascites within the lower pelvis, increased in volume since prior study. No free intraperitoneal gas. No abdominal wall hernia. Musculoskeletal: No acute or destructive bony lesions. Reconstructed images demonstrate no additional findings. IMPRESSION: 1. Progressive gallbladder wall thickening and pericholecystic fluid, with development of periportal edema. Findings could reflect acalculous cholecystitis, progressive hepatic dysfunction, or hepatitis. 2. Splenomegaly and retroperitoneal lymphadenopathy, not appreciably changed. Findings could be reactive and related to underlying infection or due to lymphoproliferative disorder. 3. Small volume ascites within the lower pelvis, increased in volume since prior study. Electronically Signed   By: Sharlet Salina M.D.   On: 01/11/2021 21:43   NM Hepato W/EF  Result Date:  01/11/2021 CLINICAL DATA:  Abdominal pain EXAM: NUCLEAR MEDICINE HEPATOBILIARY IMAGING WITH GALLBLADDER EF TECHNIQUE: Sequential images of the abdomen were obtained out to 60 minutes following intravenous administration of radiopharmaceutical. After oral ingestion of Ensure, gallbladder ejection fraction was determined. At 60 min, normal ejection fraction is greater than 33%. RADIOPHARMACEUTICALS:  5.28 mCi Tc-69m  Choletec IV COMPARISON:  Ultrasound Jan 10, 2021 FINDINGS: There is slightly delayed but uniform radiotracer uptake by the liver with delayed hepatic radiotracer clearance. Normal filling of the intrahepatic ducts, common bile duct. Gallbladder activity is visualized, consistent with patency of cystic duct (normal < 60 minutes). Additionally there is normal biliary to bowel transit (normal < 60 minutes), consistent with patent common bile duct. Ensure was administered and the gallbladder appears to empty normally on sequential images. Calculated gallbladder ejection fraction is 29%. (Normal gallbladder ejection fraction with Ensure is greater than 33%.) No evidence of enterogastric biliary reflux. IMPRESSION: Slightly delayed hepatic radiotracer uptake with delayed hepatic clearance of radiotracer, suggestive of hepatocellular dysfunction/hepatitis. In the setting of hepatocellular dysfunction the evaluation of cholecystitis by HIDA is somewhat limited. Within this context there is appropriate filling of the gallbladder before 60 minutes indicating cystic duct patency excluding acute cholecystitis. However, there is Vada Yellen slightly reduced calculated gallbladder ejection fraction, which is at least in part artifactual given the persistent background hepatic counts projecting in the gallbladder fossa and delayed excretion of radiotracer into the biliary system, as such this study is equivocal in evaluation for chronic cholecystitis/biliary dyskinesia. Electronically Signed   By: Maudry Mayhew MD   On: 01/11/2021  15:47   US Abdomen Limited RUQ (LIVER/GB)  Result Date: 01/10/2021 CLINICAL DATA:  Elevated liver enzymes with epigastric region pain EXAM: ULTRASOUND ABDOMEN LIMITED RIGHT UPPER QUADRANT COMPARISON:  CT abdomen and pelvis January 07, 2021 FINDINGS: Gallbladder: No evident gallstones. Gallbladder wall appears thickened. No pericholecystic fluid no sonographic Murphy sign noted by sonographer. Common bile duct: Diameter: 5 mm. No intrahepatic or extrahepatic biliary duct dilatation. Liver: No  focal lesion identified. Within normal limits in parenchymal echogenicity. Portal vein is patent on color Doppler imaging with normal direction of blood flow towards the liver. Other: None. IMPRESSION: Thickening of the gallbladder wall noted. No gallstones evident. Question Azari Hasler degree of acalculus cholecystitis. This finding may warrant nuclear medicine hepatobiliary imaging study to assess cystic duct patency. Study otherwise unremarkable. Electronically Signed   By: Bretta Bang III M.D.   On: 01/10/2021 14:10        Scheduled Meds: . busPIRone  10 mg Oral BID  . enoxaparin (LOVENOX) injection  40 mg Subcutaneous Q24H  . loratadine  10 mg Oral Daily  . montelukast  10 mg Oral QHS  . ondansetron (ZOFRAN) IV  4 mg Intravenous Q8H   Or  . ondansetron  4 mg Oral Q8H  . predniSONE  60 mg Oral Q breakfast  . sodium chloride flush  10-40 mL Intracatheter Q12H  . triamcinolone cream   Topical BID   Continuous Infusions: . sodium chloride 20 mL/hr at 01/11/21 2042  . lactated ringers with kcl    . potassium chloride       LOS: 3 days    Time spent: over 30 min    Lacretia Nicks, MD [facility]   To contact the attending provider between 7A-7P or the covering provider during after hours 7P-7A, please log into the web site [email or web address] and access using universal [place] password for that web site. If you do not have the password, please call the hospital operator.  01/12/2021, 10:15  AM

## 2021-01-12 NOTE — CV Procedure (Signed)
   Transesophageal Echocardiogram  Indications: Bacteremia  Time out performed  During this procedure the patient is administered a total of Versed 4 mg and Fentanyl 50 mcg to achieve and maintain moderate conscious sedation.  The patient's heart rate, blood pressure, and oxygen saturation are monitored continuously during the procedure. The period of conscious sedation is 10 minutes, of which I was present face-to-face 100% of this time.  Findings:  Left Ventricle: Normal EF 65%  Mitral Valve: Normal  Aortic Valve: Normal  Tricuspid Valve: Normal  Left Atrium: Normal, no thrombus  IMPRESSION:   - No evidence of tricuspid valve vegetation. No endocarditis. Negative study.   Donato Schultz, MD

## 2021-01-12 NOTE — H&P (View-Only) (Signed)
      Subjective:  Rash worse  Antibiotics:  Anti-infectives (From admission, onward)   Start     Dose/Rate Route Frequency Ordered Stop   01/10/21 1645  fluconazole (DIFLUCAN) tablet 150 mg        150 mg Oral  Once 01/10/21 1554 01/10/21 1641   01/07/21 0715  cefTRIAXone (ROCEPHIN) 1 g in sodium chloride 0.9 % 100 mL IVPB        1 g 200 mL/hr over 30 Minutes Intravenous  Once 01/07/21 0710 01/07/21 0751      Medications: Scheduled Meds: . ARIPiprazole  2 mg Oral Once  . [START ON 01/13/2021] ARIPiprazole  5 mg Oral Daily  . busPIRone  10 mg Oral BID  . enoxaparin (LOVENOX) injection  40 mg Subcutaneous Q24H  . loratadine  10 mg Oral Daily  . montelukast  10 mg Oral QHS  . ondansetron (ZOFRAN) IV  4 mg Intravenous Q8H   Or  . ondansetron  4 mg Oral Q8H  . predniSONE  60 mg Oral Q breakfast  . sodium chloride flush  10-40 mL Intracatheter Q12H  . triamcinolone cream   Topical BID   Continuous Infusions: . sodium chloride 20 mL/hr at 01/11/21 2042  . lactated ringers with kcl 75 mL/hr at 01/12/21 1032   PRN Meds:.albuterol, alum & mag hydroxide-simeth, diphenhydrAMINE, fluticasone, linaclotide, loperamide, morphine injection, oxyCODONE, prochlorperazine, sodium chloride flush, traZODone    Objective: Weight change:   Intake/Output Summary (Last 24 hours) at 01/12/2021 1404 Last data filed at 01/12/2021 0900 Gross per 24 hour  Intake 1003.68 ml  Output --  Net 1003.68 ml   Blood pressure 114/86, pulse 94, temperature 98.6 F (37 C), temperature source Oral, resp. rate 16, height 5' 2" (1.575 m), weight 61.2 kg, last menstrual period 01/06/2021, SpO2 99 %. Temp:  [97.7 F (36.5 C)-102.5 F (39.2 C)] 98.6 F (37 C) (05/05 1328) Pulse Rate:  [93-124] 94 (05/05 1328) Resp:  [16-20] 16 (05/05 1328) BP: (103-126)/(62-88) 114/86 (05/05 1328) SpO2:  [98 %-100 %] 99 % (05/05 1328)  Physical Exam: Physical Exam Constitutional:      General: She is not in acute  distress.    Appearance: She is well-developed. She is not diaphoretic.  HENT:     Head: Normocephalic and atraumatic.     Right Ear: External ear normal.     Left Ear: External ear normal.     Mouth/Throat:     Pharynx: No oropharyngeal exudate.  Eyes:     General: No scleral icterus.    Conjunctiva/sclera: Conjunctivae normal.     Pupils: Pupils are equal, round, and reactive to light.  Cardiovascular:     Rate and Rhythm: Normal rate and regular rhythm.     Heart sounds: Normal heart sounds. No murmur heard. No friction rub. No gallop.   Pulmonary:     Effort: Pulmonary effort is normal. No respiratory distress.     Breath sounds: Normal breath sounds. No wheezing or rales.  Abdominal:     General: Bowel sounds are normal.     Palpations: Abdomen is soft.     Tenderness: There is abdominal tenderness in the epigastric area, periumbilical area, left upper quadrant and left lower quadrant.  Musculoskeletal:        General: No tenderness. Normal range of motion.  Lymphadenopathy:     Cervical: No cervical adenopathy.  Skin:    General: Skin is warm and dry.     Coloration: Skin is   not pale.     Findings: Rash present. No erythema.  Neurological:     Mental Status: She is alert and oriented to person, place, and time.     Motor: No abnormal muscle tone.     Coordination: Coordination normal.  Psychiatric:        Attention and Perception: Attention normal.        Mood and Affect: Affect is tearful.        Speech: Speech normal.        Behavior: Behavior normal. Behavior is cooperative.        Thought Content: Thought content normal.        Cognition and Memory: Cognition and memory normal.        Judgment: Judgment normal.     Rash  01/09/2021:       01/10/2021:        Tongue  01/10/2021 (she says she also did burn her tongue w soup)    01/12/2021:       01/12/2021:            CBC:    BMET Recent Labs    01/11/21 0424 01/12/21 0452  NA  139 137  K 4.0 3.2*  CL 108 105  CO2 20* 23  GLUCOSE 119* 105*  BUN <5* <5*  CREATININE 0.42* 0.51  CALCIUM 8.0* 8.1*     Liver Panel  Recent Labs    01/11/21 0424 01/12/21 0452  PROT 6.0* 6.2*  ALBUMIN 3.1* 3.0*  AST 124* 146*  ALT 142* 227*  ALKPHOS 108 148*  BILITOT 1.2 1.4*       Sedimentation Rate No results for input(s): ESRSEDRATE in the last 72 hours. C-Reactive Protein No results for input(s): CRP in the last 72 hours.  Micro Results: Recent Results (from the past 720 hour(s))  Resp Panel by RT-PCR (Flu A&B, Covid) Nasopharyngeal Swab     Status: None   Collection Time: 01/04/21  4:24 PM   Specimen: Nasopharyngeal Swab; Nasopharyngeal(NP) swabs in vial transport medium  Result Value Ref Range Status   SARS Coronavirus 2 by RT PCR NEGATIVE NEGATIVE Final    Comment: (NOTE) SARS-CoV-2 target nucleic acids are NOT DETECTED.  The SARS-CoV-2 RNA is generally detectable in upper respiratory specimens during the acute phase of infection. The lowest concentration of SARS-CoV-2 viral copies this assay can detect is 138 copies/mL. A negative result does not preclude SARS-Cov-2 infection and should not be used as the sole basis for treatment or other patient management decisions. A negative result may occur with  improper specimen collection/handling, submission of specimen other than nasopharyngeal swab, presence of viral mutation(s) within the areas targeted by this assay, and inadequate number of viral copies(<138 copies/mL). A negative result must be combined with clinical observations, patient history, and epidemiological information. The expected result is Negative.  Fact Sheet for Patients:  BloggerCourse.com  Fact Sheet for Healthcare Providers:  SeriousBroker.it  This test is no t yet approved or cleared by the [place] FDA and  has been authorized for detection and/or diagnosis of SARS-CoV-2  by FDA under an Emergency Use Authorization (EUA). This EUA will remain  in effect (meaning this test can be used) for the duration of the COVID-19 declaration under Section 564(b)(1) of the Act, 21 U.S.C.section 360bbb-3(b)(1), unless the authorization is terminated  or revoked sooner.       Influenza A by PCR NEGATIVE NEGATIVE Final   Influenza B by PCR NEGATIVE NEGATIVE Final  Comment: (NOTE) The Xpert Xpress SARS-CoV-2/FLU/RSV plus assay is intended as an aid in the diagnosis of influenza from Nasopharyngeal swab specimens and should not be used as a sole basis for treatment. Nasal washings and aspirates are unacceptable for Xpert Xpress SARS-CoV-2/FLU/RSV testing.  Fact Sheet for Patients: BloggerCourse.com  Fact Sheet for Healthcare Providers: SeriousBroker.it  This test is not yet approved or cleared by the [place] FDA and has been authorized for detection and/or diagnosis of SARS-CoV-2 by FDA under an Emergency Use Authorization (EUA). This EUA will remain in effect (meaning this test can be used) for the duration of the COVID-19 declaration under Section 564(b)(1) of the Act, 21 U.S.C. section 360bbb-3(b)(1), unless the authorization is terminated or revoked.  Performed at [facility], [address], [place], [place] [address]   Wet prep, genital     Status: Abnormal   Collection Time: 01/04/21  7:09 PM   Specimen: PATH Cytology Cervicovaginal Ancillary Only  Result Value Ref Range Status   Yeast Wet Prep HPF POC NONE SEEN NONE SEEN Final   Trich, Wet Prep NONE SEEN NONE SEEN Final   Clue Cells Wet Prep HPF POC PRESENT (A) NONE SEEN Final   WBC, Wet Prep HPF POC MANY (A) NONE SEEN Final   Sperm NONE SEEN  Final    Comment: Performed at [facility], [address], [place], [place] [address]  Urine culture     Status: Abnormal   Collection Time: 01/07/21  9:32 AM    Specimen: Urine, Catheterized  Result Value Ref Range Status   Specimen Description   Final    URINE, CATHETERIZED Performed at [facility], [address] [place], [place] [address]    Special Requests   Final    NONE Performed at [facility], [address], [place], [place] [address]    Culture (A)  Final    <10,000 COLONIES/mL INSIGNIFICANT GROWTH Performed at [facility], [address], [place], [place] [address]    Report Status 01/08/2021 FINAL  Final  Culture, blood (routine x 2)     Status: None   Collection Time: 01/07/21  1:39 PM   Specimen: BLOOD RIGHT HAND  Result Value Ref Range Status   Specimen Description   Final    BLOOD RIGHT HAND Performed at [facility], [address] [place], [place] [address]    Special Requests   Final    BOTTLES DRAWN AEROBIC ONLY Blood Culture adequate volume Performed at [facility], [address] [place], [place] [address]    Culture   Final    NO GROWTH 5 DAYS Performed at [facility], [address], [place], [place] [address]    Report Status 01/12/2021 FINAL  Final  Culture, blood (routine x 2)     Status: None   Collection Time: 01/07/21  1:39 PM   Specimen: BLOOD LEFT HAND  Result Value Ref Range Status   Specimen Description   Final    BLOOD LEFT HAND Performed at [facility], [address], [place], [place] [address]    Special Requests   Final    BOTTLES DRAWN AEROBIC ONLY Blood Culture results may not be optimal due to an inadequate volume of blood received in culture bottles Performed at [facility], [address], [place], [place] [address]    Culture   Final    NO GROWTH  5 DAYS Performed at [facility], [address] [place], [place] [id number]    Report Status 01/12/2021 FINAL  Final  Blastomyces Antigen     Status: None   Collection Time: 01/07/21  6:45 PM   Result Value Ref Range Status   Blastomyces Antigen None Detected None Detected ng/mL Final    Comment: (NOTE) Results reported as ng/mL in 0.2 - 14.7 ng/mL range Results above the limit of detection but below 0.2 ng/mL are reported as 'Positive, Below the Limit of Quantification' Results above 14.7 ng/mL are reported as 'Positive, Above the Limit of Quantification'    Specimen Type SERUM  Final    Comment: (NOTE) Performed At: [facility] [address] [place], [place] [id number] Phylis BougieBlue Deborah E MD XB:[id number]:[phone]     Studies/Results: CT ABDOMEN PELVIS W CONTRAST  Result Date: 01/11/2021 CLINICAL DATA:  Abdominal pain, fever, rash EXAM: CT ABDOMEN AND PELVIS WITH CONTRAST TECHNIQUE: Multidetector CT imaging of the abdomen and pelvis was performed using the standard protocol following bolus administration of intravenous contrast. CONTRAST:  75mL OMNIPAQUE IOHEXOL 300 MG/ML  SOLN COMPARISON:  01/07/2021 FINDINGS: Lower chest: No acute pleural or parenchymal lung disease. Hepatobiliary: Interval progression of the gallbladder wall thickening and pericholecystic fluid seen on prior ultrasound. Additionally, periportal edema has developed. No focal liver parenchymal abnormalities. No biliary dilation. Pancreas: Unremarkable. No pancreatic ductal dilatation or surrounding inflammatory changes. Spleen: Stable splenomegaly without focal parenchymal abnormality. Adrenals/Urinary Tract: Adrenal glands are unremarkable. Kidneys are normal, without renal calculi, focal lesion, or hydronephrosis. Bladder is unremarkable. Stomach/Bowel: No bowel obstruction or ileus. Normal appendix right lower quadrant. No bowel wall thickening or inflammatory change. Vascular/Lymphatic: No significant vascular findings are present. Stable retroperitoneal lymphadenopathy, largest lymph node in the left para-aortic region measuring up to 21 x 11 mm. Stable borderline enlarged bilateral inguinal lymph nodes.  Reproductive: Uterus and bilateral adnexa are unremarkable. Other: There is a small amount of ascites within the lower pelvis, increased in volume since prior study. No free intraperitoneal gas. No abdominal wall hernia. Musculoskeletal: No acute or destructive bony lesions. Reconstructed images demonstrate no additional findings. IMPRESSION: 1. Progressive gallbladder wall thickening and pericholecystic fluid, with development of periportal edema. Findings could reflect acalculous cholecystitis, progressive hepatic dysfunction, or hepatitis. 2. Splenomegaly and retroperitoneal lymphadenopathy, not appreciably changed. Findings could be reactive and related to underlying infection or due to lymphoproliferative disorder. 3. Small volume ascites within the lower pelvis, increased in volume since prior study. Electronically Signed   By: Sharlet SalinaMichael  Brown M.D.   On: 01/11/2021 21:43   NM Hepato W/EF  Result Date: 01/11/2021 CLINICAL DATA:  Abdominal pain EXAM: NUCLEAR MEDICINE HEPATOBILIARY IMAGING WITH GALLBLADDER EF TECHNIQUE: Sequential images of the abdomen were obtained out to 60 minutes following intravenous administration of radiopharmaceutical. After oral ingestion of Ensure, gallbladder ejection fraction was determined. At 60 min, normal ejection fraction is greater than 33%. RADIOPHARMACEUTICALS:  5.28 mCi Tc-2731m  Choletec IV COMPARISON:  Ultrasound Jan 10, 2021 FINDINGS: There is slightly delayed but uniform radiotracer uptake by the liver with delayed hepatic radiotracer clearance. Normal filling of the intrahepatic ducts, common bile duct. Gallbladder activity is visualized, consistent with patency of cystic duct (normal < 60 minutes). Additionally there is normal biliary to bowel transit (normal < 60 minutes), consistent with patent common bile duct. Ensure was administered and the gallbladder appears to empty normally on sequential images. Calculated gallbladder ejection fraction is 29%. (Normal  gallbladder ejection fraction with Ensure is greater  than 33%.) No evidence of enterogastric biliary reflux. IMPRESSION: Slightly delayed hepatic radiotracer uptake with delayed hepatic clearance of radiotracer, suggestive of hepatocellular dysfunction/hepatitis. In the setting of hepatocellular dysfunction the evaluation of cholecystitis by HIDA is somewhat limited. Within this context there is appropriate filling of the gallbladder before 60 minutes indicating cystic duct patency excluding acute cholecystitis. However, there is a slightly reduced calculated gallbladder ejection fraction, which is at least in part artifactual given the persistent background hepatic counts projecting in the gallbladder fossa and delayed excretion of radiotracer into the biliary system, as such this study is equivocal in evaluation for chronic cholecystitis/biliary dyskinesia. Electronically Signed   By: Maudry Mayhew MD   On: 01/11/2021 15:47      Assessment/Plan:  INTERVAL HISTORY:  CT abdomen unremarkable  LFTS worse, eos worse, rash worse  Lamictal stopped   Principal Problem:   DRESS syndrome Active Problems:   Intractable nausea and vomiting   Intractable pain   Pelvic pain   Fever of unknown origin   Splenomegaly   Elevated LFTs   Epigastric pain   Left flank pain   Rash   Endocarditis of tricuspid valve   Transaminitis    Alexandra Young is a 20 y.o. female with admission with abdominal pain pelvic pain and high fevers now with a rash.  The scan had shown subtle splenomegaly and some retroperitoneal lymph nodes.  She also had some mild transaminitis on her liver function test.  2D echocardiogram shows possible vegetation on tricuspid valve. Lfts t continue to worsen.  HIDA scan was not capable of definitively ruling out cholecystitis.  Lipase as in the 70s yesterday  I think Dr. Lowell Guitar has made the diagnosis that has caused all of her fevers, transaminitis eosinophilia and that is DRESS  from lamictal  #1 Fever unknown origin:  Seems likely DRESS from lamictal  #2 DRESS  Lamictal has been stopped and steroids initiated  #3 possible tricuspid valve vegetation seen on 2D echocardiogram she is getting a TEE today  To get a transesophageal echocardiogram tomorrow  Trends LFTS, repeat CBC tomorrow and lipase  I would contemplate giving her empiric treatment for RMSF or early Kia but I do not feel that her clinical picture are not really feet fits very well for that.  Doxycycline would likely relatively harmless but also do not want to obfuscated her work-up.  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 Lowell Guitar. .   LOS: 3 days   Acey Lav 01/12/2021, 2:04 PM

## 2021-01-12 NOTE — Consult Note (Signed)
[facility] Face-to-Face Psychiatry Consult   Reason for Consult:  Lamictal stopped with concern for DRESS, discuss alternative therapies Referring Physician: Dr. Elijah Birk Patient Identification: Alexandra Young MRN:  [id number] Principal Diagnosis: Fever of unknown origin Diagnosis:  Principal Problem:   Fever of unknown origin Active Problems:   Intractable nausea and vomiting   Intractable pain   Pelvic pain   Splenomegaly   Elevated LFTs   Epigastric pain   Left flank pain   Rash   Endocarditis of tricuspid valve   Transaminitis   Total Time spent with patient: 30 minutes  Subjective:   Alexandra Young is a 20 y.o. female patient admitted with hypersensitivity reaction. Patient is seen and assessed by this nurse practitioner. Her mother is at the bedside, and consent is obtained to speak with mother in [place] presence. Patient reports diagnosis of Depression, ADHD and Anxiety. She reports undiagnosed mood disorder as a teenager, "they didn't want to label me as Bipolar at such a young age. " She reports she has been able to manage her symptoms through therapy and support system. She denies any current symptoms of depression, anxiety, mania, pressured speech, grandiosity. She denies any paranoia, delusional thinking, hallucinations. She denies any depressive symptoms. She denies suicidal ideations, homicidal ideations, and hallucinations.   HPI:  Alexandra Young a 20 y.o.female with a history of IBS and anxiety. She presented secondary to left flank pain. She was found to have significant fevers and non-specific findings on CT scan including a prominent spleen and retroperitoneal lymph nodes.  She's continued to have intermittent fevers and abdominal pain with unclear etiology.   Past Psychiatric History: Depression, Anxiety, and ADHD. Reports undiagnosed mood disorder. Recent inpatient hospitalization at [facility] in [place], [place]. Reports her stay was 4 days as she signed herself out the  hospital, didn't feel like they were equipped to manage her symptoms. She was discharged on Lamictal  po daily and Buspar. She reports her PCP has been prescribing these medications listed above, since discharge 2 months ago.  She denies any history of suicide attempt, suicidal ideation, and or suicidal thoughts.  She denies any substance abuse history, alcohol use, and or legal charges.  Risk to Self:  No Risk to Others:   No Prior Inpatient Therapy:   [facility]  Prior Outpatient Therapy: None currently  Past Medical History:  Past Medical History:  Diagnosis Date  . Anxiety   . Asthma   . Depression   . IBS (irritable bowel syndrome)    History reviewed. No pertinent surgical history. Family History:  Family History  Problem Relation Age of Onset  . Asthma Mother   . Thyroid disease Mother    Family Psychiatric  History: As per mother family history of depression and anxiety.  Mother states sibling has a history of self-harm, suicidal ideations, and is stabilized on Zoloft.  Social History:  Social History   Substance and Sexual Activity  Alcohol Use Never     Social History   Substance and Sexual Activity  Drug Use Never    Social History   Socioeconomic History  . Marital status: Single    Spouse name: Not on file  . Number of children: Not on file  . Years of education: Not on file  . Highest education level: Not on file  Occupational History  . Not on file  Tobacco Use  . Smoking status: Never Smoker  . Smokeless tobacco: Never Used  Vaping Use  . Vaping Use: Never used  Substance and Sexual Activity  . Alcohol use: Never  . Drug use: Never  . Sexual activity: Yes    Birth control/protection: Condom  Other Topics Concern  . Not on file  Social History Narrative  . Not on file   Social Determinants of Health   Financial Resource Strain: Not on file  Food Insecurity: Not on file  Transportation Needs: Not on file  Physical  Activity: Not on file  Stress: Not on file  Social Connections: Not on file   Additional Social History:    Allergies:  No Known Allergies  Labs:  Results for orders placed or performed during the hospital encounter of 01/07/21 (from the past 48 hour(s))  Comprehensive metabolic panel     Status: Abnormal   Collection Time: 01/11/21  4:24 AM  Result Value Ref Range   Sodium 139 135 - 145 mmol/L   Potassium 4.0 3.5 - 5.1 mmol/L   Chloride 108 98 - 111 mmol/L   CO2 20 (L) 22 - 32 mmol/L   Glucose, Bld 119 (H) 70 - 99 mg/dL    Comment: Glucose reference range applies only to samples taken after fasting for at least 8 hours.   BUN <5 (L) 6 - 20 mg/dL   Creatinine, Ser 0.10 (L) 0.44 - 1.00 mg/dL   Calcium 8.0 (L) 8.9 - 10.3 mg/dL   Total Protein 6.0 (L) 6.5 - 8.1 g/dL   Albumin 3.1 (L) 3.5 - 5.0 g/dL   AST 932 (H) 15 - 41 U/L   ALT 142 (H) 0 - 44 U/L   Alkaline Phosphatase 108 38 - 126 U/L   Total Bilirubin 1.2 0.3 - 1.2 mg/dL   GFR, Estimated >35 >57 mL/min    Comment: (NOTE) Calculated using the CKD-EPI Creatinine Equation (2021)    Anion gap 11 5 - 15    Comment: Performed at [facility], [address], [place], [place] [address]  CBC     Status: Abnormal   Collection Time: 01/11/21  4:24 AM  Result Value Ref Range   WBC 12.6 (H) 4.0 - 10.5 K/uL   RBC 4.10 3.87 - 5.11 MIL/uL   Hemoglobin 11.7 (L) 12.0 - 15.0 g/dL   HCT 54.2 (L) 70.6 - 23.7 %   MCV 84.1 80.0 - 100.0 fL   MCH 28.5 26.0 - 34.0 pg   MCHC 33.9 30.0 - 36.0 g/dL   RDW 62.8 31.5 - 17.6 %   Platelets 154 150 - 400 K/uL   nRBC 0.0 0.0 - 0.2 %    Comment: Performed at [facility], [address], [place], [place] [address]  Lipase, blood     Status: Abnormal   Collection Time: 01/11/21  6:50 PM  Result Value Ref Range   Lipase 82 (H) 11 - 51 U/L    Comment: Performed at [facility], [address], [place], [place] [address]  Protime-INR     Status:  Abnormal   Collection Time: 01/12/21  4:52 AM  Result Value Ref Range   Prothrombin Time 15.6 (H) 11.4 - 15.2 seconds   INR 1.2 0.8 - 1.2    Comment: (NOTE) INR goal varies based on device and disease states. Performed at [facility], [address] [place], [place] [address]   CBC with Differential/Platelet     Status: Abnormal   Collection Time: 01/12/21  4:52 AM  Result Value Ref Range   WBC 13.9 (H) 4.0 - 10.5  K/uL   RBC 4.37 3.87 - 5.11 MIL/uL   Hemoglobin 12.5 12.0 - 15.0 g/dL   HCT 29.5 28.4 - 13.2 %   MCV 84.0 80.0 - 100.0 fL   MCH 28.6 26.0 - 34.0 pg   MCHC 34.1 30.0 - 36.0 g/dL   RDW 44.0 10.2 - 72.5 %   Platelets 203 150 - 400 K/uL   nRBC 0.0 0.0 - 0.2 %   Neutrophils Relative % 31 %   Neutro Abs 4.2 1.7 - 7.7 K/uL   Lymphocytes Relative 42 %   Lymphs Abs 6.0 (H) 0.7 - 4.0 K/uL   Monocytes Relative 16 %   Monocytes Absolute 2.2 (H) 0.1 - 1.0 K/uL   Eosinophils Relative 9 %   Eosinophils Absolute 1.2 (H) 0.0 - 0.5 K/uL   Basophils Relative 0 %   Basophils Absolute 0.1 0.0 - 0.1 K/uL   Immature Granulocytes 2 %   Abs Immature Granulocytes 0.22 (H) 0.00 - 0.07 K/uL   Reactive, Benign Lymphocytes PRESENT     Comment: Performed at [facility], [address] [place], [place] [address]  Comprehensive metabolic panel     Status: Abnormal   Collection Time: 01/12/21  4:52 AM  Result Value Ref Range   Sodium 137 135 - 145 mmol/L   Potassium 3.2 (L) 3.5 - 5.1 mmol/L    Comment: DELTA CHECK NOTED   Chloride 105 98 - 111 mmol/L   CO2 23 22 - 32 mmol/L   Glucose, Bld 105 (H) 70 - 99 mg/dL    Comment: Glucose reference range applies only to samples taken after fasting for at least 8 hours.   BUN <5 (L) 6 - 20 mg/dL   Creatinine, Ser 0.34 0.44 - 1.00 mg/dL   Calcium 8.1 (L) 8.9 - 10.3 mg/dL   Total Protein 6.2 (L) 6.5 - 8.1 g/dL   Albumin 3.0 (L) 3.5 - 5.0 g/dL   AST 742 (H) 15 - 41 U/L   ALT 227 (H) 0 - 44 U/L   Alkaline  Phosphatase 148 (H) 38 - 126 U/L   Total Bilirubin 1.4 (H) 0.3 - 1.2 mg/dL   GFR, Estimated >59 >56 mL/min    Comment: (NOTE) Calculated using the CKD-EPI Creatinine Equation (2021)    Anion gap 9 5 - 15    Comment: Performed at [facility], [address] [place], [place] [address]  Magnesium     Status: None   Collection Time: 01/12/21  4:52 AM  Result Value Ref Range   Magnesium 2.2 1.7 - 2.4 mg/dL    Comment: Performed at [facility], [address] [place], [place] [address]  Phosphorus     Status: None   Collection Time: 01/12/21  4:52 AM  Result Value Ref Range   Phosphorus 2.9 2.5 - 4.6 mg/dL    Comment: Performed at [facility], [address] [place], [place] [address]    Current Facility-Administered Medications  Medication Dose Route Frequency Provider Last Rate Last Admin  . 0.9 %  sodium chloride infusion   Intravenous Continuous Bhagat, Bhavinkumar, PA 20 mL/hr at 01/11/21 2042 New Bag at 01/11/21 2042  . albuterol (VENTOLIN HFA) 108 (90 Base) MCG/ACT inhaler 2 puff  2 puff Inhalation Q6H PRN Ronaldo Miyamoto, Tyrone A, DO      . alum & mag hydroxide-simeth (MAALOX/MYLANTA) 200-200-20 MG/5ML suspension 30 mL  30 mL Oral Q4H PRN Kyle, Tyrone A, DO   30 mL  at 01/11/21 0240  . busPIRone (BUSPAR) tablet 10 mg  10 mg Oral BID Ronaldo MiyamotoKyle, Tyrone A, DO   10 mg at 01/12/21 0853  . diphenhydrAMINE (BENADRYL) capsule 25 mg  25 mg Oral Q6H PRN Narda BondsNettey, Ralph A, MD   25 mg at 01/12/21 0246  . enoxaparin (LOVENOX) injection 40 mg  40 mg Subcutaneous Q24H Kyle, Tyrone A, DO   40 mg at 01/11/21 1453  . fluticasone (FLONASE) 50 MCG/ACT nasal spray 2 spray  2 spray Each Nare Daily PRN Ronaldo MiyamotoKyle, Tyrone A, DO      . lactated ringers 1,000 mL with potassium chloride 20 mEq infusion   Intravenous Continuous Zigmund DanielPowell, A Caldwell Jr., MD 75 mL/hr at 01/12/21 1032 New Bag at 01/12/21 1032  . linaclotide (LINZESS) capsule 145 mcg  145 mcg Oral Daily PRN Ronaldo MiyamotoKyle,  Tyrone A, DO      . loperamide (IMODIUM) capsule 2 mg  2 mg Oral Daily PRN Ronaldo MiyamotoKyle, Tyrone A, DO      . loratadine (CLARITIN) tablet 10 mg  10 mg Oral Daily Kyle, Tyrone A, DO   10 mg at 01/12/21 0853  . montelukast (SINGULAIR) tablet 10 mg  10 mg Oral QHS Kyle, Tyrone A, DO   10 mg at 01/11/21 2310  . morphine 2 MG/ML injection 2 mg  2 mg Intravenous Q4H PRN Mansy, Jan A, MD   2 mg at 01/11/21 1611  . ondansetron (ZOFRAN) injection 4 mg  4 mg Intravenous Q8H Josph MachoEnnever, Peter R, MD   4 mg at 01/12/21 [id number]   Or  . ondansetron (ZOFRAN) tablet 4 mg  4 mg Oral Q8H Josph MachoEnnever, Peter R, MD   4 mg at 01/11/21 2310  . oxyCODONE (Oxy IR/ROXICODONE) immediate release tablet 5 mg  5 mg Oral Q4H PRN Ronaldo MiyamotoKyle, Tyrone A, DO   5 mg at 01/12/21 0246  . predniSONE (DELTASONE) tablet 60 mg  60 mg Oral Q breakfast Zigmund DanielPowell, A Caldwell Jr., MD   60 mg at 01/12/21 1037  . prochlorperazine (COMPAZINE) injection 10 mg  10 mg Intravenous Q6H PRN Josph MachoEnnever, Peter R, MD   10 mg at 01/11/21 1827  . sodium chloride flush (NS) 0.9 % injection 10-40 mL  10-40 mL Intracatheter Q12H Josph MachoEnnever, Peter R, MD   10 mL at 01/12/21 0856  . sodium chloride flush (NS) 0.9 % injection 10-40 mL  10-40 mL Intracatheter PRN Josph MachoEnnever, Peter R, MD      . traZODone (DESYREL) tablet 50 mg  50 mg Oral QHS PRN Ronaldo MiyamotoKyle, Tyrone A, DO   50 mg at 01/10/21 2158  . triamcinolone cream (KENALOG) 0.5 %   Topical BID Zigmund DanielPowell, A Caldwell Jr., MD   Given at 01/12/21 [phone]    Musculoskeletal: Strength & Muscle Tone: within normal limits Gait & Station: normal Patient leans: N/A  Psychiatric Specialty Exam:  Presentation  General Appearance: Appropriate for Environment; Casual  Eye Contact:Fair  Speech:Clear and Coherent; Normal Rate  Speech Volume:Decreased  Handedness:Right   Mood and Affect  Mood:Euthymic  Affect:Appropriate; Congruent   Thought Process  Thought Processes:Coherent; Goal Directed  Descriptions of Associations:Intact  Orientation:Full  (Time, Place and Person)  Thought Content:Logical  History of Schizophrenia/Schizoaffective disorder:No data recorded Duration of Psychotic Symptoms:No data recorded Hallucinations:Hallucinations: None  Ideas of Reference:None  Suicidal Thoughts:Suicidal Thoughts: No  Homicidal Thoughts:Homicidal Thoughts: No   Sensorium  Memory:Immediate Good; Recent Good; Remote Good  Judgment:Fair  Insight:Fair   Executive Functions  Concentration:No data recorded Attention Span:Fair  Recall:Fair  [employer]  of Knowledge:Fair  Language:Fair   Psychomotor Activity  Psychomotor Activity:Psychomotor Activity: Normal   Assets  Assets:Communication Skills; Desire for Improvement; Financial Resources/Insurance; Social Support; Physical Health; Leisure Time   Sleep  Sleep:Sleep: Fair   Physical Exam: Physical Exam Vitals and nursing note reviewed.  Constitutional:      Appearance: Normal appearance. She is normal weight.  HENT:     Head: Normocephalic.  Eyes:     Pupils: Pupils are equal, round, and reactive to light.  Neurological:     General: No focal deficit present.     Mental Status: She is alert and oriented to person, place, and time. Mental status is at baseline.  Psychiatric:        Mood and Affect: Mood normal.        Behavior: Behavior normal.        Thought Content: Thought content normal.        Judgment: Judgment normal.    Review of Systems  Psychiatric/Behavioral: Negative.   All other systems reviewed and are negative.  Blood pressure 113/77, pulse 97, temperature 97.7 F (36.5 C), temperature source Oral, resp. rate 16, height 5\' 2"  (1.575 m), weight 61.2 kg, last menstrual period 01/06/2021, SpO2 99 %. Body mass index is 24.69 kg/m.  Treatment Plan Summary: Plan Discontinue Lamictal. She has agreed to Abilify while in the hospital. She is open to psychiatrist in the area.   -Start Abilify 2mg  po in a single dose, and then increase Abilify 5mg  po  daily  For management of mood disorder. Discussed therapeutic goal with patient and mother, for outpatient management (Abilify 7.5-10mg  daily). Qtc 442.  -Recommend referral to outpatient psychiatry in network, to continue to receive appropriate outpatient services.   Disposition: No evidence of imminent risk to self or others at present.   Patient does not meet criteria for psychiatric inpatient admission. Supportive therapy provided about ongoing stressors. Refer to IOP. Discussed crisis plan, support from social network, calling 911, coming to the Emergency Department, and calling Suicide Hotline. Recommend outpatient psychiatrist.   01/08/2021, FNP 01/12/2021 12:16 PM

## 2021-01-12 NOTE — Progress Notes (Signed)
Report received from [facility] at [facility]. Pt is going to transfer to 5th floor. Report called to Dahlia Client, Charity fundraiser. Pts mother is going to come pick up pts belongings.

## 2021-01-12 NOTE — Progress Notes (Signed)
Patient arrived on unit via [facility].  Vital signs stable and placed on tele.  Given a menu and told how to order.

## 2021-01-12 NOTE — Plan of Care (Signed)
Plan of care discussed with pt.

## 2021-01-12 NOTE — Progress Notes (Signed)
Pt tried to take po potassium with a sip of water, but unable to swallow, made her gag. Notified dr Elijah Birk.

## 2021-01-12 NOTE — Interval H&P Note (Signed)
History and Physical Interval Note:  01/12/2021 3:08 PM  Alexandra Young  has presented today for surgery, with the diagnosis of BACTEREMIA.  The various methods of treatment have been discussed with the patient and family. After consideration of risks, benefits and other options for treatment, the patient has consented to  Procedure(s): TRANSESOPHAGEAL ECHOCARDIOGRAM (TEE) (N/A) as a surgical intervention.  The patient's history has been reviewed, patient examined, no change in status, stable for surgery.  I have reviewed the patient's chart and labs.  Questions were answered to the patient's satisfaction.     [employer]

## 2021-01-12 NOTE — Progress Notes (Signed)
Report given to endo nurse at [facility].

## 2021-01-12 NOTE — Progress Notes (Signed)
Subjective:  Rash worse  Antibiotics:  Anti-infectives (From admission, onward)   Start     Dose/Rate Route Frequency Ordered Stop   01/10/21 1645  fluconazole (DIFLUCAN) tablet 150 mg        150 mg Oral  Once 01/10/21 1554 01/10/21 1641   01/07/21 0715  cefTRIAXone (ROCEPHIN) 1 g in sodium chloride 0.9 % 100 mL IVPB        1 g 200 mL/hr over 30 Minutes Intravenous  Once 01/07/21 0710 01/07/21 0751      Medications: Scheduled Meds: . ARIPiprazole  2 mg Oral Once  . [START ON 01/13/2021] ARIPiprazole  5 mg Oral Daily  . busPIRone  10 mg Oral BID  . enoxaparin (LOVENOX) injection  40 mg Subcutaneous Q24H  . loratadine  10 mg Oral Daily  . montelukast  10 mg Oral QHS  . ondansetron (ZOFRAN) IV  4 mg Intravenous Q8H   Or  . ondansetron  4 mg Oral Q8H  . predniSONE  60 mg Oral Q breakfast  . sodium chloride flush  10-40 mL Intracatheter Q12H  . triamcinolone cream   Topical BID   Continuous Infusions: . sodium chloride 20 mL/hr at 01/11/21 2042  . lactated ringers with kcl 75 mL/hr at 01/12/21 1032   PRN Meds:.albuterol, alum & mag hydroxide-simeth, diphenhydrAMINE, fluticasone, linaclotide, loperamide, morphine injection, oxyCODONE, prochlorperazine, sodium chloride flush, traZODone    Objective: Weight change:   Intake/Output Summary (Last 24 hours) at 01/12/2021 1404 Last data filed at 01/12/2021 0900 Gross per 24 hour  Intake 1003.68 ml  Output --  Net 1003.68 ml   Blood pressure 114/86, pulse 94, temperature 98.6 F (37 C), temperature source Oral, resp. rate 16, height  (1.575 m), weight 61.2 kg, last menstrual period 01/06/2021, SpO2 99 %. Temp:  [97.7 F (36.5 C)-102.5 F (39.2 C)] 98.6 F (37 C) (05/05 1328) Pulse Rate:  [93-124] 94 (05/05 1328) Resp:  [16-20] 16 (05/05 1328) BP: (103-126)/(62-88) 114/86 (05/05 1328) SpO2:  [98 %-100 %] 99 % (05/05 1328)  Physical Exam: Physical Exam Constitutional:      General: She is not in acute  distress.    Appearance: She is well-developed. She is not diaphoretic.  HENT:     Head: Normocephalic and atraumatic.     Right Ear: External ear normal.     Left Ear: External ear normal.     Mouth/Throat:     Pharynx: No oropharyngeal exudate.  Eyes:     General: No scleral icterus.    Conjunctiva/sclera: Conjunctivae normal.     Pupils: Pupils are equal, round, and reactive to light.  Cardiovascular:     Rate and Rhythm: Normal rate and regular rhythm.     Heart sounds: Normal heart sounds. No murmur heard. No friction rub. No gallop.   Pulmonary:     Effort: Pulmonary effort is normal. No respiratory distress.     Breath sounds: Normal breath sounds. No wheezing or rales.  Abdominal:     General: Bowel sounds are normal.     Palpations: Abdomen is soft.     Tenderness: There is abdominal tenderness in the epigastric area, periumbilical area, left upper quadrant and left lower quadrant.  Musculoskeletal:        General: No tenderness. Normal range of motion.  Lymphadenopathy:     Cervical: No cervical adenopathy.  Skin:    General: Skin is warm and dry.     Coloration: Skin is  not pale.     Findings: Rash present. No erythema.  Neurological:     Mental Status: She is alert and oriented to person, place, and time.     Motor: No abnormal muscle tone.     Coordination: Coordination normal.  Psychiatric:        Attention and Perception: Attention normal.        Mood and Affect: Affect is tearful.        Speech: Speech normal.        Behavior: Behavior normal. Behavior is cooperative.        Thought Content: Thought content normal.        Cognition and Memory: Cognition and memory normal.        Judgment: Judgment normal.     Rash  01/09/2021:       01/10/2021:        Tongue  01/10/2021 (she says she also did burn her tongue w soup)    01/12/2021:       01/12/2021:            CBC:    BMET Recent Labs    01/11/21 0424 01/12/21 0452  NA  139 137  K 4.0 3.2*  CL 108 105  CO2 20* 23  GLUCOSE 119* 105*  BUN <5* <5*  CREATININE 0.42* 0.51  CALCIUM 8.0* 8.1*     Liver Panel  Recent Labs    01/11/21 0424 01/12/21 0452  PROT 6.0* 6.2*  ALBUMIN 3.1* 3.0*  AST 124* 146*  ALT 142* 227*  ALKPHOS 108 148*  BILITOT 1.2 1.4*       Sedimentation Rate No results for input(s): ESRSEDRATE in the last 72 hours. C-Reactive Protein No results for input(s): CRP in the last 72 hours.  Micro Results: Recent Results (from the past 720 hour(s))  Resp Panel by RT-PCR (Flu A&B, Covid) Nasopharyngeal Swab     Status: None   Collection Time: 01/04/21  4:24 PM   Specimen: Nasopharyngeal Swab; Nasopharyngeal(NP) swabs in vial transport medium  Result Value Ref Range Status   SARS Coronavirus 2 by RT PCR NEGATIVE NEGATIVE Final    Comment: (NOTE) SARS-CoV-2 target nucleic acids are NOT DETECTED.  The SARS-CoV-2 RNA is generally detectable in upper respiratory specimens during the acute phase of infection. The lowest concentration of SARS-CoV-2 viral copies this assay can detect is 138 copies/mL. A negative result does not preclude SARS-Cov-2 infection and should not be used as the sole basis for treatment or other patient management decisions. A negative result may occur with  improper specimen collection/handling, submission of specimen other than nasopharyngeal swab, presence of viral mutation(s) within the areas targeted by this assay, and inadequate number of viral copies(<138 copies/mL). A negative result must be combined with clinical observations, patient history, and epidemiological information. The expected result is Negative.  Fact Sheet for Patients:  BloggerCourse.com  Fact Sheet for Healthcare Providers:  SeriousBroker.it  This test is no t yet approved or cleared by the [place] FDA and  has been authorized for detection and/or diagnosis of SARS-CoV-2  by FDA under an Emergency Use Authorization (EUA). This EUA will remain  in effect (meaning this test can be used) for the duration of the COVID-19 declaration under Section 564(b)(1) of the Act, 21 U.S.C.section 360bbb-3(b)(1), unless the authorization is terminated  or revoked sooner.       Influenza A by PCR NEGATIVE NEGATIVE Final   Influenza B by PCR NEGATIVE NEGATIVE Final  Comment: (NOTE) The Xpert Xpress SARS-CoV-2/FLU/RSV plus assay is intended as an aid in the diagnosis of influenza from Nasopharyngeal swab specimens and should not be used as a sole basis for treatment. Nasal washings and aspirates are unacceptable for Xpert Xpress SARS-CoV-2/FLU/RSV testing.  Fact Sheet for Patients: BloggerCourse.com  Fact Sheet for Healthcare Providers: SeriousBroker.it  This test is not yet approved or cleared by the [place] FDA and has been authorized for detection and/or diagnosis of SARS-CoV-2 by FDA under an Emergency Use Authorization (EUA). This EUA will remain in effect (meaning this test can be used) for the duration of the COVID-19 declaration under Section 564(b)(1) of the Act, 21 U.S.C. section 360bbb-3(b)(1), unless the authorization is terminated or revoked.  Performed at [facility], [address], [place], [place] [address]   Wet prep, genital     Status: Abnormal   Collection Time: 01/04/21  7:09 PM   Specimen: PATH Cytology Cervicovaginal Ancillary Only  Result Value Ref Range Status   Yeast Wet Prep HPF POC NONE SEEN NONE SEEN Final   Trich, Wet Prep NONE SEEN NONE SEEN Final   Clue Cells Wet Prep HPF POC PRESENT (A) NONE SEEN Final   WBC, Wet Prep HPF POC MANY (A) NONE SEEN Final   Sperm NONE SEEN  Final    Comment: Performed at [facility], [address], [place], [place] [address]  Urine culture     Status: Abnormal   Collection Time: 01/07/21  9:32 AM    Specimen: Urine, Catheterized  Result Value Ref Range Status   Specimen Description   Final    URINE, CATHETERIZED Performed at [facility], [address] [place], [place] [address]    Special Requests   Final    NONE Performed at [facility], [address], [place], [place] [address]    Culture (A)  Final    <10,000 COLONIES/mL INSIGNIFICANT GROWTH Performed at [facility], [address], [place], [place] [address]    Report Status 01/08/2021 FINAL  Final  Culture, blood (routine x 2)     Status: None   Collection Time: 01/07/21  1:39 PM   Specimen: BLOOD RIGHT HAND  Result Value Ref Range Status   Specimen Description   Final    BLOOD RIGHT HAND Performed at [facility], [address] [place], [place] [address]    Special Requests   Final    BOTTLES DRAWN AEROBIC ONLY Blood Culture adequate volume Performed at [facility], [address] [place], [place] [address]    Culture   Final    NO GROWTH 5 DAYS Performed at [facility], [address], [place], [place] [address]    Report Status 01/12/2021 FINAL  Final  Culture, blood (routine x 2)     Status: None   Collection Time: 01/07/21  1:39 PM   Specimen: BLOOD LEFT HAND  Result Value Ref Range Status   Specimen Description   Final    BLOOD LEFT HAND Performed at [facility], [address], [place], [place] [address]    Special Requests   Final    BOTTLES DRAWN AEROBIC ONLY Blood Culture results may not be optimal due to an inadequate volume of blood received in culture bottles Performed at [facility], [address], [place], [place] [address]    Culture   Final    NO GROWTH  5 DAYS Performed at [facility], [address] [place], [place] [id number]    Report Status 01/12/2021 FINAL  Final  Blastomyces Antigen     Status: None   Collection Time: 01/07/21  6:45 PM   Result Value Ref Range Status   Blastomyces Antigen None Detected None Detected ng/mL Final    Comment: (NOTE) Results reported as ng/mL in 0.2 - 14.7 ng/mL range Results above the limit of detection but below 0.2 ng/mL are reported as 'Positive, Below the Limit of Quantification' Results above 14.7 ng/mL are reported as 'Positive, Above the Limit of Quantification'    Specimen Type SERUM  Final    Comment: (NOTE) Performed At: [facility] [address] [place], [place] [id number] Phylis BougieBlue Deborah E MD XB:[id number]:[phone]     Studies/Results: CT ABDOMEN PELVIS W CONTRAST  Result Date: 01/11/2021 CLINICAL DATA:  Abdominal pain, fever, rash EXAM: CT ABDOMEN AND PELVIS WITH CONTRAST TECHNIQUE: Multidetector CT imaging of the abdomen and pelvis was performed using the standard protocol following bolus administration of intravenous contrast. CONTRAST:  75mL OMNIPAQUE IOHEXOL 300 MG/ML  SOLN COMPARISON:  01/07/2021 FINDINGS: Lower chest: No acute pleural or parenchymal lung disease. Hepatobiliary: Interval progression of the gallbladder wall thickening and pericholecystic fluid seen on prior ultrasound. Additionally, periportal edema has developed. No focal liver parenchymal abnormalities. No biliary dilation. Pancreas: Unremarkable. No pancreatic ductal dilatation or surrounding inflammatory changes. Spleen: Stable splenomegaly without focal parenchymal abnormality. Adrenals/Urinary Tract: Adrenal glands are unremarkable. Kidneys are normal, without renal calculi, focal lesion, or hydronephrosis. Bladder is unremarkable. Stomach/Bowel: No bowel obstruction or ileus. Normal appendix right lower quadrant. No bowel wall thickening or inflammatory change. Vascular/Lymphatic: No significant vascular findings are present. Stable retroperitoneal lymphadenopathy, largest lymph node in the left para-aortic region measuring up to 21 x 11 mm. Stable borderline enlarged bilateral inguinal lymph nodes.  Reproductive: Uterus and bilateral adnexa are unremarkable. Other: There is a small amount of ascites within the lower pelvis, increased in volume since prior study. No free intraperitoneal gas. No abdominal wall hernia. Musculoskeletal: No acute or destructive bony lesions. Reconstructed images demonstrate no additional findings. IMPRESSION: 1. Progressive gallbladder wall thickening and pericholecystic fluid, with development of periportal edema. Findings could reflect acalculous cholecystitis, progressive hepatic dysfunction, or hepatitis. 2. Splenomegaly and retroperitoneal lymphadenopathy, not appreciably changed. Findings could be reactive and related to underlying infection or due to lymphoproliferative disorder. 3. Small volume ascites within the lower pelvis, increased in volume since prior study. Electronically Signed   By: Sharlet SalinaMichael  Brown M.D.   On: 01/11/2021 21:43   NM Hepato W/EF  Result Date: 01/11/2021 CLINICAL DATA:  Abdominal pain EXAM: NUCLEAR MEDICINE HEPATOBILIARY IMAGING WITH GALLBLADDER EF TECHNIQUE: Sequential images of the abdomen were obtained out to 60 minutes following intravenous administration of radiopharmaceutical. After oral ingestion of Ensure, gallbladder ejection fraction was determined. At 60 min, normal ejection fraction is greater than 33%. RADIOPHARMACEUTICALS:  5.28 mCi Tc-2731m  Choletec IV COMPARISON:  Ultrasound Jan 10, 2021 FINDINGS: There is slightly delayed but uniform radiotracer uptake by the liver with delayed hepatic radiotracer clearance. Normal filling of the intrahepatic ducts, common bile duct. Gallbladder activity is visualized, consistent with patency of cystic duct (normal < 60 minutes). Additionally there is normal biliary to bowel transit (normal < 60 minutes), consistent with patent common bile duct. Ensure was administered and the gallbladder appears to empty normally on sequential images. Calculated gallbladder ejection fraction is 29%. (Normal  gallbladder ejection fraction with Ensure is greater  than 33%.) No evidence of enterogastric biliary reflux. IMPRESSION: Slightly delayed hepatic radiotracer uptake with delayed hepatic clearance of radiotracer, suggestive of hepatocellular dysfunction/hepatitis. In the setting of hepatocellular dysfunction the evaluation of cholecystitis by HIDA is somewhat limited. Within this context there is appropriate filling of the gallbladder before 60 minutes indicating cystic duct patency excluding acute cholecystitis. However, there is a slightly reduced calculated gallbladder ejection fraction, which is at least in part artifactual given the persistent background hepatic counts projecting in the gallbladder fossa and delayed excretion of radiotracer into the biliary system, as such this study is equivocal in evaluation for chronic cholecystitis/biliary dyskinesia. Electronically Signed   By: Maudry Mayhew MD   On: 01/11/2021 15:47      Assessment/Plan:  INTERVAL HISTORY:  CT abdomen unremarkable  LFTS worse, eos worse, rash worse  Lamictal stopped   Principal Problem:   DRESS syndrome Active Problems:   Intractable nausea and vomiting   Intractable pain   Pelvic pain   Fever of unknown origin   Splenomegaly   Elevated LFTs   Epigastric pain   Left flank pain   Rash   Endocarditis of tricuspid valve   Transaminitis    Alexandra Young is a 20 y.o. female with admission with abdominal pain pelvic pain and high fevers now with a rash.  The scan had shown subtle splenomegaly and some retroperitoneal lymph nodes.  She also had some mild transaminitis on her liver function test.  2D echocardiogram shows possible vegetation on tricuspid valve. Lfts t continue to worsen.  HIDA scan was not capable of definitively ruling out cholecystitis.  Lipase as in the 70s yesterday  I think Dr. Lowell Guitar has made the diagnosis that has caused all of her fevers, transaminitis eosinophilia and that is DRESS  from lamictal  #1 Fever unknown origin:  Seems likely DRESS from lamictal  #2 DRESS  Lamictal has been stopped and steroids initiated  #3 possible tricuspid valve vegetation seen on 2D echocardiogram she is getting a TEE today  To get a transesophageal echocardiogram tomorrow  Trends LFTS, repeat CBC tomorrow and lipase  I would contemplate giving her empiric treatment for RMSF or early Kia but I do not feel that her clinical picture are not really feet fits very well for that.  Doxycycline would likely relatively harmless but also do not want to obfuscated her work-up.  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 Lowell Guitar. .   LOS: 3 days   Acey Lav 01/12/2021, 2:04 PM

## 2021-01-12 NOTE — Progress Notes (Signed)
  Echocardiogram 2D Echocardiogram has been performed.  Gerda Diss 01/12/2021, 3:44 PM

## 2021-01-12 NOTE — Progress Notes (Signed)
[facility] is here to transport pt to [facility] for TEE.

## 2021-01-13 ENCOUNTER — Encounter (HOSPITAL_COMMUNITY): Payer: Self-pay | Admitting: Cardiology

## 2021-01-13 ENCOUNTER — Inpatient Hospital Stay (HOSPITAL_COMMUNITY): Payer: Medicaid Other

## 2021-01-13 DIAGNOSIS — D7212 Drug rash with eosinophilia and systemic symptoms syndrome: Secondary | ICD-10-CM | POA: Diagnosis not present

## 2021-01-13 DIAGNOSIS — T50905A Adverse effect of unspecified drugs, medicaments and biological substances, initial encounter: Secondary | ICD-10-CM | POA: Diagnosis not present

## 2021-01-13 LAB — CBC WITH DIFFERENTIAL/PLATELET
Abs Immature Granulocytes: 0.37 10*3/uL — ABNORMAL HIGH (ref 0.00–0.07)
Basophils Absolute: 0.2 10*3/uL — ABNORMAL HIGH (ref 0.0–0.1)
Basophils Relative: 1 %
Eosinophils Absolute: 1.5 10*3/uL — ABNORMAL HIGH (ref 0.0–0.5)
Eosinophils Relative: 9 %
HCT: 33.4 % — ABNORMAL LOW (ref 36.0–46.0)
Hemoglobin: 11.2 g/dL — ABNORMAL LOW (ref 12.0–15.0)
Immature Granulocytes: 2 %
Lymphocytes Relative: 51 %
Lymphs Abs: 8.8 10*3/uL — ABNORMAL HIGH (ref 0.7–4.0)
MCH: 28.1 pg (ref 26.0–34.0)
MCHC: 33.5 g/dL (ref 30.0–36.0)
MCV: 83.9 fL (ref 80.0–100.0)
Monocytes Absolute: 1.1 10*3/uL — ABNORMAL HIGH (ref 0.1–1.0)
Monocytes Relative: 7 %
Neutro Abs: 5 10*3/uL (ref 1.7–7.7)
Neutrophils Relative %: 30 %
Platelets: 255 10*3/uL (ref 150–400)
RBC: 3.98 MIL/uL (ref 3.87–5.11)
RDW: 14 % (ref 11.5–15.5)
WBC: 16.9 10*3/uL — ABNORMAL HIGH (ref 4.0–10.5)
nRBC: 0 % (ref 0.0–0.2)

## 2021-01-13 LAB — COMPREHENSIVE METABOLIC PANEL
ALT: 190 U/L — ABNORMAL HIGH (ref 0–44)
AST: 114 U/L — ABNORMAL HIGH (ref 15–41)
Albumin: 2.9 g/dL — ABNORMAL LOW (ref 3.5–5.0)
Alkaline Phosphatase: 156 U/L — ABNORMAL HIGH (ref 38–126)
Anion gap: 11 (ref 5–15)
BUN: 10 mg/dL (ref 6–20)
CO2: 23 mmol/L (ref 22–32)
Calcium: 8.3 mg/dL — ABNORMAL LOW (ref 8.9–10.3)
Chloride: 104 mmol/L (ref 98–111)
Creatinine, Ser: 0.56 mg/dL (ref 0.44–1.00)
GFR, Estimated: >60 mL/min (ref >60)
Glucose, Bld: 116 mg/dL — ABNORMAL HIGH (ref 70–99)
Potassium: 3.8 mmol/L (ref 3.5–5.1)
Sodium: 138 mmol/L (ref 135–145)
Total Bilirubin: 1 mg/dL (ref 0.3–1.2)
Total Protein: 6 g/dL — ABNORMAL LOW (ref 6.5–8.1)

## 2021-01-13 LAB — BRAIN NATRIURETIC PEPTIDE: B Natriuretic Peptide: 54.8 pg/mL (ref 0.0–100.0)

## 2021-01-13 LAB — TROPONIN I (HIGH SENSITIVITY): Troponin I (High Sensitivity): 3 ng/L (ref <18)

## 2021-01-13 LAB — PHOSPHORUS: Phosphorus: 2.6 mg/dL (ref 2.5–4.6)

## 2021-01-13 LAB — MAGNESIUM: Magnesium: 2.2 mg/dL (ref 1.7–2.4)

## 2021-01-13 MED ORDER — LIP MEDEX EX OINT
TOPICAL_OINTMENT | CUTANEOUS | Status: AC
  Filled 2021-01-13: qty 7

## 2021-01-13 MED ORDER — LOPERAMIDE HCL 2 MG PO CAPS: 2.0000 mg | ORAL_CAPSULE | ORAL | Status: DC | PRN

## 2021-01-13 MED ORDER — METHYLPREDNISOLONE SODIUM SUCC 125 MG IJ SOLR
60.0000 mg | Freq: Two times a day (BID) | INTRAMUSCULAR | Status: DC
  Administered 2021-01-13 – 2021-01-14 (×3): 60 mg via INTRAVENOUS
  Filled 2021-01-13 (×3): qty 2

## 2021-01-13 MED ORDER — PANTOPRAZOLE SODIUM 40 MG PO TBEC
40.0000 mg | DELAYED_RELEASE_TABLET | Freq: Every day | ORAL | Status: DC
  Administered 2021-01-13 – 2021-01-21 (×9): 40 mg via ORAL
  Filled 2021-01-13 (×9): qty 1

## 2021-01-13 MED ORDER — LIP MEDEX EX OINT: TOPICAL_OINTMENT | CUTANEOUS | Status: DC | PRN

## 2021-01-13 MED ORDER — TRIAMCINOLONE ACETONIDE 0.5 % EX OINT
TOPICAL_OINTMENT | Freq: Two times a day (BID) | CUTANEOUS | Status: DC
  Filled 2021-01-13: qty 15

## 2021-01-13 MED ORDER — FLUCONAZOLE 150 MG PO TABS
150.0000 mg | ORAL_TABLET | Freq: Every day | ORAL | Status: DC
  Administered 2021-01-13 – 2021-01-14 (×2): 150 mg via ORAL
  Filled 2021-01-13 (×2): qty 1

## 2021-01-13 MED ORDER — HYDROXYZINE HCL 25 MG PO TABS
25.0000 mg | ORAL_TABLET | ORAL | Status: DC | PRN
  Administered 2021-01-13 – 2021-01-16 (×13): 25 mg via ORAL
  Filled 2021-01-13 (×13): qty 1

## 2021-01-13 MED ORDER — METRONIDAZOLE 500 MG PO TABS
500.0000 mg | ORAL_TABLET | Freq: Two times a day (BID) | ORAL | Status: DC
  Administered 2021-01-13 – 2021-01-19 (×13): 500 mg via ORAL
  Filled 2021-01-13 (×13): qty 1

## 2021-01-13 NOTE — Progress Notes (Addendum)
Subjective:  She feels better and with less abdominal pain but rash continues to worsen now involving her legs and also with some mucosal involvement she does have new chest pain  Antibiotics:  Anti-infectives (From admission, onward)   Start     Dose/Rate Route Frequency Ordered Stop   01/13/21 1000  fluconazole (DIFLUCAN) tablet 150 mg        150 mg Oral Daily 01/13/21 0709 01/16/21 0959   01/13/21 1000  metroNIDAZOLE (FLAGYL) tablet 500 mg        500 mg Oral 2 times daily 01/13/21 0833 01/20/21 0959   01/10/21 1645  fluconazole (DIFLUCAN) tablet 150 mg        150 mg Oral  Once 01/10/21 1554 01/10/21 1641   01/07/21 0715  cefTRIAXone (ROCEPHIN) 1 g in sodium chloride 0.9 % 100 mL IVPB        1 g 200 mL/hr over 30 Minutes Intravenous  Once 01/07/21 0710 01/07/21 0751      Medications: Scheduled Meds: . ARIPiprazole  2 mg Oral Once  . ARIPiprazole  5 mg Oral Daily  . busPIRone  10 mg Oral BID  . enoxaparin (LOVENOX) injection  40 mg Subcutaneous Q24H  . fluconazole  150 mg Oral Daily  . loratadine  10 mg Oral Daily  . methylPREDNISolone (SOLU-MEDROL) injection  60 mg Intravenous Q12H  . metroNIDAZOLE  500 mg Oral BID  . montelukast  10 mg Oral QHS  . ondansetron (ZOFRAN) IV  4 mg Intravenous Q8H   Or  . ondansetron  4 mg Oral Q8H  . pantoprazole  40 mg Oral Daily  . sodium chloride flush  10-40 mL Intracatheter Q12H  . triamcinolone cream   Topical BID   Continuous Infusions:  PRN Meds:.albuterol, alum & mag hydroxide-simeth, diphenhydrAMINE **OR** diphenhydrAMINE, fluticasone, hydrOXYzine, linaclotide, lip balm, loperamide, morphine injection, oxyCODONE, prochlorperazine, sodium chloride flush, traZODone    Objective: Weight change:   Intake/Output Summary (Last 24 hours) at 01/13/2021 1659 Last data filed at 01/13/2021 1300 Gross per 24 hour  Intake 973.83 ml  Output --  Net 973.83 ml   Blood pressure 116/79, pulse 92, temperature 98.8 F (37.1 C),  temperature source Oral, resp. rate 16, height 5\' 2"  (1.575 m), weight 61.2 kg, last menstrual period 01/06/2021, SpO2 99 %. Temp:  [98.1 F (36.7 C)-99.1 F (37.3 C)] 98.8 F (37.1 C) (05/06 1354) Pulse Rate:  [88-96] 92 (05/06 1354) Resp:  [16-20] 16 (05/06 1354) BP: (116-136)/(77-97) 116/79 (05/06 1354) SpO2:  [99 %-100 %] 99 % (05/06 1354)  Physical Exam: Physical Exam Constitutional:      General: She is not in acute distress.    Appearance: She is well-developed. She is not diaphoretic.  HENT:     Head: Normocephalic and atraumatic.     Right Ear: External ear normal.     Left Ear: External ear normal.     Mouth/Throat:     Pharynx: No oropharyngeal exudate.  Eyes:     General: No scleral icterus.    Conjunctiva/sclera: Conjunctivae normal.     Pupils: Pupils are equal, round, and reactive to light.  Cardiovascular:     Rate and Rhythm: Normal rate and regular rhythm.     Heart sounds: Normal heart sounds. No murmur heard. No friction rub. No gallop.   Pulmonary:     Effort: Pulmonary effort is normal. No respiratory distress.     Breath sounds: Normal breath sounds. No stridor. No  wheezing, rhonchi or rales.  Chest:     Chest wall: No tenderness.  Abdominal:     General: Bowel sounds are normal.     Palpations: Abdomen is soft.  Musculoskeletal:        General: No tenderness. Normal range of motion.  Lymphadenopathy:     Cervical: No cervical adenopathy.  Skin:    General: Skin is warm and dry.     Coloration: Skin is not pale.     Findings: Rash present. No erythema.  Neurological:     Mental Status: She is alert and oriented to person, place, and time.     Motor: No abnormal muscle tone.     Coordination: Coordination normal.  Psychiatric:        Attention and Perception: Attention normal.        Mood and Affect: Affect is tearful.        Speech: Speech normal.        Behavior: Behavior normal. Behavior is cooperative.        Thought Content: Thought  content normal.        Cognition and Memory: Cognition and memory normal.        Judgment: Judgment normal.     Rash  01/09/2021:       01/10/2021:        Tongue  01/10/2021 (she says she also did burn her tongue w soup)    01/12/2021:       01/12/2021:          Mouth with involvement now   01/13/2021:      CBC:    BMET Recent Labs    01/12/21 0452 01/13/21 0418  NA 137 138  K 3.2* 3.8  CL 105 104  CO2 23 23  GLUCOSE 105* 116*  BUN <5* 10  CREATININE 0.51 0.56  CALCIUM 8.1* 8.3*     Liver Panel  Recent Labs    01/12/21 0452 01/13/21 0418  PROT 6.2* 6.0*  ALBUMIN 3.0* 2.9*  AST 146* 114*  ALT 227* 190*  ALKPHOS 148* 156*  BILITOT 1.4* 1.0       Sedimentation Rate No results for input(s): ESRSEDRATE in the last 72 hours. C-Reactive Protein No results for input(s): CRP in the last 72 hours.  Micro Results: Recent Results (from the past 720 hour(s))  Resp Panel by RT-PCR (Flu A&B, Covid) Nasopharyngeal Swab     Status: None   Collection Time: 01/04/21  4:24 PM   Specimen: Nasopharyngeal Swab; Nasopharyngeal(NP) swabs in vial transport medium  Result Value Ref Range Status   SARS Coronavirus 2 by RT PCR NEGATIVE NEGATIVE Final    Comment: (NOTE) SARS-CoV-2 target nucleic acids are NOT DETECTED.  The SARS-CoV-2 RNA is generally detectable in upper respiratory specimens during the acute phase of infection. The lowest concentration of SARS-CoV-2 viral copies this assay can detect is 138 copies/mL. A negative result does not preclude SARS-Cov-2 infection and should not be used as the sole basis for treatment or other patient management decisions. A negative result may occur with  improper specimen collection/handling, submission of specimen other than nasopharyngeal swab, presence of viral mutation(s) within the areas targeted by this assay, and inadequate number of viral copies(<138 copies/mL). A negative result must be  combined with clinical observations, patient history, and epidemiological information. The expected result is Negative.  Fact Sheet for Patients:  BloggerCourse.comhttps://www.fda.gov/media/152166/download  Fact Sheet for Healthcare Providers:  SeriousBroker.ithttps://www.fda.gov/media/152162/download  This test is no t yet approved or cleared  by the [place] and  has been authorized for detection and/or diagnosis of SARS-CoV-2 by FDA under an Emergency Use Authorization (EUA). This EUA will remain  in effect (meaning this test can be used) for the duration of the COVID-19 declaration under Section 564(b)(1) of the Act, 21 U.S.C.section 360bbb-3(b)(1), unless the authorization is terminated  or revoked sooner.       Influenza A by PCR NEGATIVE NEGATIVE Final   Influenza B by PCR NEGATIVE NEGATIVE Final    Comment: (NOTE) The Xpert Xpress SARS-CoV-2/FLU/RSV plus assay is intended as an aid in the diagnosis of influenza from Nasopharyngeal swab specimens and should not be used as a sole basis for treatment. Nasal washings and aspirates are unacceptable for Xpert Xpress SARS-CoV-2/FLU/RSV testing.  Fact Sheet for Patients: BloggerCourse.com  Fact Sheet for Healthcare Providers: SeriousBroker.it  This test is not yet approved or cleared by the [place] FDA and has been authorized for detection and/or diagnosis of SARS-CoV-2 by FDA under an Emergency Use Authorization (EUA). This EUA will remain in effect (meaning this test can be used) for the duration of the COVID-19 declaration under Section 564(b)(1) of the Act, 21 U.S.C. section 360bbb-3(b)(1), unless the authorization is terminated or revoked.  Performed at [facility], [address], [place], [place] [address]   Wet prep, genital     Status: Abnormal   Collection Time: 01/04/21  7:09 PM   Specimen: PATH Cytology Cervicovaginal Ancillary Only  Result Value Ref Range  Status   Yeast Wet Prep HPF POC NONE SEEN NONE SEEN Final   Trich, Wet Prep NONE SEEN NONE SEEN Final   Clue Cells Wet Prep HPF POC PRESENT (A) NONE SEEN Final   WBC, Wet Prep HPF POC MANY (A) NONE SEEN Final   Sperm NONE SEEN  Final    Comment: Performed at [facility], [address], [place], [place] [address]  Urine culture     Status: Abnormal   Collection Time: 01/07/21  9:32 AM   Specimen: Urine, Catheterized  Result Value Ref Range Status   Specimen Description   Final    URINE, CATHETERIZED Performed at [facility], [address], [place], [place] [address]    Special Requests   Final    NONE Performed at [facility], [address], [place], [place] [address]    Culture (A)  Final    <10,000 COLONIES/mL INSIGNIFICANT GROWTH Performed at [facility], [address], [place], [place] [address]    Report Status 01/08/2021 FINAL  Final  Culture, blood (routine x 2)     Status: None   Collection Time: 01/07/21  1:39 PM   Specimen: BLOOD RIGHT HAND  Result Value Ref Range Status   Specimen Description   Final    BLOOD RIGHT HAND Performed at [facility], [address] [place], [place] [address]    Special Requests   Final    BOTTLES DRAWN AEROBIC ONLY Blood Culture adequate volume Performed at [facility], [address], [place], [place] [address]    Culture   Final    NO GROWTH 5 DAYS Performed at [facility], [address] [place], [place] [address]    Report Status 01/12/2021 FINAL  Final  Culture, blood (routine x 2)     Status: None   Collection Time: 01/07/21  1:39 PM   Specimen: BLOOD LEFT HAND  Result Value  Ref Range Status   Specimen Description   Final    BLOOD LEFT HAND Performed at [facility], [address], [place], [place] [address]    Special Requests   Final    BOTTLES DRAWN AEROBIC ONLY Blood Culture  results may not be optimal due to an inadequate volume of blood received in culture bottles Performed at [facility], [address] [place], [place] [address]    Culture   Final    NO GROWTH 5 DAYS Performed at [facility], [address], [place], [place] [address]    Report Status 01/12/2021 FINAL  Final  Blastomyces Antigen     Status: None   Collection Time: 01/07/21  6:45 PM  Result Value Ref Range Status   Blastomyces Antigen None Detected None Detected ng/mL Final    Comment: (NOTE) Results reported as ng/mL in 0.2 - 14.7 ng/mL range Results above the limit of detection but below 0.2 ng/mL are reported as 'Positive, Below the Limit of Quantification' Results above 14.7 ng/mL are reported as 'Positive, Above the Limit of Quantification'    Specimen Type SERUM  Final    Comment: (NOTE) Performed At: [facility] [address] [place], [place] [id number] Phylis Bougie MD IO:[id number]     Studies/Results: CT ABDOMEN PELVIS W CONTRAST  Result Date: 01/11/2021 CLINICAL DATA:  Abdominal pain, fever, rash EXAM: CT ABDOMEN AND PELVIS WITH CONTRAST TECHNIQUE: Multidetector CT imaging of the abdomen and pelvis was performed using the standard protocol following bolus administration of intravenous contrast. CONTRAST:  75mL OMNIPAQUE IOHEXOL 300 MG/ML  SOLN COMPARISON:  01/07/2021 FINDINGS: Lower chest: No acute pleural or parenchymal lung disease. Hepatobiliary: Interval progression of the gallbladder wall thickening and pericholecystic fluid seen on prior ultrasound. Additionally, periportal edema has developed. No focal liver parenchymal abnormalities. No biliary dilation. Pancreas: Unremarkable. No pancreatic ductal dilatation or surrounding inflammatory changes. Spleen: Stable splenomegaly without focal parenchymal abnormality. Adrenals/Urinary Tract: Adrenal glands are unremarkable. Kidneys are normal, without renal calculi, focal lesion,  or hydronephrosis. Bladder is unremarkable. Stomach/Bowel: No bowel obstruction or ileus. Normal appendix right lower quadrant. No bowel wall thickening or inflammatory change. Vascular/Lymphatic: No significant vascular findings are present. Stable retroperitoneal lymphadenopathy, largest lymph node in the left para-aortic region measuring up to 21 x 11 mm. Stable borderline enlarged bilateral inguinal lymph nodes. Reproductive: Uterus and bilateral adnexa are unremarkable. Other: There is a small amount of ascites within the lower pelvis, increased in volume since prior study. No free intraperitoneal gas. No abdominal wall hernia. Musculoskeletal: No acute or destructive bony lesions. Reconstructed images demonstrate no additional findings. IMPRESSION: 1. Progressive gallbladder wall thickening and pericholecystic fluid, with development of periportal edema. Findings could reflect acalculous cholecystitis, progressive hepatic dysfunction, or hepatitis. 2. Splenomegaly and retroperitoneal lymphadenopathy, not appreciably changed. Findings could be reactive and related to underlying infection or due to lymphoproliferative disorder. 3. Small volume ascites within the lower pelvis, increased in volume since prior study. Electronically Signed   By: Sharlet Salina M.D.   On: 01/11/2021 21:43   DG CHEST PORT 1 VIEW  Result Date: 01/13/2021 CLINICAL DATA:  20 y/o female inpatient with chest pain that started on April 30th per patient Pt denies being a smoker Pt denies SOB Pt states pain in midline upper chest Hx of Asthma EXAM: PORTABLE CHEST 1 VIEW COMPARISON:  None. FINDINGS: Normal mediastinum and cardiac silhouette. Normal pulmonary vasculature. No evidence of effusion, infiltrate, or pneumothorax. No acute  bony abnormality. IMPRESSION: Normal chest radiograph Electronically Signed   By: Genevive Bi M.D.   On: 01/13/2021 14:33   ECHO TEE  Result Date: 01/12/2021    TRANSESOPHOGEAL ECHO REPORT   Patient  Name:   [facility] Date of Exam: 01/12/2021 Medical Rec #:  [id number]     Height:       62.0 in Accession #:    [id number]    Weight:       135.0 lb Date of Birth:  [birth year]     BSA:          1.618 m Patient Age:    20 years      BP:           119/81 mmHg Patient Gender: F             HR:           126 bpm. Exam Location:  Inpatient Procedure: Transesophageal Echo and Color Doppler Indications:     Bacteremia  History:         Patient has no prior history of Echocardiogram examinations,                  most recent 01/10/2021.  Sonographer:     Ross Ludwig RDCS (AE) Referring Phys:  [id number] Manson Passey Diagnosing Phys: Donato Schultz MD PROCEDURE: After discussion of the risks and benefits of a TEE, an informed consent was obtained from the patient. The transesophogeal probe was passed without difficulty through the esophogus of the patient. Sedation performed by performing physician. Patients was under conscious sedation during this procedure. Anesthetic administered: of Fentanyl, 4.0mg  of Versed. Image quality was good. The patient's vital signs; including heart rate, blood pressure, and oxygen saturation; remained stable throughout the procedure. The patient developed no complications during the procedure. IMPRESSIONS  1. Left ventricular ejection fraction, by estimation, is 60 to 65%. The left ventricle has normal function. The left ventricle has no regional wall motion abnormalities.  2. Right ventricular systolic function is normal. The right ventricular size is normal.  3. No left atrial/left atrial appendage thrombus was detected.  4. The mitral valve is normal in structure. No evidence of mitral valve regurgitation. No evidence of mitral stenosis.  5. Normal tricuspid valve. No evidence of vegetation.  6. The aortic valve is normal in structure. Aortic valve regurgitation is not visualized. No aortic stenosis is present.  7. The inferior vena cava is normal in size with greater than 50%  respiratory variability, suggesting right atrial pressure of 3 mmHg. Conclusion(s)/Recommendation(s): Normal biventricular function without evidence of hemodynamically significant valvular heart disease. No evidence of vegetation/infective endocarditis on this transesophageal echocardiogram. FINDINGS  Left Ventricle: Left ventricular ejection fraction, by estimation, is 60 to 65%. The left ventricle has normal function. The left ventricle has no regional wall motion abnormalities. The left ventricular internal cavity size was normal in size. There is  no left ventricular hypertrophy. Right Ventricle: The right ventricular size is normal. No increase in right ventricular wall thickness. Right ventricular systolic function is normal. Left Atrium: Left atrial size was normal in size. No left atrial/left atrial appendage thrombus was detected. Right Atrium: Right atrial size was normal in size. Pericardium: There is no evidence of pericardial effusion. Mitral Valve: The mitral valve is normal in structure. No evidence of mitral valve regurgitation. No evidence of mitral valve stenosis. Tricuspid Valve: Normal tricuspid valve. No evidence of vegetation. The tricuspid valve is normal in structure. Tricuspid valve  regurgitation is not demonstrated. No evidence of tricuspid stenosis. Aortic Valve: The aortic valve is normal in structure. Aortic valve regurgitation is not visualized. No aortic stenosis is present. Pulmonic Valve: The pulmonic valve was normal in structure. Pulmonic valve regurgitation is not visualized. No evidence of pulmonic stenosis. Aorta: The aortic root is normal in size and structure. Venous: The inferior vena cava is normal in size with greater than 50% respiratory variability, suggesting right atrial pressure of 3 mmHg. IAS/Shunts: No atrial level shunt detected by color flow Doppler. Donato Schultz MD Electronically signed by Donato Schultz MD Signature Date/Time: 01/12/2021/3:52:02 PM    Final        Assessment/Plan:  INTERVAL HISTORY:  TEE negative  Principal Problem:   DRESS syndrome Active Problems:   Intractable nausea and vomiting   Intractable pain   Pelvic pain   Fever   Splenomegaly   Elevated LFTs   Epigastric pain   Left flank pain   Rash   Endocarditis of tricuspid valve   Transaminitis   Bipolar affective disorder, current episode mixed (HCC)   Attention deficit disorder    Oliana Gowens is a 20 y.o. female with admission with abdominal pain pelvic pain and high fevers now with a rash.  The scan had shown subtle splenomegaly and some retroperitoneal lymph nodes.  She also had some mild transaminitis on her liver function test.  2D echocardiogram shows possible vegetation on tricuspid valve. Lfts t continue to worsen.  HIDA scan was not capable of definitively ruling out cholecystitis.   Patient now appears to have had DRESS from lamictal which has been soft with starting steroids yesterday.  TEE failed to show any evidence of vegetation  #1 Fever unknown origin:   seems to be DRESS from lamictal  This is not an Infectious Disease process  #2 DRESS  Lamictal has been stopped and steroids initiated and increased today  Rash is worse and shed does have mucusal involvement with rash but she is feeling better, less abdominal pain.   Continue high-dose corticosteroids and may need to CONSIDER IVIG, would recommend talk with Dermatology at [facility]  Would not introduce new medications  If she were to worsen would recommend transfer to [facility]  #3 possible tricuspid valve vegetation seen on 2D echocardiogram she is getting a TEE today  TEE clean  #4 Chest pain: could be a myocarditis from DRESS (I hope not as per uptodate can be poor prognostic sign in some patients) . EKG has been done CXR clear  And that there is not an infectious disease driving this process I do not think that my help is needed anymore.  I will sign off for now please  call further questions I given her my card.  I spent greater than 35 minutes with the patient including greater than 50% of time in face to face counsel of the patient , mother and sister at the bedside and in coordination of her care.   She is not quite capable hands with Dr. Lowell Guitar     .   LOS: 4 days   Acey Lav 01/13/2021, 4:59 PM

## 2021-01-13 NOTE — Progress Notes (Signed)
Alexandra Young is having tough time with itching.  She has a lot of itching.  I will try her on some Atarax..  She also has little bit of Candida.  I think some Diflucan would not be a bad idea.  She had a transesophageal echocardiogram.  I do not see results back.  The likely diagnosis for his DRESS.  She says her pain is doing much better.  Her labs show white cell count 16.9.  Hemoglobin 11.2.  Platelet count 255,000.  Her LFTs are improving.  I still do not see any obvious hematologic malignancy.  We will still hold on a bone marrow test on her.  Hopefully the rash and itching will improve.  The rash is quite prominent.  She really has had a bad reaction to Lamictal.  She was on this for about a month.  Hopefully, there will be any kind of gallbladder issue with her.  I know this is a very complicated situation.  I appreciate the outstanding care she is getting from all the staff up on [address].  Christin Bach, MD  Psalms [phone]

## 2021-01-13 NOTE — Progress Notes (Addendum)
PROGRESS NOTE    Alexandra Young  YNW:[id number] DOB: March 11, 2001 DOA: 01/07/2021 PCP: Aretha Parrot, NP    Chief Complaint  Patient presents with  . Pelvic Pain  . Fever    Brief Narrative:  Alexandra Young is Alexandra Young 20 y.o. female with Adhya Cocco history of IBS and anxiety. She presented secondary to left flank pain. She was found to have significant fevers and non-specific findings on CT scan including Chayden Garrelts prominent spleen and retroperitoneal lymph nodes.  She's continued to have intermittent fevers and abdominal pain with unclear etiology.   Assessment & Plan:   Principal Problem:   DRESS syndrome Active Problems:   Intractable nausea and vomiting   Intractable pain   Pelvic pain   Fever   Splenomegaly   Elevated LFTs   Epigastric pain   Left flank pain   Rash   Endocarditis of tricuspid valve   Transaminitis   Bipolar affective disorder, current episode mixed (HCC)   Attention deficit disorder  Concern for DRESS?  Diffuse Maculopapular Rash Acute Liver Injury  Eosinophilia  Pruritis  Likely unifying diagnosis Started on lamictal about 1.5 months ago during psychiatric hospitalization Lamictal is high risk drug for dress Pathologist smear review -> lymphocytosis, monocytosis and eosinophilia. Absolute eosinophils 1.5 today LFT's improved -> normal platelets.  Bili 1.  Albumin 2.9.  No signs of encephalopathy. Consider punch biopsy Stop lamictal, follow.  Increase to IV steroids.  LFT's improving, worsening eosinophilia.  Some mucosal involvement today of rash -> discussed with dermatology at [facility] -> noted sounds typical for DRESS (they noted 50% dress can have mucosal involvement) - recommended continue current plan of care and call back as needed if worsening.  Follow response.    Chest Discomfort Mostly reproduced with palpation  She notes this has been intermittent since admission CXR normal  EKG with sinus tach, appears similar to priors Will obtain troponin as well TEE  yesterday with normal EF, no RWMA (see report)  Fever of unknown origin Recurrent fever 5/4 PM Unrevealing workup so far -> DRESS seems to be unifying diagnosis HIDA scan today suggestive of hepatocellular dysfunction, hepatitis -> equivocal study in evaluation for chronic cholecystitis/biliary dyskinesia RUQ [place] with gallbladder thickenign CT chest with trace R effusion CT abdomen/pelvis without acute abnormality Prominent retroperitoneal LN along L side of aorta Pelvic US with 1.8 cm L ovarian dominant follicle, no evidence torsion Parvovirus B19 IgG positive Ehrlichia ab panel negative RMSF studies negative ANA negative RF negative RPR negative EBV ab panel suggestive of past infection CMV IgG positive, negative IgM Acute hepatitis panel negative Negative HIV RNA quant Histoplasma antigen pending  Echo with possible vegetation on tricuspid valve -> follow TEE (normal EF, no RWMA, no evidence of vegetation/infective endocarditis - see report) Repeat CT abdomen pelvis 5/4 with gallbladder wall thickening and pericholecystic fluid with development of periportal edema (acalculous cholecystitis, hepatic dysfunction, or hepatitis), spenomegaly and retroperitoneal LAD, small volume ascites  Abdominal Pain Follow repeat CT scan today as above Follow repeat lipase, mildly elevated Pain management  Bacterial Vaginosis Flagyl   Prominent Spleen Retroperitoneal Lymphadenopathy Heme onc was considering bone marrow bx, with alternative diagnoses above, I think reasonable to continue to hold off on this  SPEP without monoclonal protein IgM, IgA, IgG wnl Beta 2 microglobulin wnl  Anxiety  Depression Holding lamictal with above Buspar As holding lamictal, will c/s psych to discuss alternative meds -> abilify  IBS   Allergies Singulair, claritin  Hypokalemia Replace and follow  DVT prophylaxis: lovenox  Code Status: full  Family Communication: none at bedside - mother over  phone Disposition:   Status is: Inpatient  Remains inpatient appropriate because:Inpatient level of care appropriate due to severity of illness   Dispo: The patient is from: Home              Anticipated d/c is to: Home              Patient currently is not medically stable to d/c.   Difficult to place patient No   Consultants:   ID  Cardiology  Procedures Echo IMPRESSIONS    1. Left ventricular ejection fraction, by estimation, is 60 to 65%. The  left ventricle has normal function. The left ventricle has no regional  wall motion abnormalities. Left ventricular diastolic parameters were  normal.  2. Right ventricular systolic function is normal. The right ventricular  size is normal.  3. The mitral valve is normal in structure. No evidence of mitral valve  regurgitation. No evidence of mitral stenosis.  4. Possible vegetation on tricuspid valve, 1.2 x 0.4cm mobile, septal  leaflet. Consider TEE for further clarification. The tricuspid valve is  abnormal.  5. The aortic valve is normal in structure. Aortic valve regurgitation is  not visualized. No aortic stenosis is present.  6. The inferior vena cava is normal in size with greater than 50%  respiratory variability, suggesting right atrial pressure of 3 mmHg.  Antimicrobials:  Anti-infectives (From admission, onward)   Start     Dose/Rate Route Frequency Ordered Stop   01/13/21 1000  fluconazole (DIFLUCAN) tablet 150 mg        150 mg Oral Daily 01/13/21 0709 01/16/21 0959   01/13/21 1000  metroNIDAZOLE (FLAGYL) tablet 500 mg        500 mg Oral 2 times daily 01/13/21 0833 01/20/21 0959   01/10/21 1645  fluconazole (DIFLUCAN) tablet 150 mg        150 mg Oral  Once 01/10/21 1554 01/10/21 1641   01/07/21 0715  cefTRIAXone (ROCEPHIN) 1 g in sodium chloride 0.9 % 100 mL IVPB        1 g 200 mL/hr over 30 Minutes Intravenous  Once 01/07/21 0710 01/07/21 0751     Subjective: Abdominal discomfort stoped after  lamictal stopped Intermittent chest discomfort - reproduced with palpation  Itching   Objective: Vitals:   01/12/21 2001 01/13/21 0042 01/13/21 0522 01/13/21 1354  BP: (!) 136/96 122/82 (!) 130/97 116/79  Pulse: 96 93 91 92  Resp: 18 16 20 16   Temp: 99.1 F (37.3 C) 98.1 F (36.7 C)  98.8 F (37.1 C)  TempSrc: Oral Oral  Oral  SpO2: 100% 100% 100% 99%  Weight:      Height:        Intake/Output Summary (Last 24 hours) at 01/13/2021 1709 Last data filed at 01/13/2021 1300 Gross per 24 hour  Intake 973.83 ml  Output --  Net 973.83 ml   Filed Weights   01/07/21 0600  Weight: 61.2 kg    Examination:  General exam: uncomfortable with itching Respiratory system: CTAB Cardiovascular system: RRR Gastrointestinal system: S/NT/ND Central nervous system: Alexandra Young&Ox3.  Moving all extremities. Extremities: no LEE Skin: diffuse morbilliform erythematous rash, most notable on torso (back, abdomen, chest), petechiae noted in mouth, vaginal involvement (no ulcerations, but petechiae/macules noted) -- no bullae, blisters, sloughing  Psychiatry: normal mood/affect    Data Reviewed: I have personally reviewed following labs and imaging studies  CBC: Recent Labs  Lab 01/09/21  [id number] 01/10/21 0257 01/11/21 0424 01/12/21 0452 01/13/21 0418  WBC 9.7 10.3 12.6* 13.9* 16.9*  NEUTROABS  --  2.7  --  4.2 5.0  HGB 11.9* 12.2 11.7* 12.5 11.2*  HCT 36.7 37.0 34.5* 36.7 33.4*  MCV 86.6 84.1 84.1 84.0 83.9  PLT 170 173 154 203 255    Basic Metabolic Panel: Recent Labs  Lab 01/09/21 0311 01/10/21 0257 01/11/21 0424 01/12/21 0452 01/13/21 0418  NA 135 135 139 137 138  K 3.6 3.6 4.0 3.2* 3.8  CL 111 110 108 105 104  CO2 12* 16* 20* 23 23  GLUCOSE 71 120* 119* 105* 116*  BUN <5* <5* <5* <5* 10  CREATININE 0.71 0.56 0.42* 0.51 0.56  CALCIUM 8.3* 8.3* 8.0* 8.1* 8.3*  MG  --   --   --  2.2 2.2  PHOS  --   --   --  2.9 2.6    GFR: Estimated Creatinine Clearance: 95.7 mL/min (by C-G  formula based on SCr of 0.56 mg/dL).  Liver Function Tests: Recent Labs  Lab 01/09/21 0311 01/10/21 0257 01/11/21 0424 01/12/21 0452 01/13/21 0418  AST 42* 73* 124* 146* 114*  ALT 48* 89* 142* 227* 190*  ALKPHOS 83 131* 108 148* 156*  BILITOT 1.0 0.6 1.2 1.4* 1.0  PROT 6.6 6.7 6.0* 6.2* 6.0*  ALBUMIN 3.4* 3.4* 3.1* 3.0* 2.9*    CBG: No results for input(s): GLUCAP in the last 168 hours.   Recent Results (from the past 240 hour(s))  Resp Panel by RT-PCR (Flu Meganne Rita&B, Covid) Nasopharyngeal Swab     Status: None   Collection Time: 01/04/21  4:24 PM   Specimen: Nasopharyngeal Swab; Nasopharyngeal(NP) swabs in vial transport medium  Result Value Ref Range Status   SARS Coronavirus 2 by RT PCR NEGATIVE NEGATIVE Final    Comment: (NOTE) SARS-CoV-2 target nucleic acids are NOT DETECTED.  The SARS-CoV-2 RNA is generally detectable in upper respiratory specimens during the acute phase of infection. The lowest concentration of SARS-CoV-2 viral copies this assay can detect is 138 copies/mL. Jourdan Maldonado negative result does not preclude SARS-Cov-2 infection and should not be used as the sole basis for treatment or other patient management decisions. Zakayla Martinec negative result may occur with  improper specimen collection/handling, submission of specimen other than nasopharyngeal swab, presence of viral mutation(s) within the areas targeted by this assay, and inadequate number of viral copies(<138 copies/mL). Auden Wettstein negative result must be combined with clinical observations, patient history, and epidemiological information. The expected result is Negative.  Fact Sheet for Patients:  BloggerCourse.comhttps://www.fda.gov/media/152166/download  Fact Sheet for Healthcare Providers:  SeriousBroker.ithttps://www.fda.gov/media/152162/download  This test is no t yet approved or cleared by the [place] FDA and  has been authorized for detection and/or diagnosis of SARS-CoV-2 by FDA under an Emergency Use Authorization (EUA). This EUA will  remain  in effect (meaning this test can be used) for the duration of the COVID-19 declaration under Section 564(b)(1) of the Act, 21 U.S.C.section 360bbb-3(b)(1), unless the authorization is terminated  or revoked sooner.       Influenza Jameela Michna by PCR NEGATIVE NEGATIVE Final   Influenza B by PCR NEGATIVE NEGATIVE Final    Comment: (NOTE) The Xpert Xpress SARS-CoV-2/FLU/RSV plus assay is intended as an aid in the diagnosis of influenza from Nasopharyngeal swab specimens and should not be used as Nuvia Hileman sole basis for treatment. Nasal washings and aspirates are unacceptable for Xpert Xpress SARS-CoV-2/FLU/RSV testing.  Fact Sheet for Patients: BloggerCourse.comhttps://www.fda.gov/media/152166/download  Fact Sheet for Healthcare  Providers: SeriousBroker.it  This test is not yet approved or cleared by the [place] and has been authorized for detection and/or diagnosis of SARS-CoV-2 by FDA under an Emergency Use Authorization (EUA). This EUA will remain in effect (meaning this test can be used) for the duration of the COVID-19 declaration under Section 564(b)(1) of the Act, 21 U.S.C. section 360bbb-3(b)(1), unless the authorization is terminated or revoked.  Performed at [facility], [address], [place], [place] [address]   Wet prep, genital     Status: Abnormal   Collection Time: 01/04/21  7:09 PM   Specimen: PATH Cytology Cervicovaginal Ancillary Only  Result Value Ref Range Status   Yeast Wet Prep HPF POC NONE SEEN NONE SEEN Final   Trich, Wet Prep NONE SEEN NONE SEEN Final   Clue Cells Wet Prep HPF POC PRESENT (Admiral Marcucci) NONE SEEN Final   WBC, Wet Prep HPF POC MANY (Margerie Fraiser) NONE SEEN Final   Sperm NONE SEEN  Final    Comment: Performed at [facility], [address] [place], [place] [address]  Urine culture     Status: Abnormal   Collection Time: 01/07/21  9:32 AM   Specimen: Urine, Catheterized  Result Value Ref Range Status    Specimen Description   Final    URINE, CATHETERIZED Performed at [facility], [address] [place], [place] [address]    Special Requests   Final    NONE Performed at [facility], [address] [place], [place] [address]    Culture (Laith Antonelli)  Final    <10,000 COLONIES/mL INSIGNIFICANT GROWTH Performed at [facility], [address] [place], [place] [address]    Report Status 01/08/2021 FINAL  Final  Culture, blood (routine x 2)     Status: None   Collection Time: 01/07/21  1:39 PM   Specimen: BLOOD RIGHT HAND  Result Value Ref Range Status   Specimen Description   Final    BLOOD RIGHT HAND Performed at [facility], [address] [place], [place] [address]    Special Requests   Final    BOTTLES DRAWN AEROBIC ONLY Blood Culture adequate volume Performed at [facility], [address] [place], [place] [address]    Culture   Final    NO GROWTH 5 DAYS Performed at [facility], [address], [place], [place] [address]    Report Status 01/12/2021 FINAL  Final  Culture, blood (routine x 2)     Status: None   Collection Time: 01/07/21  1:39 PM   Specimen: BLOOD LEFT HAND  Result Value Ref Range Status   Specimen Description   Final    BLOOD LEFT HAND Performed at [facility], [address], [place], [place] [address]    Special Requests   Final    BOTTLES DRAWN AEROBIC ONLY Blood Culture results may not be optimal due to an inadequate volume of blood received in culture bottles Performed at [facility], [address], [place], [place] [address]    Culture   Final    NO GROWTH 5 DAYS Performed at [facility], [address] [place], [place] [address]    Report Status 01/12/2021 FINAL  Final  Blastomyces Antigen     Status: None   Collection Time: 01/07/21  6:45 PM  Result Value Ref Range Status   Blastomyces Antigen None Detected  None  Detected ng/mL Final    Comment: (NOTE) Results reported as ng/mL in 0.2 - 14.7 ng/mL range Results above the limit of detection but below 0.2 ng/mL are reported as 'Positive, Below the Limit of Quantification' Results above 14.7 ng/mL are reported as 'Positive, Above the Limit of Quantification'    Specimen Type SERUM  Final    Comment: (NOTE) Performed At: [facility] [address] [place], [place] [id number] Phylis Bougie MD WU:[id number]          Radiology Studies: CT ABDOMEN PELVIS W CONTRAST  Result Date: 01/11/2021 CLINICAL DATA:  Abdominal pain, fever, rash EXAM: CT ABDOMEN AND PELVIS WITH CONTRAST TECHNIQUE: Multidetector CT imaging of the abdomen and pelvis was performed using the standard protocol following bolus administration of intravenous contrast. CONTRAST:  75mL OMNIPAQUE IOHEXOL 300 MG/ML  SOLN COMPARISON:  01/07/2021 FINDINGS: Lower chest: No acute pleural or parenchymal lung disease. Hepatobiliary: Interval progression of the gallbladder wall thickening and pericholecystic fluid seen on prior ultrasound. Additionally, periportal edema has developed. No focal liver parenchymal abnormalities. No biliary dilation. Pancreas: Unremarkable. No pancreatic ductal dilatation or surrounding inflammatory changes. Spleen: Stable splenomegaly without focal parenchymal abnormality. Adrenals/Urinary Tract: Adrenal glands are unremarkable. Kidneys are normal, without renal calculi, focal lesion, or hydronephrosis. Bladder is unremarkable. Stomach/Bowel: No bowel obstruction or ileus. Normal appendix right lower quadrant. No bowel wall thickening or inflammatory change. Vascular/Lymphatic: No significant vascular findings are present. Stable retroperitoneal lymphadenopathy, largest lymph node in the left para-aortic region measuring up to 21 x 11 mm. Stable borderline enlarged bilateral inguinal lymph nodes. Reproductive: Uterus and bilateral adnexa are unremarkable.  Other: There is Eyana Stolze small amount of ascites within the lower pelvis, increased in volume since prior study. No free intraperitoneal gas. No abdominal wall hernia. Musculoskeletal: No acute or destructive bony lesions. Reconstructed images demonstrate no additional findings. IMPRESSION: 1. Progressive gallbladder wall thickening and pericholecystic fluid, with development of periportal edema. Findings could reflect acalculous cholecystitis, progressive hepatic dysfunction, or hepatitis. 2. Splenomegaly and retroperitoneal lymphadenopathy, not appreciably changed. Findings could be reactive and related to underlying infection or due to lymphoproliferative disorder. 3. Small volume ascites within the lower pelvis, increased in volume since prior study. Electronically Signed   By: Sharlet Salina M.D.   On: 01/11/2021 21:43   DG CHEST PORT 1 VIEW  Result Date: 01/13/2021 CLINICAL DATA:  20 y/o female inpatient with chest pain that started on April 30th per patient Pt denies being Maizy Davanzo smoker Pt denies SOB Pt states pain in midline upper chest Hx of Asthma EXAM: PORTABLE CHEST 1 VIEW COMPARISON:  None. FINDINGS: Normal mediastinum and cardiac silhouette. Normal pulmonary vasculature. No evidence of effusion, infiltrate, or pneumothorax. No acute bony abnormality. IMPRESSION: Normal chest radiograph Electronically Signed   By: Genevive Bi M.D.   On: 01/13/2021 14:33   ECHO TEE  Result Date: 01/12/2021    TRANSESOPHOGEAL ECHO REPORT   Patient Name:   [facility] Date of Exam: 01/12/2021 Medical Rec #:  [id number]     Height:       62.0 in Accession #:    [id number]    Weight:       135.0 lb Date of Birth:  2001/01/17     BSA:          1.618 m Patient Age:    20 years      BP:           119/81 mmHg Patient Gender: F  HR:           126 bpm. Exam Location:  Inpatient Procedure: Transesophageal Echo and Color Doppler Indications:     Bacteremia  History:         Patient has no prior history of Echocardiogram  examinations,                  most recent 01/10/2021.  Sonographer:     Ross Ludwig RDCS (AE) Referring Phys:  [id number] Manson Passey Diagnosing Phys: Donato Schultz MD PROCEDURE: After discussion of the risks and benefits of Danijah Noh TEE, an informed consent was obtained from the patient. The transesophogeal probe was passed without difficulty through the esophogus of the patient. Sedation performed by performing physician. Patients was under conscious sedation during this procedure. Anesthetic administered: of Fentanyl, 4.0mg  of Versed. Image quality was good. The patient's vital signs; including heart rate, blood pressure, and oxygen saturation; remained stable throughout the procedure. The patient developed no complications during the procedure. IMPRESSIONS  1. Left ventricular ejection fraction, by estimation, is 60 to 65%. The left ventricle has normal function. The left ventricle has no regional wall motion abnormalities.  2. Right ventricular systolic function is normal. The right ventricular size is normal.  3. No left atrial/left atrial appendage thrombus was detected.  4. The mitral valve is normal in structure. No evidence of mitral valve regurgitation. No evidence of mitral stenosis.  5. Normal tricuspid valve. No evidence of vegetation.  6. The aortic valve is normal in structure. Aortic valve regurgitation is not visualized. No aortic stenosis is present.  7. The inferior vena cava is normal in size with greater than 50% respiratory variability, suggesting right atrial pressure of 3 mmHg. Conclusion(s)/Recommendation(s): Normal biventricular function without evidence of hemodynamically significant valvular heart disease. No evidence of vegetation/infective endocarditis on this transesophageal echocardiogram. FINDINGS  Left Ventricle: Left ventricular ejection fraction, by estimation, is 60 to 65%. The left ventricle has normal function. The left ventricle has no regional wall motion abnormalities. The  left ventricular internal cavity size was normal in size. There is  no left ventricular hypertrophy. Right Ventricle: The right ventricular size is normal. No increase in right ventricular wall thickness. Right ventricular systolic function is normal. Left Atrium: Left atrial size was normal in size. No left atrial/left atrial appendage thrombus was detected. Right Atrium: Right atrial size was normal in size. Pericardium: There is no evidence of pericardial effusion. Mitral Valve: The mitral valve is normal in structure. No evidence of mitral valve regurgitation. No evidence of mitral valve stenosis. Tricuspid Valve: Normal tricuspid valve. No evidence of vegetation. The tricuspid valve is normal in structure. Tricuspid valve regurgitation is not demonstrated. No evidence of tricuspid stenosis. Aortic Valve: The aortic valve is normal in structure. Aortic valve regurgitation is not visualized. No aortic stenosis is present. Pulmonic Valve: The pulmonic valve was normal in structure. Pulmonic valve regurgitation is not visualized. No evidence of pulmonic stenosis. Aorta: The aortic root is normal in size and structure. Venous: The inferior vena cava is normal in size with greater than 50% respiratory variability, suggesting right atrial pressure of 3 mmHg. IAS/Shunts: No atrial level shunt detected by color flow Doppler. Donato Schultz MD Electronically signed by Donato Schultz MD Signature Date/Time: 01/12/2021/3:52:02 PM    Final         Scheduled Meds: . ARIPiprazole  2 mg Oral Once  . ARIPiprazole  5 mg Oral Daily  . busPIRone  10 mg Oral BID  .  enoxaparin (LOVENOX) injection  40 mg Subcutaneous Q24H  . fluconazole  150 mg Oral Daily  . loratadine  10 mg Oral Daily  . methylPREDNISolone (SOLU-MEDROL) injection  60 mg Intravenous Q12H  . metroNIDAZOLE  500 mg Oral BID  . montelukast  10 mg Oral QHS  . ondansetron (ZOFRAN) IV  4 mg Intravenous Q8H   Or  . ondansetron  4 mg Oral Q8H  . pantoprazole  40  mg Oral Daily  . sodium chloride flush  10-40 mL Intracatheter Q12H  . triamcinolone cream   Topical BID   Continuous Infusions:    LOS: 4 days    Time spent: over 30 min    Lacretia Nicks, MD [facility]   To contact the attending provider between 7A-7P or the covering provider during after hours 7P-7A, please log into the web site [email or web address] and access using universal [place] password for that web site. If you do not have the password, please call the hospital operator.  01/13/2021, 5:09 PM

## 2021-01-14 DIAGNOSIS — D7212 Drug rash with eosinophilia and systemic symptoms syndrome: Secondary | ICD-10-CM | POA: Diagnosis not present

## 2021-01-14 DIAGNOSIS — T50905A Adverse effect of unspecified drugs, medicaments and biological substances, initial encounter: Secondary | ICD-10-CM | POA: Diagnosis not present

## 2021-01-14 LAB — CBC WITH DIFFERENTIAL/PLATELET
Band Neutrophils: 6 %
Basophils Relative: 0 %
Blasts: NONE SEEN %
Eosinophils Relative: 0 %
HCT: 32.6 % — ABNORMAL LOW (ref 36.0–46.0)
Hemoglobin: 10.9 g/dL — ABNORMAL LOW (ref 12.0–15.0)
Lymphocytes Relative: 36 %
MCH: 27.9 pg (ref 26.0–34.0)
MCHC: 33.4 g/dL (ref 30.0–36.0)
MCV: 83.6 fL (ref 80.0–100.0)
Metamyelocytes Relative: NONE SEEN %
Monocytes Relative: 1 %
Myelocytes: 1 %
Neutrophils Relative %: 56 %
Platelets: 317 10*3/uL (ref 150–400)
Promyelocytes Relative: NONE SEEN %
RBC Morphology: NORMAL
RBC: 3.9 MIL/uL (ref 3.87–5.11)
RDW: 14.3 % (ref 11.5–15.5)
WBC Morphology: NORMAL
WBC: 19.2 10*3/uL — ABNORMAL HIGH (ref 4.0–10.5)
nRBC: 0 % (ref 0.0–0.2)
nRBC: 1 /100 WBC — ABNORMAL HIGH

## 2021-01-14 LAB — COMPREHENSIVE METABOLIC PANEL
ALT: 176 U/L — ABNORMAL HIGH (ref 0–44)
AST: 60 U/L — ABNORMAL HIGH (ref 15–41)
Albumin: 3.3 g/dL — ABNORMAL LOW (ref 3.5–5.0)
Alkaline Phosphatase: 138 U/L — ABNORMAL HIGH (ref 38–126)
Anion gap: 10 (ref 5–15)
BUN: 12 mg/dL (ref 6–20)
CO2: 24 mmol/L (ref 22–32)
Calcium: 8.6 mg/dL — ABNORMAL LOW (ref 8.9–10.3)
Chloride: 109 mmol/L (ref 98–111)
Creatinine, Ser: 0.39 mg/dL — ABNORMAL LOW (ref 0.44–1.00)
GFR, Estimated: >60 mL/min (ref >60)
Glucose, Bld: 147 mg/dL — ABNORMAL HIGH (ref 70–99)
Potassium: 3.9 mmol/L (ref 3.5–5.1)
Sodium: 143 mmol/L (ref 135–145)
Total Bilirubin: 0.3 mg/dL (ref 0.3–1.2)
Total Protein: 6.7 g/dL (ref 6.5–8.1)

## 2021-01-14 LAB — PHOSPHORUS: Phosphorus: 4.6 mg/dL (ref 2.5–4.6)

## 2021-01-14 LAB — MAGNESIUM: Magnesium: 2.3 mg/dL (ref 1.7–2.4)

## 2021-01-14 MED ORDER — TRIAMCINOLONE ACETONIDE 0.5 % EX OINT
TOPICAL_OINTMENT | Freq: Three times a day (TID) | CUTANEOUS | Status: DC
  Filled 2021-01-14 (×2): qty 15

## 2021-01-14 MED ORDER — DIPHENHYDRAMINE HCL 25 MG PO CAPS
25.0000 mg | ORAL_CAPSULE | Freq: Four times a day (QID) | ORAL | Status: DC | PRN
  Administered 2021-01-19: 25 mg via ORAL
  Filled 2021-01-14: qty 1

## 2021-01-14 MED ORDER — DIPHENHYDRAMINE HCL 50 MG PO CAPS
50.0000 mg | ORAL_CAPSULE | Freq: Four times a day (QID) | ORAL | Status: DC | PRN
  Administered 2021-01-15 – 2021-01-18 (×6): 50 mg via ORAL
  Filled 2021-01-14 (×6): qty 1

## 2021-01-14 MED ORDER — ONDANSETRON HCL 4 MG/2ML IJ SOLN
4.0000 mg | Freq: Three times a day (TID) | INTRAMUSCULAR | Status: DC | PRN
  Administered 2021-01-20: 4 mg via INTRAVENOUS
  Filled 2021-01-14: qty 2

## 2021-01-14 MED ORDER — NYSTATIN 100000 UNIT/ML MT SUSP
5.0000 mL | Freq: Four times a day (QID) | OROMUCOSAL | Status: DC
  Administered 2021-01-15 – 2021-01-18 (×13): 500000 [IU] via ORAL
  Filled 2021-01-14 (×17): qty 5

## 2021-01-14 MED ORDER — METHYLPREDNISOLONE SODIUM SUCC 125 MG IJ SOLR
60.0000 mg | Freq: Four times a day (QID) | INTRAMUSCULAR | Status: DC
  Administered 2021-01-14 – 2021-01-17 (×12): 60 mg via INTRAVENOUS
  Filled 2021-01-14 (×12): qty 2

## 2021-01-14 MED ORDER — DIPHENHYDRAMINE HCL 50 MG/ML IJ SOLN
25.0000 mg | Freq: Four times a day (QID) | INTRAMUSCULAR | Status: DC | PRN
  Administered 2021-01-14 – 2021-01-20 (×5): 25 mg via INTRAVENOUS
  Filled 2021-01-14 (×5): qty 1

## 2021-01-14 MED ORDER — ONDANSETRON HCL 4 MG PO TABS
4.0000 mg | ORAL_TABLET | Freq: Three times a day (TID) | ORAL | Status: DC | PRN
  Administered 2021-01-15 – 2021-01-19 (×2): 4 mg via ORAL
  Filled 2021-01-14 (×2): qty 1

## 2021-01-14 NOTE — Progress Notes (Addendum)
PROGRESS NOTE    Alexandra Young  ZOX:[id number] DOB: August 21, 2001 DOA: 01/07/2021 PCP: Aretha Parrot, NP    Chief Complaint  Patient presents with  . Pelvic Pain  . Fever    Brief Narrative:  Alexandra Young is Alexandra Young 20 y.o. female with Alexandra Young history of IBS and anxiety. She presented secondary to left flank pain. She was found to have significant fevers and non-specific findings on CT scan including Davyd Podgorski prominent spleen and retroperitoneal lymph nodes.  She's continued to have intermittent fevers and abdominal pain with unclear etiology.   Assessment & Plan:   Principal Problem:   DRESS syndrome Active Problems:   Intractable nausea and vomiting   Intractable pain   Pelvic pain   Fever   Splenomegaly   Elevated LFTs   Epigastric pain   Left flank pain   Rash   Endocarditis of tricuspid valve   Transaminitis   Bipolar affective disorder, current episode mixed (HCC)   Attention deficit disorder  Concern for DRESS?  Diffuse Maculopapular Rash Acute Liver Injury  Eosinophilia  Pruritis  Likely unifying diagnosis Started on lamictal about 1.5 months ago during psychiatric hospitalization Lamictal is high risk drug for dress Pathologist smear review -> lymphocytosis, monocytosis and eosinophilia. Absolute eosinophils normalized LFT's improved -> normal platelets.  Bili 1.  Albumin 2.9.  No signs of encephalopathy. Consider punch biopsy Stop lamictal, follow.  IV steroids (bump dose with spreading rash).  LFT's improving, eosinophilia resolved.  Persistent oral mucosal involvement today of rash, slightly improved.  Vaginal involvement has improved/resolved -> discussed with dermatology at [facility] -> noted sounds typical for DRESS (they noted 50% dress can have mucosal involvement) - recommended continue current plan of care and call back as needed if worsening.  Follow response.    Myelocyte Noted on diff, follow pathology smear Continue to follow CBC with diff  Lymphadenopathy Likely  2/2 dress, follow with treatment  Chest Discomfort Mostly reproduced with palpation  She notes this has been intermittent since admission Resolved today CXR normal  EKG with sinus tach, appears similar to priors Will obtain troponin as well - negative TEE yesterday with normal EF, no RWMA (see report)  Fever of unknown origin Recurrent fever 5/4 PM Unrevealing workup so far -> DRESS seems to be unifying diagnosis HIDA scan today suggestive of hepatocellular dysfunction, hepatitis -> equivocal study in evaluation for chronic cholecystitis/biliary dyskinesia RUQ [place] with gallbladder thickenign CT chest with trace R effusion CT abdomen/pelvis without acute abnormality Prominent retroperitoneal LN along L side of aorta Pelvic US with 1.8 cm L ovarian dominant follicle, no evidence torsion Parvovirus B19 IgG positive Ehrlichia ab panel negative RMSF studies negative ANA negative RF negative RPR negative EBV ab panel suggestive of past infection CMV IgG positive, negative IgM Acute hepatitis panel negative Negative HIV RNA quant Histoplasma antigen pending  Echo with possible vegetation on tricuspid valve -> follow TEE (normal EF, no RWMA, no evidence of vegetation/infective endocarditis - see report) Repeat CT abdomen pelvis 5/4 with gallbladder wall thickening and pericholecystic fluid with development of periportal edema (acalculous cholecystitis, hepatic dysfunction, or hepatitis), spenomegaly and retroperitoneal LAD, small volume ascites  Abdominal Pain Follow repeat CT scan today as above Follow repeat lipase, mildly elevated Pain management  Bacterial Vaginosis Flagyl   Thrush Nystatin  Prominent Spleen Retroperitoneal Lymphadenopathy Heme onc was considering bone marrow bx, with alternative diagnoses above, I think reasonable to continue to hold off on this  SPEP without monoclonal protein IgM, IgA, IgG wnl Beta 2  microglobulin wnl  Anxiety  Depression Holding  lamictal with above Buspar As holding lamictal, will c/s psych to discuss alternative meds -> abilify  IBS   Allergies Singulair, claritin  Hypokalemia Replace and follow  DVT prophylaxis: lovenox Code Status: full  Family Communication: none at bedside - mother at bedside 5/6 Disposition:   Status is: Inpatient  Remains inpatient appropriate because:Inpatient level of care appropriate due to severity of illness   Dispo: The patient is from: Home              Anticipated d/c is to: Home              Patient currently is not medically stable to d/c.   Difficult to place patient No   Consultants:   ID  Cardiology  Procedures Echo IMPRESSIONS    1. Left ventricular ejection fraction, by estimation, is 60 to 65%. The  left ventricle has normal function. The left ventricle has no regional  wall motion abnormalities. Left ventricular diastolic parameters were  normal.  2. Right ventricular systolic function is normal. The right ventricular  size is normal.  3. The mitral valve is normal in structure. No evidence of mitral valve  regurgitation. No evidence of mitral stenosis.  4. Possible vegetation on tricuspid valve, 1.2 x 0.4cm mobile, septal  leaflet. Consider TEE for further clarification. The tricuspid valve is  abnormal.  5. The aortic valve is normal in structure. Aortic valve regurgitation is  not visualized. No aortic stenosis is present.  6. The inferior vena cava is normal in size with greater than 50%  respiratory variability, suggesting right atrial pressure of 3 mmHg.  Antimicrobials:  Anti-infectives (From admission, onward)   Start     Dose/Rate Route Frequency Ordered Stop   01/13/21 1000  fluconazole (DIFLUCAN) tablet 150 mg  Status:  Discontinued        150 mg Oral Daily 01/13/21 0709 01/14/21 1320   01/13/21 1000  metroNIDAZOLE (FLAGYL) tablet 500 mg        500 mg Oral 2 times daily 01/13/21 0833 01/20/21 0959   01/10/21 1645   fluconazole (DIFLUCAN) tablet 150 mg        150 mg Oral  Once 01/10/21 1554 01/10/21 1641   01/07/21 0715  cefTRIAXone (ROCEPHIN) 1 g in sodium chloride 0.9 % 100 mL IVPB        1 g 200 mL/hr over 30 Minutes Intravenous  Once 01/07/21 0710 01/07/21 0751     Subjective: Continued significant itching Rash worsened Pain better, feeling better  Objective: Vitals:   01/13/21 1354 01/13/21 2033 01/14/21 0511 01/14/21 1345  BP: 116/79 122/75 117/76 121/76  Pulse: 92 93 68 85  Resp: 16 18 17    Temp: 98.8 F (37.1 C) 98.9 F (37.2 C) 98.2 F (36.8 C) 99.1 F (37.3 C)  TempSrc: Oral Oral Oral Oral  SpO2: 99% 100% 99% 100%  Weight:      Height:        Intake/Output Summary (Last 24 hours) at 01/14/2021 1646 Last data filed at 01/14/2021 1100 Gross per 24 hour  Intake 240 ml  Output --  Net 240 ml   Filed Weights   01/07/21 0600  Weight: 61.2 kg    Examination:  General: No acute distress. Cervical, posterior auricular,  Cardiovascular: Heart sounds show Daissy Yerian regular rate, and rhythm.  Lungs: Clear to auscultation bilaterally  Abdomen: Soft, nontender, nondistended Neurological: Alert and oriented 3. Moves all extremities 4. Cranial nerves II  through XII grossly intact. Skin: diffuse morbilliform rash, with spreading erythema, developing plaques - blanching - involvement of palms and soles noted now as well - persistent oral involvement with areas of redness, vaginal involvement has improved.  No blisters, bullae, sloughing.  Exam 5/6 and 5/7 done in presence of female nurse Joaquin Bend). Extremities: no LEE    Data Reviewed: I have personally reviewed following labs and imaging studies  CBC: Recent Labs  Lab 01/10/21 0257 01/11/21 0424 01/12/21 0452 01/13/21 0418 01/14/21 0539  WBC 10.3 12.6* 13.9* 16.9* 19.2*  NEUTROABS 2.7  --  4.2 5.0  --   HGB 12.2 11.7* 12.5 11.2* 10.9*  HCT 37.0 34.5* 36.7 33.4* 32.6*  MCV 84.1 84.1 84.0 83.9 83.6  PLT 173 154 203 255 317     Basic Metabolic Panel: Recent Labs  Lab 01/10/21 0257 01/11/21 0424 01/12/21 0452 01/13/21 0418 01/14/21 0539  NA 135 139 137 138 143  K 3.6 4.0 3.2* 3.8 3.9  CL 110 108 105 104 109  CO2 16* 20* 23 23 24   GLUCOSE 120* 119* 105* 116* 147*  BUN <5* <5* <5* 10 12  CREATININE 0.56 0.42* 0.51 0.56 0.39*  CALCIUM 8.3* 8.0* 8.1* 8.3* 8.6*  MG  --   --  2.2 2.2 2.3  PHOS  --   --  2.9 2.6 4.6    GFR: Estimated Creatinine Clearance: 95.7 mL/min (Britney Captain) (by C-G formula based on SCr of 0.39 mg/dL (L)).  Liver Function Tests: Recent Labs  Lab 01/10/21 0257 01/11/21 0424 01/12/21 0452 01/13/21 0418 01/14/21 0539  AST 73* 124* 146* 114* 60*  ALT 89* 142* 227* 190* 176*  ALKPHOS 131* 108 148* 156* 138*  BILITOT 0.6 1.2 1.4* 1.0 0.3  PROT 6.7 6.0* 6.2* 6.0* 6.7  ALBUMIN 3.4* 3.1* 3.0* 2.9* 3.3*    CBG: No results for input(s): GLUCAP in the last 168 hours.   Recent Results (from the past 240 hour(s))  Wet prep, genital     Status: Abnormal   Collection Time: 01/04/21  7:09 PM   Specimen: PATH Cytology Cervicovaginal Ancillary Only  Result Value Ref Range Status   Yeast Wet Prep HPF POC NONE SEEN NONE SEEN Final   Trich, Wet Prep NONE SEEN NONE SEEN Final   Clue Cells Wet Prep HPF POC PRESENT (Judene Logue) NONE SEEN Final   WBC, Wet Prep HPF POC MANY (Corry Ihnen) NONE SEEN Final   Sperm NONE SEEN  Final    Comment: Performed at [facility], [address], [place], [place]  Urine culture     Status: Abnormal   Collection Time: 01/07/21  9:32 AM   Specimen: Urine, Catheterized  Result Value Ref Range Status   Specimen Description   Final    URINE, CATHETERIZED Performed at [facility], [address], [place], [place]    Special Requests   Final    NONE Performed at [facility], [address], [place], [place]    Culture (Auston Halfmann)  Final    <10,000 COLONIES/mL INSIGNIFICANT GROWTH Performed at [facility], [address] [place], [place]    Report Status 01/08/2021 FINAL  Final  Culture, blood (routine x 2)     Status: None   Collection Time: 01/07/21  1:39 PM   Specimen: BLOOD RIGHT HAND  Result Value Ref Range Status   Specimen Description   Final    BLOOD RIGHT HAND Performed at  [facility], [address] [place], [place] [address]    Special Requests   Final    BOTTLES DRAWN AEROBIC ONLY Blood Culture adequate volume Performed at [facility], [address] [place], [place] [address]    Culture   Final    NO GROWTH 5 DAYS Performed at [facility], [address] [place], [place] [address]    Report Status 01/12/2021 FINAL  Final  Culture, blood (routine x 2)     Status: None   Collection Time: 01/07/21  1:39 PM   Specimen: BLOOD LEFT HAND  Result Value Ref Range Status   Specimen Description   Final    BLOOD LEFT HAND Performed at [facility], [address] [place], [place] [address]    Special Requests   Final    BOTTLES DRAWN AEROBIC ONLY Blood Culture results may not be optimal due to an inadequate volume of blood received in culture bottles Performed at [facility], [address] [place], [place] [address]    Culture   Final    NO GROWTH 5 DAYS Performed at [facility], [address] [place], [place] [address]    Report Status 01/12/2021 FINAL  Final  Blastomyces Antigen     Status: None   Collection Time: 01/07/21  6:45 PM  Result Value Ref Range Status   Blastomyces Antigen None Detected None Detected ng/mL Final    Comment: (NOTE) Results reported as ng/mL in 0.2 - 14.7 ng/mL range Results above the limit of detection but below 0.2 ng/mL are reported as 'Positive, Below the Limit of Quantification' Results above 14.7 ng/mL are reported as 'Positive, Above the Limit of Quantification'    Specimen Type SERUM  Final    Comment:  (NOTE) Performed At: [facility] [address] [place], [place] [id number] Phylis BougieBlue Deborah E MD BM:[id number]:[phone]          Radiology Studies: DG CHEST PORT 1 VIEW  Result Date: 01/13/2021 CLINICAL DATA:  20 y/o female inpatient with chest pain that started on April 30th per patient Pt denies being Anagha Loseke smoker Pt denies SOB Pt states pain in midline upper chest Hx of Asthma EXAM: PORTABLE CHEST 1 VIEW COMPARISON:  None. FINDINGS: Normal mediastinum and cardiac silhouette. Normal pulmonary vasculature. No evidence of effusion, infiltrate, or pneumothorax. No acute bony abnormality. IMPRESSION: Normal chest radiograph Electronically Signed   By: Genevive BiStewart  Edmunds M.D.   On: 01/13/2021 14:33        Scheduled Meds: . ARIPiprazole  2 mg Oral Once  . ARIPiprazole  5 mg Oral Daily  . busPIRone  10 mg Oral BID  . enoxaparin (LOVENOX) injection  40 mg Subcutaneous Q24H  . loratadine  10 mg Oral Daily  . methylPREDNISolone (SOLU-MEDROL) injection  60 mg Intravenous Q12H  . metroNIDAZOLE  500 mg Oral BID  . montelukast  10 mg Oral QHS  . [START ON 01/15/2021] nystatin  5 mL Oral QID  . pantoprazole  40 mg Oral Daily  . sodium chloride flush  10-40 mL Intracatheter Q12H  . triamcinolone ointment   Topical TID   Continuous Infusions:    LOS: 5 days    Time spent: over 30 min    Lacretia Nicksaldwell Powell, MD [facility]   To contact the attending provider between 7A-7P or the covering provider during after hours 7P-7A, please log into the web site [email or web address] and access using universal [place]  password for that web site. If you do not have the password, please call the hospital operator.  01/14/2021, 4:46 PM

## 2021-01-15 DIAGNOSIS — D7212 Drug rash with eosinophilia and systemic symptoms syndrome: Secondary | ICD-10-CM | POA: Diagnosis not present

## 2021-01-15 DIAGNOSIS — T50905A Adverse effect of unspecified drugs, medicaments and biological substances, initial encounter: Secondary | ICD-10-CM | POA: Diagnosis not present

## 2021-01-15 LAB — CBC WITH DIFFERENTIAL/PLATELET
Abs Immature Granulocytes: 0.56 10*3/uL — ABNORMAL HIGH (ref 0.00–0.07)
Basophils Absolute: 0.2 10*3/uL — ABNORMAL HIGH (ref 0.0–0.1)
Basophils Relative: 1 %
Eosinophils Absolute: 0.3 10*3/uL (ref 0.0–0.5)
Eosinophils Relative: 1 %
HCT: 34.5 % — ABNORMAL LOW (ref 36.0–46.0)
Hemoglobin: 11.6 g/dL — ABNORMAL LOW (ref 12.0–15.0)
Immature Granulocytes: 2 %
Lymphocytes Relative: 36 %
Lymphs Abs: 8.4 10*3/uL — ABNORMAL HIGH (ref 0.7–4.0)
MCH: 28.4 pg (ref 26.0–34.0)
MCHC: 33.6 g/dL (ref 30.0–36.0)
MCV: 84.6 fL (ref 80.0–100.0)
Monocytes Absolute: 2.7 10*3/uL — ABNORMAL HIGH (ref 0.1–1.0)
Monocytes Relative: 12 %
Neutro Abs: 11.5 10*3/uL — ABNORMAL HIGH (ref 1.7–7.7)
Neutrophils Relative %: 48 %
Platelets: 416 10*3/uL — ABNORMAL HIGH (ref 150–400)
RBC: 4.08 MIL/uL (ref 3.87–5.11)
RDW: 14.4 % (ref 11.5–15.5)
WBC: 23.7 10*3/uL — ABNORMAL HIGH (ref 4.0–10.5)
nRBC: 0 % (ref 0.0–0.2)

## 2021-01-15 LAB — COMPREHENSIVE METABOLIC PANEL
ALT: 168 U/L — ABNORMAL HIGH (ref 0–44)
AST: 41 U/L (ref 15–41)
Albumin: 3.6 g/dL (ref 3.5–5.0)
Alkaline Phosphatase: 123 U/L (ref 38–126)
Anion gap: 8 (ref 5–15)
BUN: 14 mg/dL (ref 6–20)
CO2: 27 mmol/L (ref 22–32)
Calcium: 8.6 mg/dL — ABNORMAL LOW (ref 8.9–10.3)
Chloride: 102 mmol/L (ref 98–111)
Creatinine, Ser: 0.45 mg/dL (ref 0.44–1.00)
GFR, Estimated: >60 mL/min (ref >60)
Glucose, Bld: 145 mg/dL — ABNORMAL HIGH (ref 70–99)
Potassium: 3.7 mmol/L (ref 3.5–5.1)
Sodium: 137 mmol/L (ref 135–145)
Total Bilirubin: 0.4 mg/dL (ref 0.3–1.2)
Total Protein: 7.1 g/dL (ref 6.5–8.1)

## 2021-01-15 LAB — GLUCOSE, CAPILLARY
Glucose-Capillary: 124 mg/dL — ABNORMAL HIGH (ref 70–99)
Glucose-Capillary: 182 mg/dL — ABNORMAL HIGH (ref 70–99)
Glucose-Capillary: 234 mg/dL — ABNORMAL HIGH (ref 70–99)

## 2021-01-15 LAB — HEMOGLOBIN A1C
Hgb A1c MFr Bld: 5.8 % — ABNORMAL HIGH (ref 4.8–5.6)
Mean Plasma Glucose: 119.76 mg/dL

## 2021-01-15 MED ORDER — INSULIN ASPART 100 UNIT/ML IJ SOLN: 0.0000 [IU] | Freq: Three times a day (TID) | INTRAMUSCULAR | Status: DC

## 2021-01-15 MED ORDER — IBUPROFEN 200 MG PO TABS
400.0000 mg | ORAL_TABLET | Freq: Four times a day (QID) | ORAL | Status: DC | PRN
  Administered 2021-01-16: 400 mg via ORAL
  Filled 2021-01-15: qty 2

## 2021-01-15 MED ORDER — ACETAMINOPHEN 500 MG PO TABS
500.0000 mg | ORAL_TABLET | Freq: Four times a day (QID) | ORAL | Status: DC | PRN
  Administered 2021-01-15 – 2021-01-21 (×7): 500 mg via ORAL
  Filled 2021-01-15 (×7): qty 1

## 2021-01-15 NOTE — Progress Notes (Signed)
PROGRESS NOTE    Alexandra Young  HUD:[id number] DOB: Apr 03, 2001 DOA: 01/07/2021 PCP: Aretha Parrot, NP    Chief Complaint  Patient presents with  . Pelvic Pain  . Fever    Brief Narrative:  Alexandra Young is Alexandra Young 20 y.o. female with Caleel Kiner history of IBS and anxiety. She presented secondary to left flank pain. She was found to have significant fevers and non-specific findings on CT scan including Avenell Sellers prominent spleen and retroperitoneal lymph nodes.  She developed eosinophilia and Daylin Eads diffuse rash, she'd recently started lamictal within 1.5 months of her admission.  Concerning for DRESS.  Being treated with steroids.  Assessment & Plan:   Principal Problem:   DRESS syndrome Active Problems:   Intractable nausea and vomiting   Intractable pain   Pelvic pain   Fever   Splenomegaly   Elevated LFTs   Epigastric pain   Left flank pain   Rash   Endocarditis of tricuspid valve   Transaminitis   Bipolar affective disorder, current episode mixed (HCC)   Attention deficit disorder  Drug Reaction with Eosinophilia and Systemic Symptoms (DRESS)  Diffuse Maculopapular Rash Acute Liver Injury  Eosinophilia  Pruritis  Likely unifying diagnosis Started on lamictal about 1.5 months ago during psychiatric hospitalization Lamictal is high risk drug for dress Pathologist smear review -> lymphocytosis, monocytosis and eosinophilia. Absolute eosinophils normalized Rash is diffuse, spreading, has not yet improved, though mucosal involvement seems to be improving.  No blisters, bullae, sloughing.  LFT's improved -> normal platelets.  Bili 1.  Albumin 2.9.  No signs of encephalopathy. Consider punch biopsy Stop lamictal, follow.  IV steroids (bump dose with spreading rash).  LFT's improving, eosinophilia resolved.  Persistent oral mucosal involvement today of rash, slightly improved again.  Vaginal involvement has improved/resolved -> discussed with dermatology at [facility] 5/7 -> noted sounds typical for  DRESS (they noted 50% dress can have mucosal involvement) - recommended continue current plan of care and call back as needed if worsening.  Follow response.    Hyperglycemia  Steroid Induced Follow A1c, POC BG's she and mom want to hold off on SSI for now, reasonable  Myelocyte Noted on diff 5/7, follow pathology smear None noted 5/8 Continue to follow CBC with diff  Lymphadenopathy Likely 2/2 dress, follow with treatment  Chest Discomfort Mostly reproduced with palpation  She notes this has been intermittent since admission Resolved today CXR normal  EKG with sinus tach, appears similar to priors Will obtain troponin as well - negative TEE yesterday with normal EF, no RWMA (see report)  Fever of unknown origin Recurrent fever 5/4 PM Unrevealing workup so far -> DRESS seems to be unifying diagnosis HIDA scan today suggestive of hepatocellular dysfunction, hepatitis -> equivocal study in evaluation for chronic cholecystitis/biliary dyskinesia RUQ [place] with gallbladder thickenign CT chest with trace R effusion CT abdomen/pelvis without acute abnormality Prominent retroperitoneal LN along L side of aorta Pelvic US with 1.8 cm L ovarian dominant follicle, no evidence torsion Parvovirus B19 IgG positive Ehrlichia ab panel negative RMSF studies negative ANA negative RF negative RPR negative EBV ab panel suggestive of past infection CMV IgG positive, negative IgM Acute hepatitis panel negative Negative HIV RNA quant Histoplasma antigen pending  Echo with possible vegetation on tricuspid valve -> follow TEE (normal EF, no RWMA, no evidence of vegetation/infective endocarditis - see report) Repeat CT abdomen pelvis 5/4 with gallbladder wall thickening and pericholecystic fluid with development of periportal edema (acalculous cholecystitis, hepatic dysfunction, or hepatitis), spenomegaly and retroperitoneal  LAD, small volume ascites  Abdominal Pain Follow repeat CT scan today as  above Follow repeat lipase, mildly elevated Pain management Resolved  Bacterial Vaginosis Flagyl   Thrush Nystatin  Prominent Spleen Retroperitoneal Lymphadenopathy Heme onc was considering bone marrow bx, with alternative diagnoses above, I think reasonable to continue to hold off on this  SPEP without monoclonal protein IgM, IgA, IgG wnl Beta 2 microglobulin wnl  Anxiety  Depression Holding lamictal with above Buspar As holding lamictal, will c/s psych to discuss alternative meds -> abilify  IBS   Allergies Singulair, claritin  Hypokalemia Replace and follow  DVT prophylaxis: lovenox Code Status: full  Family Communication: none at bedside - mother phone 5/7 and 5/8 Disposition:   Status is: Inpatient  Remains inpatient appropriate because:Inpatient level of care appropriate due to severity of illness   Dispo: The patient is from: Home              Anticipated d/c is to: Home              Patient currently is not medically stable to d/c.   Difficult to place patient No   Consultants:   ID  Cardiology  Procedures Echo IMPRESSIONS    1. Left ventricular ejection fraction, by estimation, is 60 to 65%. The  left ventricle has normal function. The left ventricle has no regional  wall motion abnormalities. Left ventricular diastolic parameters were  normal.  2. Right ventricular systolic function is normal. The right ventricular  size is normal.  3. The mitral valve is normal in structure. No evidence of mitral valve  regurgitation. No evidence of mitral stenosis.  4. Possible vegetation on tricuspid valve, 1.2 x 0.4cm mobile, septal  leaflet. Consider TEE for further clarification. The tricuspid valve is  abnormal.  5. The aortic valve is normal in structure. Aortic valve regurgitation is  not visualized. No aortic stenosis is present.  6. The inferior vena cava is normal in size with greater than 50%  respiratory variability, suggesting  right atrial pressure of 3 mmHg.  Antimicrobials:  Anti-infectives (From admission, onward)   Start     Dose/Rate Route Frequency Ordered Stop   01/13/21 1000  fluconazole (DIFLUCAN) tablet 150 mg  Status:  Discontinued        150 mg Oral Daily 01/13/21 0709 01/14/21 1320   01/13/21 1000  metroNIDAZOLE (FLAGYL) tablet 500 mg        500 mg Oral 2 times daily 01/13/21 0833 01/20/21 0959   01/10/21 1645  fluconazole (DIFLUCAN) tablet 150 mg        150 mg Oral  Once 01/10/21 1554 01/10/21 1641   01/07/21 0715  cefTRIAXone (ROCEPHIN) 1 g in sodium chloride 0.9 % 100 mL IVPB        1 g 200 mL/hr over 30 Minutes Intravenous  Once 01/07/21 0710 01/07/21 0751     Subjective: Continued itching Asking when she can go home Rash in pubic region maybe better? No dysuria.  Rash spreading between fingers and toes.  Objective: Vitals:   01/14/21 0511 01/14/21 1345 01/14/21 2023 01/15/21 0513  BP: 117/76 121/76 119/82 124/78  Pulse: 68 85 81 80  Resp: 17  18 18   Temp: 98.2 F (36.8 C) 99.1 F (37.3 C) 98.8 F (37.1 C) 98.3 F (36.8 C)  TempSrc: Oral Oral Oral Oral  SpO2: 99% 100% 99% 99%  Weight:      Height:        Intake/Output Summary (Last 24  hours) at 01/15/2021 1016 Last data filed at 01/15/2021 0700 Gross per 24 hour  Intake 480 ml  Output --  Net 480 ml   Filed Weights   01/07/21 0600  Weight: 61.2 kg    Examination:  General: No acute distress. Cardiovascular: RRR Abdomen: Soft, nontender, nondistended Neurological: Alert and oriented 3. Moves all extremities 4. Cranial nerves II through XII grossly intact. Skin: diffuse confluent erythematous plaques noted to trunk and upper extremities, erythematous papules spreading to fingers/toes, macules to palms and soles, oropharyngeal involvement appears to be improving.  Deferred vaginal exam today with improvement yesterday and her reported continued improvement today.  No blisters, bullae, sloughing of skin.  Facial fullness.   Extremities: No clubbing or cyanosis. No edema    Data Reviewed: I have personally reviewed following labs and imaging studies  CBC: Recent Labs  Lab 01/10/21 0257 01/11/21 0424 01/12/21 0452 01/13/21 0418 01/14/21 0539 01/15/21 0746  WBC 10.3 12.6* 13.9* 16.9* 19.2* 23.7*  NEUTROABS 2.7  --  4.2 5.0  --  11.5*  HGB 12.2 11.7* 12.5 11.2* 10.9* 11.6*  HCT 37.0 34.5* 36.7 33.4* 32.6* 34.5*  MCV 84.1 84.1 84.0 83.9 83.6 84.6  PLT 173 154 203 255 317 416*    Basic Metabolic Panel: Recent Labs  Lab 01/11/21 0424 01/12/21 0452 01/13/21 0418 01/14/21 0539 01/15/21 0746  NA 139 137 138 143 137  K 4.0 3.2* 3.8 3.9 3.7  CL 108 105 104 109 102  CO2 20* 23 23 24 27   GLUCOSE 119* 105* 116* 147* 145*  BUN <5* <5* 10 12 14   CREATININE 0.42* 0.51 0.56 0.39* 0.45  CALCIUM 8.0* 8.1* 8.3* 8.6* 8.6*  MG  --  2.2 2.2 2.3  --   PHOS  --  2.9 2.6 4.6  --     GFR: Estimated Creatinine Clearance: 95.7 mL/min (by C-G formula based on SCr of 0.45 mg/dL).  Liver Function Tests: Recent Labs  Lab 01/11/21 0424 01/12/21 0452 01/13/21 0418 01/14/21 0539 01/15/21 0746  AST 124* 146* 114* 60* 41  ALT 142* 227* 190* 176* 168*  ALKPHOS 108 148* 156* 138* 123  BILITOT 1.2 1.4* 1.0 0.3 0.4  PROT 6.0* 6.2* 6.0* 6.7 7.1  ALBUMIN 3.1* 3.0* 2.9* 3.3* 3.6    CBG: No results for input(s): GLUCAP in the last 168 hours.   Recent Results (from the past 240 hour(s))  Urine culture     Status: Abnormal   Collection Time: 01/07/21  9:32 AM   Specimen: Urine, Catheterized  Result Value Ref Range Status   Specimen Description   Final    URINE, CATHETERIZED Performed at [facility], [address], [place], [place]    Special Requests   Final    NONE Performed at [facility], [address] [place], [place]    Culture (Venicia Vandall)  Final    <10,000 COLONIES/mL INSIGNIFICANT GROWTH Performed at [facility], [address]  [place], [address] [place]    Report Status 01/08/2021 FINAL  Final  Culture, blood (routine x 2)     Status: None   Collection Time: 01/07/21  1:39 PM   Specimen: BLOOD RIGHT HAND  Result Value Ref Range Status   Specimen Description   Final    BLOOD RIGHT HAND Performed at [facility], [address], [place], [place]    Special Requests   Final    BOTTLES DRAWN AEROBIC ONLY Blood  Culture adequate volume Performed at [facility], [address] [place], [place] [address]    Culture   Final    NO GROWTH 5 DAYS Performed at [facility], [address] [place], [place] [address]    Report Status 01/12/2021 FINAL  Final  Culture, blood (routine x 2)     Status: None   Collection Time: 01/07/21  1:39 PM   Specimen: BLOOD LEFT HAND  Result Value Ref Range Status   Specimen Description   Final    BLOOD LEFT HAND Performed at [facility], [address] [place], [place] [address]    Special Requests   Final    BOTTLES DRAWN AEROBIC ONLY Blood Culture results may not be optimal due to an inadequate volume of blood received in culture bottles Performed at [facility], [address] [place], [place] [address]    Culture   Final    NO GROWTH 5 DAYS Performed at [facility], [address], [place], [place] [address]    Report Status 01/12/2021 FINAL  Final  Blastomyces Antigen     Status: None   Collection Time: 01/07/21  6:45 PM  Result Value Ref Range Status   Blastomyces Antigen None Detected None Detected ng/mL Final    Comment: (NOTE) Results reported as ng/mL in 0.2 - 14.7 ng/mL range Results above the limit of detection but below 0.2 ng/mL are reported as 'Positive, Below the Limit of Quantification' Results above 14.7 ng/mL are reported as 'Positive, Above the Limit of Quantification'    Specimen Type SERUM  Final    Comment: (NOTE) Performed At: [facility] [address] [place], [place] [id number] Phylis Bougie MD XT:[id number]          Radiology Studies: DG CHEST PORT 1 VIEW  Result Date: 01/13/2021 CLINICAL DATA:  20 y/o female inpatient with chest pain that started on April 30th per patient Pt denies being Soyla Bainter smoker Pt denies SOB Pt states pain in midline upper chest Hx of Asthma EXAM: PORTABLE CHEST 1 VIEW COMPARISON:  None. FINDINGS: Normal mediastinum and cardiac silhouette. Normal pulmonary vasculature. No evidence of effusion, infiltrate, or pneumothorax. No acute bony abnormality. IMPRESSION: Normal chest radiograph Electronically Signed   By: Genevive Bi M.D.   On: 01/13/2021 14:33        Scheduled Meds: . ARIPiprazole  2 mg Oral Once  . ARIPiprazole  5 mg Oral Daily  . busPIRone  10 mg Oral BID  . enoxaparin (LOVENOX) injection  40 mg Subcutaneous Q24H  . loratadine  10 mg Oral Daily  . methylPREDNISolone (SOLU-MEDROL) injection  60 mg Intravenous Q6H  . metroNIDAZOLE  500 mg Oral BID  . montelukast  10 mg Oral QHS  . nystatin  5 mL Oral QID  . pantoprazole  40 mg Oral Daily  . sodium chloride flush  10-40 mL Intracatheter Q12H  . triamcinolone ointment   Topical TID   Continuous Infusions:    LOS: 6 days    Time spent: over 30 min    Lacretia Nicks, MD [facility]   To contact the attending provider between 7A-7P or the covering provider during after hours 7P-7A, please log into the web site [email or web address] and access using universal [place] password for that web site. If you do not have the password, please call the hospital operator.  01/15/2021, 10:16 AM

## 2021-01-16 ENCOUNTER — Inpatient Hospital Stay (HOSPITAL_COMMUNITY): Payer: Medicaid Other

## 2021-01-16 DIAGNOSIS — D7212 Drug rash with eosinophilia and systemic symptoms syndrome: Secondary | ICD-10-CM | POA: Diagnosis not present

## 2021-01-16 DIAGNOSIS — T50905A Adverse effect of unspecified drugs, medicaments and biological substances, initial encounter: Secondary | ICD-10-CM | POA: Diagnosis not present

## 2021-01-16 LAB — CBC WITH DIFFERENTIAL/PLATELET
Abs Immature Granulocytes: NONE SEEN 10*3/uL (ref 0.00–0.07)
Band Neutrophils: 4 %
Basophils Relative: 0 %
Blasts: NONE SEEN %
Eosinophils Relative: 4 %
HCT: 35.6 % — ABNORMAL LOW (ref 36.0–46.0)
Hemoglobin: 11.8 g/dL — ABNORMAL LOW (ref 12.0–15.0)
Immature Granulocytes: NONE SEEN %
Lymphocytes Relative: 28 %
MCH: 28 pg (ref 26.0–34.0)
MCHC: 33.1 g/dL (ref 30.0–36.0)
MCV: 84.4 fL (ref 80.0–100.0)
Metamyelocytes Relative: NONE SEEN %
Monocytes Relative: 3 %
Myelocytes: NONE SEEN %
Neutrophils Relative %: 60 %
Platelets: 406 10*3/uL — ABNORMAL HIGH (ref 150–400)
Promyelocytes Relative: NONE SEEN %
RBC Morphology: NORMAL
RBC: 4.22 MIL/uL (ref 3.87–5.11)
RDW: 14.6 % (ref 11.5–15.5)
WBC Morphology: NORMAL
WBC: 23.2 10*3/uL — ABNORMAL HIGH (ref 4.0–10.5)
nRBC: 0 % (ref 0.0–0.2)
nRBC: NONE SEEN /100 WBC

## 2021-01-16 LAB — COMPREHENSIVE METABOLIC PANEL
ALT: 128 U/L — ABNORMAL HIGH (ref 0–44)
AST: 25 U/L (ref 15–41)
Albumin: 3.5 g/dL (ref 3.5–5.0)
Alkaline Phosphatase: 98 U/L (ref 38–126)
Anion gap: 7 (ref 5–15)
BUN: 15 mg/dL (ref 6–20)
CO2: 26 mmol/L (ref 22–32)
Calcium: 8.7 mg/dL — ABNORMAL LOW (ref 8.9–10.3)
Chloride: 104 mmol/L (ref 98–111)
Creatinine, Ser: 0.49 mg/dL (ref 0.44–1.00)
GFR, Estimated: >60 mL/min (ref >60)
Glucose, Bld: 163 mg/dL — ABNORMAL HIGH (ref 70–99)
Potassium: 4.6 mmol/L (ref 3.5–5.1)
Sodium: 137 mmol/L (ref 135–145)
Total Bilirubin: 0.2 mg/dL — ABNORMAL LOW (ref 0.3–1.2)
Total Protein: 6.4 g/dL — ABNORMAL LOW (ref 6.5–8.1)

## 2021-01-16 LAB — GLUCOSE, CAPILLARY
Glucose-Capillary: 134 mg/dL — ABNORMAL HIGH (ref 70–99)
Glucose-Capillary: 134 mg/dL — ABNORMAL HIGH (ref 70–99)
Glucose-Capillary: 210 mg/dL — ABNORMAL HIGH (ref 70–99)
Glucose-Capillary: 204 mg/dL — ABNORMAL HIGH (ref 70–99)

## 2021-01-16 LAB — BRAIN NATRIURETIC PEPTIDE: B Natriuretic Peptide: 57.2 pg/mL (ref 0.0–100.0)

## 2021-01-16 LAB — MAGNESIUM: Magnesium: 2.5 mg/dL — ABNORMAL HIGH (ref 1.7–2.4)

## 2021-01-16 LAB — PHOSPHORUS: Phosphorus: 4.3 mg/dL (ref 2.5–4.6)

## 2021-01-16 MED ORDER — IOHEXOL 350 MG/ML SOLN
100.0000 mL | Freq: Once | INTRAVENOUS | Status: AC | PRN
  Administered 2021-01-16: 100 mL via INTRAVENOUS

## 2021-01-16 NOTE — Significant Event (Signed)
Rapid Response Event Note   Reason for Call :  Patient c/o shortness of breath and dizziness. Unit charge RN requests additional assessment and support.  Initial Focused Assessment:  Patient in hospital since 01/07/21, diagnosed with DRESS syndrome related to Lamictal medication she was on for two months for her depression. Patient assessment reveals: diffuse red rash, lungs clear to auscultation, heart sounds S1-S2 without rubs, gallops, or murmurs, peripheral pulses 2-3+ with capillary refill less than 3 seconds in all four extremities, and no evidence of edema. VS within expected range. Capillary blood glucose 202. Patient reports dizziness when she turns her head, shortness of breath where it feels difficult to inhale, and generalized discomfort without quantifiable number. Prior to RR call, CXR & EKG performed for same symptoms were unremarkable for acute changes.  CTA scan already ordered by on call MD. Midline IV assessed; flushes well, but does not give blood return. Patient likewise c/o soreness at this site. IV team RN at bedside and will further assess site. She will place new site for CT.  Patient's mother on video conference during this visit and inquired about labs including D. Dimer. This lab has not been done. Interventions:  Collaborated with unit nurses, IV team RN, and on call physician. Dr. Frederick Peers advised that d.dimer would be falsely elevated and no other labs or tests indicated beyond CTA. Advised that could attempt anti-anxiety meds if scan negative.  Plan of Care:   Obtain CTA Follow up with results. Possible administration of anti-anxiety meds. Provided reassurance to patient and mother on video chat.  Event Summary:   MD Notified: 2130 Call Time: 2043 Arrival Time: 2050 End Time: 2200  Addendum: received update from Dr. Frederick Peers that CTA was negative and further research on DRESS syndrome revealed that patient's symptoms consistent with 30% of patients. Patient will  stay in current bed assignment and bedside RN will continue to provide care plan.  Lamona Curl, RN

## 2021-01-16 NOTE — Progress Notes (Addendum)
PROGRESS NOTE    Alexandra Young  ZOX:[id number] DOB: 2001/08/02 DOA: 01/07/2021 PCP: Aretha Parrot, NP    Chief Complaint  Patient presents with  . Pelvic Pain  . Fever    Brief Narrative:  Alexandra Young is Alexandra Young 20 y.o. female with Danikah Budzik history of IBS and anxiety. She presented secondary to left flank pain. She was found to have significant fevers and non-specific findings on CT scan including Naylea Wigington prominent spleen and retroperitoneal lymph nodes.  She developed eosinophilia and Nuria Phebus diffuse rash, she'd recently started lamictal within 1.5 months of her admission.  Concerning for DRESS.  Being treated with steroids.  Assessment & Plan:   Principal Problem:   DRESS syndrome Active Problems:   Intractable nausea and vomiting   Intractable pain   Pelvic pain   Fever   Splenomegaly   Elevated LFTs   Epigastric pain   Left flank pain   Rash   Endocarditis of tricuspid valve   Transaminitis   Bipolar affective disorder, current episode mixed (HCC)   Attention deficit disorder  Addendum: Called with concern for SOB - discussed with patient, she's noticed SOB since this morning after showering -> SOB with exertion, ? Orthopnea.  Will get CXR, EKG.  Likely CT PE protocol.  Mom also expressed to me frustration that she was on abilify still, she's concerned about adverse effects.  Will ask psychiatry to talk to mom about Abilify.    Drug Reaction with Eosinophilia and Systemic Symptoms (DRESS)  Diffuse Maculopapular Rash Acute Liver Injury  Eosinophilia  Pruritis  Likely unifying diagnosis Started on lamictal about 1.5 months ago during psychiatric hospitalization Lamictal is high risk drug for dress Pathologist smear review -> lymphocytosis, monocytosis and eosinophilia. Absolute eosinophils normalized Rash is diffuse, spreading, has not yet improved, though mucosal involvement is continuing to improve.  She does not have any blisters, bullae, sloughing lesions.  LFT's improving -> normal  platelets.  Bili 0.2.  Albumin 3.5.  No signs of encephalopathy. Consider punch biopsy Needs thyroid function tests within about 6 weeks Will need Jeanpaul Biehl prolonged steroid taper  Stop lamictal, follow.  IV steroids (bump dose with spreading rash).  LFT's improving, eosinophilia resolved.  Oral mucosal involvement improved.  Vaginal involvement has improved/resolved -> discussed with dermatology at [facility] 5/7 -> noted sounds typical for DRESS (they noted 50% dress can have mucosal involvement) - recommended continue current plan of care and call back as needed if worsening.  Follow response.    Prediabetes  Hyperglycemia  Steroid Induced Follow A1c (5.8), POC BG's she and mom want to hold off on SSI for now, reasonable  Myelocyte Noted on diff 5/7, follow pathology smear (pending) None noted 5/8 Continue to follow CBC with diff  Lymphadenopathy Likely 2/2 dress, follow with treatment Posterior cervical, posterior auricular, occipital LN's as well as retroperitoneal LAD  Chest Discomfort Mostly reproduced with palpation  She notes this has been intermittent since admission Resolved  CXR normal  EKG with sinus tach, appears similar to priors Will obtain troponin as well - negative TEE yesterday with normal EF, no RWMA (see report)  Fever of unknown origin Recurrent fever 5/4 PM Unrevealing workup so far -> DRESS seems to be unifying diagnosis HIDA scan today suggestive of hepatocellular dysfunction, hepatitis -> equivocal study in evaluation for chronic cholecystitis/biliary dyskinesia RUQ [place] with gallbladder thickenign CT chest with trace R effusion CT abdomen/pelvis without acute abnormality Prominent retroperitoneal LN along L side of aorta Pelvic US with 1.8 cm L ovarian  dominant follicle, no evidence torsion Parvovirus B19 IgG positive Ehrlichia ab panel negative RMSF studies negative ANA negative RF negative RPR negative EBV ab panel suggestive of past infection CMV IgG  positive, negative IgM Acute hepatitis panel negative Negative HIV RNA quant Histoplasma antigen pending  Echo with possible vegetation on tricuspid valve -> follow TEE (normal EF, no RWMA, no evidence of vegetation/infective endocarditis - see report) Repeat CT abdomen pelvis 5/4 with gallbladder wall thickening and pericholecystic fluid with development of periportal edema (acalculous cholecystitis, hepatic dysfunction, or hepatitis), spenomegaly and retroperitoneal LAD, small volume ascites  Abdominal Pain Follow repeat CT scan today as above -> with gallbladder wall thickening and pericholecystic fluid (likely 2/2 hepatic dysfunction) will need follow up outpatient  Follow repeat lipase, mildly elevated Pain management Resolved  Bacterial Vaginosis Flagyl   Thrush Nystatin  Prominent Spleen Retroperitoneal Lymphadenopathy Heme onc was considering bone marrow bx, with alternative diagnoses above, I think reasonable to continue to hold off on this  SPEP without monoclonal protein IgM, IgA, IgG wnl Beta 2 microglobulin wnl  Anxiety  Depression Holding lamictal with above Buspar As holding lamictal, will c/s psych to discuss alternative meds -> abilify  IBS   Allergies Singulair, claritin  Hypokalemia Replace and follow  DVT prophylaxis: lovenox Code Status: full  Family Communication: none at bedside - mother phone 5/7 and 5/8 Disposition:   Status is: Inpatient  Remains inpatient appropriate because:Inpatient level of care appropriate due to severity of illness   Dispo: The patient is from: Home              Anticipated d/c is to: Home              Patient currently is not medically stable to d/c.   Difficult to place patient No   Consultants:   ID  Cardiology  Procedures Echo IMPRESSIONS    1. Left ventricular ejection fraction, by estimation, is 60 to 65%. The  left ventricle has normal function. The left ventricle has no regional  wall motion  abnormalities. Left ventricular diastolic parameters were  normal.  2. Right ventricular systolic function is normal. The right ventricular  size is normal.  3. The mitral valve is normal in structure. No evidence of mitral valve  regurgitation. No evidence of mitral stenosis.  4. Possible vegetation on tricuspid valve, 1.2 x 0.4cm mobile, septal  leaflet. Consider TEE for further clarification. The tricuspid valve is  abnormal.  5. The aortic valve is normal in structure. Aortic valve regurgitation is  not visualized. No aortic stenosis is present.  6. The inferior vena cava is normal in size with greater than 50%  respiratory variability, suggesting right atrial pressure of 3 mmHg.  Antimicrobials:  Anti-infectives (From admission, onward)   Start     Dose/Rate Route Frequency Ordered Stop   01/13/21 1000  fluconazole (DIFLUCAN) tablet 150 mg  Status:  Discontinued        150 mg Oral Daily 01/13/21 0709 01/14/21 1320   01/13/21 1000  metroNIDAZOLE (FLAGYL) tablet 500 mg        500 mg Oral 2 times daily 01/13/21 0833 01/20/21 0959   01/10/21 1645  fluconazole (DIFLUCAN) tablet 150 mg        150 mg Oral  Once 01/10/21 1554 01/10/21 1641   01/07/21 0715  cefTRIAXone (ROCEPHIN) 1 g in sodium chloride 0.9 % 100 mL IVPB        1 g 200 mL/hr over 30 Minutes Intravenous  Once  01/07/21 0710 01/07/21 0751     Subjective: Continues to feel better, itching persistent Rash to toes feels better, less painful  Still with diffuse rash  Objective: Vitals:   01/15/21 0513 01/15/21 1347 01/15/21 1956 01/16/21 0431  BP: 124/78 114/75 114/71 117/79  Pulse: 80 87 81 80  Resp: 18 17 18 17   Temp: 98.3 F (36.8 C) 99.2 F (37.3 C) 98.7 F (37.1 C) 98.8 F (37.1 C)  TempSrc: Oral Oral Oral Oral  SpO2: 99% 100% 99% 97%  Weight:      Height:        Intake/Output Summary (Last 24 hours) at 01/16/2021 1138 Last data filed at 01/16/2021 0907 Gross per 24 hour  Intake 724 ml  Output --  Net  724 ml   Filed Weights   01/07/21 0600  Weight: 61.2 kg    Examination:  General: No acute distress. Cardiovascular: Heart sounds show Mayumi Summerson regular rate, and rhythm.  Lungs: Clear to auscultation bilaterally  Abdomen: Soft, nontender, nondistended  Neurological: Alert and oriented 3. Moves all extremities 4 . Cranial nerves II through XII grossly intact. Skin: diffuse erythematous plaques and morbilliform rash - blanching - papules noted on palms and soles - rash on distal extremities is more papular - oral mucosal involvement seems to have improved/resolved.  Vaginal exam not performed today given recent improvement and her report with no worsening, improved labial swelling, no dysuria.  Facial fullness. Extremities: No clubbing or cyanosis. No edema.     Data Reviewed: I have personally reviewed following labs and imaging studies  CBC: Recent Labs  Lab 01/10/21 0257 01/11/21 0424 01/12/21 0452 01/13/21 0418 01/14/21 0539 01/15/21 0746 01/16/21 0514  WBC 10.3   < > 13.9* 16.9* 19.2* 23.7* 23.2*  NEUTROABS 2.7  --  4.2 5.0  --  11.5*  --   HGB 12.2   < > 12.5 11.2* 10.9* 11.6* 11.8*  HCT 37.0   < > 36.7 33.4* 32.6* 34.5* 35.6*  MCV 84.1   < > 84.0 83.9 83.6 84.6 84.4  PLT 173   < > 203 255 317 416* 406*   < > = values in this interval not displayed.    Basic Metabolic Panel: Recent Labs  Lab 01/12/21 0452 01/13/21 0418 01/14/21 0539 01/15/21 0746 01/16/21 0514  NA 137 138 143 137 137  K 3.2* 3.8 3.9 3.7 4.6  CL 105 104 109 102 104  CO2 23 23 24 27 26   GLUCOSE 105* 116* 147* 145* 163*  BUN <5* 10 12 14 15   CREATININE 0.51 0.56 0.39* 0.45 0.49  CALCIUM 8.1* 8.3* 8.6* 8.6* 8.7*  MG 2.2 2.2 2.3  --  2.5*  PHOS 2.9 2.6 4.6  --  4.3    GFR: Estimated Creatinine Clearance: 95.7 mL/min (by C-G formula based on SCr of 0.49 mg/dL).  Liver Function Tests: Recent Labs  Lab 01/12/21 0452 01/13/21 0418 01/14/21 0539 01/15/21 0746 01/16/21 0514  AST 146* 114*  60* 41 25  ALT 227* 190* 176* 168* 128*  ALKPHOS 148* 156* 138* 123 98  BILITOT 1.4* 1.0 0.3 0.4 0.2*  PROT 6.2* 6.0* 6.7 7.1 6.4*  ALBUMIN 3.0* 2.9* 3.3* 3.6 3.5    CBG: Recent Labs  Lab 01/15/21 1223 01/15/21 1645 01/15/21 2110 01/16/21 0809  GLUCAP 124* 182* 234* 134*     Recent Results (from the past 240 hour(s))  Urine culture     Status: Abnormal   Collection Time: 01/07/21  9:32 AM  Specimen: Urine, Catheterized  Result Value Ref Range Status   Specimen Description   Final    URINE, CATHETERIZED Performed at [facility], [address] [place], [place] [address]    Special Requests   Final    NONE Performed at [facility], [address] [place], [place] [address]    Culture (Grace Haggart)  Final    <10,000 COLONIES/mL INSIGNIFICANT GROWTH Performed at [facility], [address], [place], [place] [address]    Report Status 01/08/2021 FINAL  Final  Culture, blood (routine x 2)     Status: None   Collection Time: 01/07/21  1:39 PM   Specimen: BLOOD RIGHT HAND  Result Value Ref Range Status   Specimen Description   Final    BLOOD RIGHT HAND Performed at [facility], [address] [place], [place] [address]    Special Requests   Final    BOTTLES DRAWN AEROBIC ONLY Blood Culture adequate volume Performed at [facility], [address] [place], [place] [address]    Culture   Final    NO GROWTH 5 DAYS Performed at [facility], [address], [place], [place] [address]    Report Status 01/12/2021 FINAL  Final  Culture, blood (routine x 2)     Status: None   Collection Time: 01/07/21  1:39 PM   Specimen: BLOOD LEFT HAND  Result Value Ref Range Status   Specimen Description   Final    BLOOD LEFT HAND Performed at [facility], [address], [place], [place] [address]    Special Requests   Final    BOTTLES DRAWN AEROBIC ONLY Blood Culture results  may not be optimal due to an inadequate volume of blood received in culture bottles Performed at [facility], [address] [place], [place] [address]    Culture   Final    NO GROWTH 5 DAYS Performed at [facility], [address], [place], [place] [address]    Report Status 01/12/2021 FINAL  Final  Blastomyces Antigen     Status: None   Collection Time: 01/07/21  6:45 PM  Result Value Ref Range Status   Blastomyces Antigen None Detected None Detected ng/mL Final    Comment: (NOTE) Results reported as ng/mL in 0.2 - 14.7 ng/mL range Results above the limit of detection but below 0.2 ng/mL are reported as 'Positive, Below the Limit of Quantification' Results above 14.7 ng/mL are reported as 'Positive, Above the Limit of Quantification'    Specimen Type SERUM  Final    Comment: (NOTE) Performed At: [facility] [address] [place], [place] [id number] Phylis Bougie MD OX:[id number]          Radiology Studies: No results found.      Scheduled Meds: . ARIPiprazole  2 mg Oral Once  . ARIPiprazole  5 mg Oral Daily  . busPIRone  10 mg Oral BID  . enoxaparin (LOVENOX) injection  40 mg Subcutaneous Q24H  . loratadine  10 mg Oral Daily  . methylPREDNISolone (SOLU-MEDROL) injection  60 mg Intravenous Q6H  . metroNIDAZOLE  500 mg Oral BID  . montelukast  10 mg Oral QHS  . nystatin  5 mL Oral QID  . pantoprazole  40 mg Oral Daily  . sodium chloride flush  10-40 mL Intracatheter Q12H  . triamcinolone ointment   Topical TID   Continuous Infusions:  LOS: 7 days    Time spent: over 30 min    Lacretia Nicks, MD [facility]   To contact the attending provider between 7A-7P or the covering provider during after hours 7P-7A, please log into the web site [email or web address] and access using universal [place] password for that web site. If you do not have the password, please call the hospital operator.  01/16/2021,  11:38 AM

## 2021-01-17 DIAGNOSIS — D7212 Drug rash with eosinophilia and systemic symptoms syndrome: Secondary | ICD-10-CM | POA: Diagnosis not present

## 2021-01-17 DIAGNOSIS — T50905A Adverse effect of unspecified drugs, medicaments and biological substances, initial encounter: Secondary | ICD-10-CM | POA: Diagnosis not present

## 2021-01-17 LAB — MAGNESIUM: Magnesium: 2.4 mg/dL (ref 1.7–2.4)

## 2021-01-17 LAB — COMPREHENSIVE METABOLIC PANEL
ALT: 101 U/L — ABNORMAL HIGH (ref 0–44)
AST: 19 U/L (ref 15–41)
Albumin: 3.3 g/dL — ABNORMAL LOW (ref 3.5–5.0)
Alkaline Phosphatase: 85 U/L (ref 38–126)
Anion gap: 8 (ref 5–15)
BUN: 15 mg/dL (ref 6–20)
CO2: 26 mmol/L (ref 22–32)
Calcium: 8.6 mg/dL — ABNORMAL LOW (ref 8.9–10.3)
Chloride: 102 mmol/L (ref 98–111)
Creatinine, Ser: <0.3 mg/dL — ABNORMAL LOW (ref 0.44–1.00)
Glucose, Bld: 156 mg/dL — ABNORMAL HIGH (ref 70–99)
Potassium: 4 mmol/L (ref 3.5–5.1)
Sodium: 136 mmol/L (ref 135–145)
Total Bilirubin: 0.3 mg/dL (ref 0.3–1.2)
Total Protein: 6.3 g/dL — ABNORMAL LOW (ref 6.5–8.1)

## 2021-01-17 LAB — CBC WITH DIFFERENTIAL/PLATELET
Abs Immature Granulocytes: 0.63 10*3/uL — ABNORMAL HIGH (ref 0.00–0.07)
Basophils Absolute: 0.1 10*3/uL (ref 0.0–0.1)
Basophils Relative: 1 %
Eosinophils Absolute: 0.5 10*3/uL (ref 0.0–0.5)
Eosinophils Relative: 3 %
HCT: 35.7 % — ABNORMAL LOW (ref 36.0–46.0)
Hemoglobin: 12 g/dL (ref 12.0–15.0)
Immature Granulocytes: 3 %
Lymphocytes Relative: 24 %
Lymphs Abs: 4.5 10*3/uL — ABNORMAL HIGH (ref 0.7–4.0)
MCH: 28.6 pg (ref 26.0–34.0)
MCHC: 33.6 g/dL (ref 30.0–36.0)
MCV: 85 fL (ref 80.0–100.0)
Monocytes Absolute: 1 10*3/uL (ref 0.1–1.0)
Monocytes Relative: 5 %
Neutro Abs: 11.7 10*3/uL — ABNORMAL HIGH (ref 1.7–7.7)
Neutrophils Relative %: 64 %
Platelets: 448 10*3/uL — ABNORMAL HIGH (ref 150–400)
RBC: 4.2 MIL/uL (ref 3.87–5.11)
RDW: 14.5 % (ref 11.5–15.5)
WBC: 18.3 10*3/uL — ABNORMAL HIGH (ref 4.0–10.5)
nRBC: 0 % (ref 0.0–0.2)

## 2021-01-17 LAB — GLUCOSE, CAPILLARY
Glucose-Capillary: 134 mg/dL — ABNORMAL HIGH (ref 70–99)
Glucose-Capillary: 237 mg/dL — ABNORMAL HIGH (ref 70–99)
Glucose-Capillary: 194 mg/dL — ABNORMAL HIGH (ref 70–99)
Glucose-Capillary: 163 mg/dL — ABNORMAL HIGH (ref 70–99)

## 2021-01-17 LAB — PHOSPHORUS: Phosphorus: 4.7 mg/dL — ABNORMAL HIGH (ref 2.5–4.6)

## 2021-01-17 LAB — PATHOLOGIST SMEAR REVIEW

## 2021-01-17 MED ORDER — METHYLPREDNISOLONE SODIUM SUCC 125 MG IJ SOLR: 60.0000 mg | Freq: Two times a day (BID) | INTRAMUSCULAR | Status: DC

## 2021-01-17 MED ORDER — METHYLPREDNISOLONE SODIUM SUCC 125 MG IJ SOLR
60.0000 mg | Freq: Four times a day (QID) | INTRAMUSCULAR | Status: DC
  Administered 2021-01-17 – 2021-01-18 (×2): 60 mg via INTRAVENOUS
  Filled 2021-01-17 (×2): qty 2

## 2021-01-17 MED ORDER — LORAZEPAM 0.5 MG PO TABS
0.5000 mg | ORAL_TABLET | Freq: Two times a day (BID) | ORAL | Status: DC | PRN
  Administered 2021-01-17 – 2021-01-20 (×5): 0.5 mg via ORAL
  Filled 2021-01-17 (×5): qty 1

## 2021-01-17 NOTE — Consult Note (Signed)
Psych consult placed for mom wants to talk to psychiatry more extensively prior to starting abilify. Phone call was initiated by provider to [place] at the phone number listed. Mother expressed concerns of blurred vision as a side effect. Writer discussed with mom that careful review of [facility] and Up to Date, do not have blurred vision listed as a side effect. Writer again reviewed side effect that was previously discussed with mom and patient. Mother placed emphasis on "rare side effects" which I reviewed with her as Neuroleptic Malignant syndrome and Hyperglycemia. She states she is more concerned about the rare side effects, as DRESS is a rare side effect of Lamictal. Per mom " the last medicine she was on gave her blurred vision also. I discussed with her PCP that it is best we keep her on the Buspar to treat her depression and anxiety as it seems to be working. Writer encouraged mother to utilize appropriate pharmaceutical literature and try to limit goggle searchs. She is wishing to decline any additional medications at this time. All questions and concerns have been addressed, prior to terminating the call. Mother is anticipating getting her psychiatry appointment and schedule genetic testing to determine which pharmacological agents will suite her daughter best.   Psychiatry to sign off at this time.  Recommend referral to outpatient [facility], patient with [facility] Medicaid and will need assistance with providers in network. Previously encouraged patient to reach out to insurance to determine who is in network.  -Please provide information for mobile crisis and [facility] for urgent care and crisis management once discharged she will have additional resources in the community.

## 2021-01-17 NOTE — Progress Notes (Addendum)
HEMATOLOGY-ONCOLOGY PROGRESS NOTE  SUBJECTIVE: Fevers have resolved.  She denies abdominal pain this morning.  She notes pain to the right side of her chest close to her shoulder, but has no other symptoms associated with this.  She is not having any nausea or vomiting.  She reports constipation which she states is consistent with her baseline.  She also notes that she took Imodium recently for diarrhea which is probably contributing to her constipation.  Reports ongoing rash which is still somewhat pruritic.  The patient tells me that she is hoping to be discharged today.  REVIEW OF SYSTEMS:   Constitutional: Denies fevers and chills Eyes: Denies blurriness of vision Ears, nose, mouth, throat, and face: Denies mucositis or sore throat Respiratory: Denies cough, dyspnea or wheezes Cardiovascular: Reports right-sided upper chest pain near right shoulder Gastrointestinal: Denies abdominal pain, nausea, vomiting.  Reports constipation. Skin: Rash noted to chest, arms, legs, feet Neurological:Denies numbness, tingling or new weaknesses Behavioral/Psych: Mood is stable, no new changes  Extremities: No lower extremity edema All other systems were reviewed with the patient and are negative.  I have reviewed the past medical history, past surgical history, social history and family history with the patient and they are unchanged from previous note.   PHYSICAL EXAMINATION:  Vitals:   01/16/21 2033 01/17/21 0544  BP: 124/84 117/62  Pulse: 79 72  Resp: 18 18  Temp: (!) 97.5 F (36.4 C) 98.1 F (36.7 C)  SpO2:  99%   Filed Weights   01/07/21 0600  Weight: 61.2 kg    Intake/Output from previous day: 05/09 0701 - 05/10 0700 In: 354 [P.O.:354] Out: -   GENERAL:alert, no distress and comfortable SKIN: Diffuse morbilliform erythematous rash most notable on the back, abdomen, arms EYES: normal, Conjunctiva are pink and non-injected, sclera clear LUNGS: clear to auscultation and percussion  with normal breathing effort HEART: regular rate & rhythm and no murmurs and no lower extremity edema ABDOMEN: Positive bowel sounds, soft, nontender NEURO: alert & oriented x 3 with fluent speech, no focal motor/sensory deficits  LABORATORY DATA:  I have reviewed the data as listed CMP Latest Ref Rng & Units 01/17/2021 01/16/2021 01/15/2021  Glucose 70 - 99 mg/dL 156(H) 163(H) 145(H)  BUN 6 - 20 mg/dL _0 Creatinine 0.44 - 1.00 mg/dL <0.30(L) 0.49 0.45  Sodium 135 - 145 mmol/L 136 137 137  Potassium 3.5 - 5.1 mmol/L 4.0 4.6 3.7  Chloride 98 - 111 mmol/L 102 104 102  CO2 22 - 32 mmol/L _1 Calcium 8.9 - 10.3 mg/dL 8.6(L) 8.7(L) 8.6(L)  Total Protein 6.5 - 8.1 g/dL 6.3(L) 6.4(L) 7.1  Total Bilirubin 0.3 - 1.2 mg/dL 0.3 0.2(L) 0.4  Alkaline Phos 38 - 126 U/L 85 98 123  AST 15 - 41 U/L 19 25 41  ALT 0 - 44 U/L 101(H) 128(H) 168(H)    Lab Results  Component Value Date   WBC 18.3 (H) 01/17/2021   HGB 12.0 01/17/2021   HCT 35.7 (L) 01/17/2021   MCV 85.0 01/17/2021   PLT 448 (H) 01/17/2021   NEUTROABS 11.7 (H) 01/17/2021    CT CHEST W CONTRAST  Result Date: 01/09/2021 CLINICAL DATA:  Fever of unknown origin, prominent spleen EXAM: CT CHEST WITH CONTRAST TECHNIQUE: Multidetector CT imaging of the chest was performed during intravenous contrast administration. CONTRAST:  38m OMNIPAQUE IOHEXOL 300 MG/ML  SOLN COMPARISON:  None. FINDINGS: Cardiovascular: Heart is normal in size.  No pericardial effusion. No evidence of  thoracic aortic aneurysm. Mediastinum/Nodes: No suspicious mediastinal lymphadenopathy. Small bilateral axillary nodes measuring up to 8 mm short axis on the left (series 2/image 26), likely reactive. Triangular soft tissue in the anterior mediastinum (series 2/image 49) reflects residual thymus. Visualized thyroid is unremarkable. Lungs/Pleura: Lungs are clear. No suspicious pulmonary nodules. No focal consolidation. Trace right pleural effusion (series 2/image 110). No  pneumothorax. Upper Abdomen: Visualized upper abdomen is unchanged from recent CT abdomen/pelvis, noting a prominent spleen. Musculoskeletal: Visualized osseous structures are within normal limits. IMPRESSION: Trace right pleural effusion. Otherwise negative CT chest. Electronically Signed   By: Julian Hy M.D.   On: 01/09/2021 10:39   CT ANGIO CHEST PE W OR WO CONTRAST  Result Date: 01/16/2021 CLINICAL DATA:  20 year old female with chest pain and shortness of breath. EXAM: CT ANGIOGRAPHY CHEST WITH CONTRAST TECHNIQUE: Multidetector CT imaging of the chest was performed using the standard protocol during bolus administration of intravenous contrast. Multiplanar CT image reconstructions and MIPs were obtained to evaluate the vascular anatomy. CONTRAST:  183m OMNIPAQUE IOHEXOL 350 MG/ML SOLN COMPARISON:  Chest CT dated 01/09/2021. FINDINGS: Cardiovascular: There is no cardiomegaly or pericardial effusion. The thoracic aorta is unremarkable. No pulmonary artery embolus identified. Mediastinum/Nodes: There is no hilar or mediastinal adenopathy. The esophagus and the thyroid gland are grossly unremarkable. No mediastinal fluid collection. Lungs/Pleura: The lungs are clear. There is no pleural effusion or pneumothorax. The central airways are patent. Upper Abdomen: No acute abnormality. Musculoskeletal: No chest wall abnormality. No acute or significant osseous findings. Review of the MIP images confirms the above findings. IMPRESSION: No acute intrathoracic pathology. No CT evidence of pulmonary embolism. Electronically Signed   By: AAnner CreteM.D.   On: 01/16/2021 22:20   [place]elvis Complete  Result Date: 01/04/2021 CLINICAL DATA:  Initial evaluation for acute left pelvic pain, evaluate for torsion. EXAM: TRANSABDOMINAL ULTRASOUND OF PELVIS DOPPLER ULTRASOUND OF OVARIES TECHNIQUE: Transabdominal ultrasound examination of the pelvis was performed including evaluation of the uterus, ovaries, adnexal  regions, and pelvic cul-de-sac. Color and duplex Doppler ultrasound was utilized to evaluate blood flow to the ovaries. COMPARISON:  Prior CT from earlier the same day. FINDINGS: Uterus Measurements: 6.1 x 2.9 x 4.0 cm = volume: 37.7 mL. Uterus is retroverted. No discrete fibroid or other mass. Endometrium Thickness: 5.0 mm.  No focal abnormality visualized. Right ovary Measurements: 3.1 x 1.5 x 2.2 cm = volume: 5.4 mL. Normal appearance/no adnexal mass. Left ovary Measurements: 3.4 x 2.2 x 2.7 cm = volume: 11.3 mL. Normal appearance/no adnexal mass. 1.8 cm dominant follicle noted. Pulsed Doppler evaluation demonstrates normal low-resistance arterial and venous waveforms in both ovaries. Other: Trace free fluid within the pelvis, likely physiologic. IMPRESSION: 1. 1.8 cm left ovarian dominant follicle, which could contribute to left-sided pelvic pain. Associated trace free fluid within the pelvis. 2. Otherwise unremarkable and normal pelvic ultrasound. No evidence for ovarian torsion. Electronically Signed   By: BJeannine BogaM.D.   On: 01/04/2021 19:58   CT ABDOMEN PELVIS W CONTRAST  Result Date: 01/11/2021 CLINICAL DATA:  Abdominal pain, fever, rash EXAM: CT ABDOMEN AND PELVIS WITH CONTRAST TECHNIQUE: Multidetector CT imaging of the abdomen and pelvis was performed using the standard protocol following bolus administration of intravenous contrast. CONTRAST:  742mOMNIPAQUE IOHEXOL 300 MG/ML  SOLN COMPARISON:  01/07/2021 FINDINGS: Lower chest: No acute pleural or parenchymal lung disease. Hepatobiliary: Interval progression of the gallbladder wall thickening and pericholecystic fluid seen on prior ultrasound. Additionally, periportal edema has developed. No  focal liver parenchymal abnormalities. No biliary dilation. Pancreas: Unremarkable. No pancreatic ductal dilatation or surrounding inflammatory changes. Spleen: Stable splenomegaly without focal parenchymal abnormality. Adrenals/Urinary Tract: Adrenal  glands are unremarkable. Kidneys are normal, without renal calculi, focal lesion, or hydronephrosis. Bladder is unremarkable. Stomach/Bowel: No bowel obstruction or ileus. Normal appendix right lower quadrant. No bowel wall thickening or inflammatory change. Vascular/Lymphatic: No significant vascular findings are present. Stable retroperitoneal lymphadenopathy, largest lymph node in the left para-aortic region measuring up to 21 x 11 mm. Stable borderline enlarged bilateral inguinal lymph nodes. Reproductive: Uterus and bilateral adnexa are unremarkable. Other: There is a small amount of ascites within the lower pelvis, increased in volume since prior study. No free intraperitoneal gas. No abdominal wall hernia. Musculoskeletal: No acute or destructive bony lesions. Reconstructed images demonstrate no additional findings. IMPRESSION: 1. Progressive gallbladder wall thickening and pericholecystic fluid, with development of periportal edema. Findings could reflect acalculous cholecystitis, progressive hepatic dysfunction, or hepatitis. 2. Splenomegaly and retroperitoneal lymphadenopathy, not appreciably changed. Findings could be reactive and related to underlying infection or due to lymphoproliferative disorder. 3. Small volume ascites within the lower pelvis, increased in volume since prior study. Electronically Signed   By: Randa Ngo M.D.   On: 01/11/2021 21:43   CT ABDOMEN PELVIS W CONTRAST  Result Date: 01/07/2021 CLINICAL DATA:  20 year old with abdominal pain. Suspect pyelonephritis. EXAM: CT ABDOMEN AND PELVIS WITH CONTRAST TECHNIQUE: Multidetector CT imaging of the abdomen and pelvis was performed using the standard protocol following bolus administration of intravenous contrast. CONTRAST:  129m OMNIPAQUE IOHEXOL 300 MG/ML  SOLN COMPARISON:  CT renal stone protocol 01/04/2021 and pelvic ultrasound 01/04/2021 FINDINGS: Lower chest: Lung bases are clear.  No pleural effusions. Hepatobiliary: Normal  appearance of the liver, gallbladder and portal venous system. No biliary dilatation Pancreas: Unremarkable. No pancreatic ductal dilatation or surrounding inflammatory changes. Spleen: Spleen is prominent for size measuring 12.7 cm in the AP dimension. Adrenals/Urinary Tract: Normal adrenal glands. Normal appearance of both kidneys without hydronephrosis or perinephric edema. Normal appearance of the urinary bladder. Stomach/Bowel: Stomach is within normal limits. Appendix appears normal. No evidence of bowel wall thickening, distention, or inflammatory changes. Vascular/Lymphatic: Normal appearance of the abdominal aorta. Normal appearance of the venous structures. There is prominent periaortic soft tissue particularly along the left side of the aorta. Soft tissue measures up to 1.8 cm on sequence 2 image 35. There was a similar finding on the recent comparison CT. Findings are compatible with enlarged lymph nodes in this area. Small lymph nodes along the anterior aspect of the distal IVC. No significant pelvic lymph node enlargement. Symmetric lymph nodes along the pelvic sidewall measure 0.7 cm on sequence 2, image 68. Reproductive: 2.0 cm low-density structure in the left adnexa could represent a prominent follicle and compatible with the recent ultrasound findings. Normal appearance of the uterus. Other: Small amount of free fluid in the pelvis. Musculoskeletal: No acute bone abnormality. IMPRESSION: 1. No acute abnormality in the abdomen or pelvis. Specifically, no evidence for hydronephrosis or pyelonephritis. 2. Slightly prominent retroperitoneal lymph nodes particularly along the left side of the aorta. Spleen is also prominent for size. Findings are nonspecific but could be related to a lymphoproliferative process or infectious etiology such as mononucleosis. 3. Small amount of free fluid in the pelvis is likely physiologic. Electronically Signed   By: AMarkus DaftM.D.   On: 01/07/2021 09:56   NM Hepato  W/EF  Result Date: 01/11/2021 CLINICAL DATA:  Abdominal pain EXAM: NUCLEAR MEDICINE  HEPATOBILIARY IMAGING WITH GALLBLADDER EF TECHNIQUE: Sequential images of the abdomen were obtained out to 60 minutes following intravenous administration of radiopharmaceutical. After oral ingestion of Ensure, gallbladder ejection fraction was determined. At 60 min, normal ejection fraction is greater than 33%. RADIOPHARMACEUTICALS:  5.28 mCi Tc-70m Choletec IV COMPARISON:  Ultrasound Jan 10, 2021 FINDINGS: There is slightly delayed but uniform radiotracer uptake by the liver with delayed hepatic radiotracer clearance. Normal filling of the intrahepatic ducts, common bile duct. Gallbladder activity is visualized, consistent with patency of cystic duct (normal < 60 minutes). Additionally there is normal biliary to bowel transit (normal < 60 minutes), consistent with patent common bile duct. Ensure was administered and the gallbladder appears to empty normally on sequential images. Calculated gallbladder ejection fraction is 29%. (Normal gallbladder ejection fraction with Ensure is greater than 33%.) No evidence of enterogastric biliary reflux. IMPRESSION: Slightly delayed hepatic radiotracer uptake with delayed hepatic clearance of radiotracer, suggestive of hepatocellular dysfunction/hepatitis. In the setting of hepatocellular dysfunction the evaluation of cholecystitis by HIDA is somewhat limited. Within this context there is appropriate filling of the gallbladder before 60 minutes indicating cystic duct patency excluding acute cholecystitis. However, there is a slightly reduced calculated gallbladder ejection fraction, which is at least in part artifactual given the persistent background hepatic counts projecting in the gallbladder fossa and delayed excretion of radiotracer into the biliary system, as such this study is equivocal in evaluation for chronic cholecystitis/biliary dyskinesia. Electronically Signed   By: JDahlia BailiffMD   On: 01/11/2021 15:47   [place]Abdomen Limited  Result Date: 01/09/2021 CLINICAL DATA:  Left upper quadrant pain EXAM: ULTRASOUND SPLEEN COMPARISON:  None. FINDINGS: Spleen measures 13.3 x 8.1 x 11.9 cm with a measures splenic volume of 670 cubic cm. No focal splenic lesions are evident. No perisplenic fluid or adenopathy. IMPRESSION: Prominent spleen without focal splenic lesion evident. Electronically Signed   By: WLowella GripIII M.D.   On: 01/09/2021 07:56   [place]Art/Ven Flow Abd Pelv Doppler  Result Date: 01/04/2021 CLINICAL DATA:  Initial evaluation for acute left pelvic pain, evaluate for torsion. EXAM: TRANSABDOMINAL ULTRASOUND OF PELVIS DOPPLER ULTRASOUND OF OVARIES TECHNIQUE: Transabdominal ultrasound examination of the pelvis was performed including evaluation of the uterus, ovaries, adnexal regions, and pelvic cul-de-sac. Color and duplex Doppler ultrasound was utilized to evaluate blood flow to the ovaries. COMPARISON:  Prior CT from earlier the same day. FINDINGS: Uterus Measurements: 6.1 x 2.9 x 4.0 cm = volume: 37.7 mL. Uterus is retroverted. No discrete fibroid or other mass. Endometrium Thickness: 5.0 mm.  No focal abnormality visualized. Right ovary Measurements: 3.1 x 1.5 x 2.2 cm = volume: 5.4 mL. Normal appearance/no adnexal mass. Left ovary Measurements: 3.4 x 2.2 x 2.7 cm = volume: 11.3 mL. Normal appearance/no adnexal mass. 1.8 cm dominant follicle noted. Pulsed Doppler evaluation demonstrates normal low-resistance arterial and venous waveforms in both ovaries. Other: Trace free fluid within the pelvis, likely physiologic. IMPRESSION: 1. 1.8 cm left ovarian dominant follicle, which could contribute to left-sided pelvic pain. Associated trace free fluid within the pelvis. 2. Otherwise unremarkable and normal pelvic ultrasound. No evidence for ovarian torsion. Electronically Signed   By: BJeannine BogaM.D.   On: 01/04/2021 19:58   DG CHEST PORT 1 VIEW  Result Date:  01/16/2021 CLINICAL DATA:  IBS and anxiety left flank pain EXAM: PORTABLE CHEST 1 VIEW COMPARISON:  01/13/2021 FINDINGS: The heart size and mediastinal contours are within normal limits. Both lungs are clear. The  visualized skeletal structures are unremarkable. IMPRESSION: No active disease. Electronically Signed   By: Donavan Foil M.D.   On: 01/16/2021 19:44   DG CHEST PORT 1 VIEW  Result Date: 01/13/2021 CLINICAL DATA:  20 y/o female inpatient with chest pain that started on April 30th per patient Pt denies being a smoker Pt denies SOB Pt states pain in midline upper chest Hx of Asthma EXAM: PORTABLE CHEST 1 VIEW COMPARISON:  None. FINDINGS: Normal mediastinum and cardiac silhouette. Normal pulmonary vasculature. No evidence of effusion, infiltrate, or pneumothorax. No acute bony abnormality. IMPRESSION: Normal chest radiograph Electronically Signed   By: Suzy Bouchard M.D.   On: 01/13/2021 14:33   ECHOCARDIOGRAM COMPLETE  Result Date: 01/10/2021    ECHOCARDIOGRAM REPORT   Patient Name:   [facility] Date of Exam: 01/10/2021 Medical Rec #:  [id number]     Height:       62.0 in Accession #:    [id number]    Weight:       135.0 lb Date of Birth:  [birth year]     BSA:          1.618 m Patient Age:    20 years      BP:           104/84 mmHg Patient Gender: F             HR:           110 bpm. Exam Location:  Inpatient Procedure: 2D Echo, Cardiac Doppler and Color Doppler Indications:    Fever R50.9  History:        Patient has no prior history of Echocardiogram examinations.  Sonographer:    Jonelle Sidle Dance Referring Phys: [id number] CORNELIUS N VAN DAM  Sonographer Comments: Reading Cardiologist notified. IMPRESSIONS  1. Left ventricular ejection fraction, by estimation, is 60 to 65%. The left ventricle has normal function. The left ventricle has no regional wall motion abnormalities. Left ventricular diastolic parameters were normal.  2. Right ventricular systolic function is normal. The right ventricular size is  normal.  3. The mitral valve is normal in structure. No evidence of mitral valve regurgitation. No evidence of mitral stenosis.  4. Possible vegetation on tricuspid valve, 1.2 x 0.4cm mobile, septal leaflet. Consider TEE for further clarification. The tricuspid valve is abnormal.  5. The aortic valve is normal in structure. Aortic valve regurgitation is not visualized. No aortic stenosis is present.  6. The inferior vena cava is normal in size with greater than 50% respiratory variability, suggesting right atrial pressure of 3 mmHg. FINDINGS  Left Ventricle: Left ventricular ejection fraction, by estimation, is 60 to 65%. The left ventricle has normal function. The left ventricle has no regional wall motion abnormalities. The left ventricular internal cavity size was normal in size. There is  no left ventricular hypertrophy. Left ventricular diastolic parameters were normal. Right Ventricle: The right ventricular size is normal. No increase in right ventricular wall thickness. Right ventricular systolic function is normal. Left Atrium: Left atrial size was normal in size. Right Atrium: Right atrial size was normal in size. Pericardium: There is no evidence of pericardial effusion. Mitral Valve: The mitral valve is normal in structure. No evidence of mitral valve regurgitation. No evidence of mitral valve stenosis. Tricuspid Valve: Possible vegetation on tricuspid valve, 1.2 x 0.4cm mobile, septal leaflet. Consider TEE for further clarification. The tricuspid valve is abnormal. Tricuspid valve regurgitation is not demonstrated. No evidence of tricuspid stenosis. Aortic Valve: The aortic valve is  normal in structure. Aortic valve regurgitation is not visualized. No aortic stenosis is present. Pulmonic Valve: The pulmonic valve was normal in structure. Pulmonic valve regurgitation is not visualized. No evidence of pulmonic stenosis. Aorta: The aortic root is normal in size and structure. Venous: The inferior vena cava  is normal in size with greater than 50% respiratory variability, suggesting right atrial pressure of 3 mmHg. IAS/Shunts: No atrial level shunt detected by color flow Doppler.  LEFT VENTRICLE PLAX 2D LVIDd:         3.60 cm  Diastology LVIDs:         2.70 cm  LV e' medial:    12.00 cm/s LV PW:         1.00 cm  LV E/e' medial:  8.8 LV IVS:        0.70 cm  LV e' lateral:   19.10 cm/s LVOT diam:     1.70 cm  LV E/e' lateral: 5.5 LV SV:         35 LV SV Index:   22 LVOT Area:     2.27 cm  RIGHT VENTRICLE             IVC RV Basal diam:  2.20 cm     IVC diam: 1.80 cm RV S prime:     12.30 cm/s TAPSE (M-mode): 2.5 cm LEFT ATRIUM             Index       RIGHT ATRIUM          Index LA diam:        3.10 cm 1.92 cm/m  RA Area:     8.27 cm LA Vol (A2C):   38.9 ml 24.05 ml/m RA Volume:   15.70 ml 9.71 ml/m LA Vol (A4C):   22.7 ml 14.03 ml/m LA Biplane Vol: 29.6 ml 18.30 ml/m  AORTIC VALVE LVOT Vmax:   109.00 cm/s LVOT Vmean:  66.600 cm/s LVOT VTI:    0.154 m  AORTA Ao Root diam: 2.40 cm Ao Asc diam:  2.40 cm MITRAL VALVE MV Area (PHT): 3.85 cm     SHUNTS MV Decel Time: 197 msec     Systemic VTI:  0.15 m MV E velocity: 106.00 cm/s  Systemic Diam: 1.70 cm MV A velocity: 76.40 cm/s MV E/A ratio:  1.39 Candee Furbish MD Electronically signed by Candee Furbish MD Signature Date/Time: 01/10/2021/10:32:01 AM    Final    CT Renal Stone Study  Result Date: 01/04/2021 CLINICAL DATA:  Two days of left-sided flank pain, kidney stone suspected EXAM: CT ABDOMEN AND PELVIS WITHOUT CONTRAST TECHNIQUE: Multidetector CT imaging of the abdomen and pelvis was performed following the standard protocol without IV contrast. COMPARISON:  None. FINDINGS: Lower chest: No acute abnormality. Normal size heart. No significant pericardial effusion/thickening. Hepatobiliary: Unremarkable noncontrast appearance of the hepatic parenchyma. Gallbladder is grossly unremarkable. No biliary ductal dilation. Pancreas: Within normal limits. Spleen: Within normal  limits Adrenals/Urinary Tract: Adrenal glands are unremarkable. No renal, ureteral or bladder calculus visualized. Kidneys are normal, contour deforming lesion, or hydronephrosis. Bladder is unremarkable for degree of distension. Stomach/Bowel: Stomach is within normal limits. Appendix is not discretely visualized however there is no pericecal inflammation. No evidence of bowel wall thickening, distention, or inflammatory changes. Vascular/Lymphatic: No significant vascular findings are present. No enlarged abdominal or pelvic lymph nodes. Reproductive: Uterus and right adnexa are unremarkable. 2.3 cm left adnexal cyst. No follow-up imaging recommended. Note: This recommendation does not apply to premenarchal  patients and to those with increased risk (genetic, family history, elevated tumor markers or other high-risk factors) of ovarian cancer. Reference: JACR 2020 Feb; 17(2):248-254 Other: Trace pelvic free fluid, likely physiologic. Musculoskeletal: No acute or significant osseous findings. IMPRESSION: No acute abdominopelvic findings. Specifically, no evidence of obstructive uropathy. Electronically Signed   By: Dahlia Bailiff MD   On: 01/04/2021 17:29   ECHO TEE  Result Date: 01/12/2021    TRANSESOPHOGEAL ECHO REPORT   Patient Name:   [facility] Date of Exam: 01/12/2021 Medical Rec #:  [id number]     Height:       62.0 in Accession #:    [id number]    Weight:       135.0 lb Date of Birth:  December 15, 2000     BSA:          1.618 m Patient Age:    20 years      BP:           119/81 mmHg Patient Gender: F             HR:           126 bpm. Exam Location:  Inpatient Procedure: Transesophageal Echo and Color Doppler Indications:     Bacteremia  History:         Patient has no prior history of Echocardiogram examinations,                  most recent 01/10/2021.  Sonographer:     Clayton Lefort RDCS (AE) Referring Phys:  [id number] Leanor Kail Diagnosing Phys: Candee Furbish MD PROCEDURE: After discussion of the risks and  benefits of a TEE, an informed consent was obtained from the patient. The transesophogeal probe was passed without difficulty through the esophogus of the patient. Sedation performed by performing physician. Patients was under conscious sedation during this procedure. Anesthetic administered: 59mg of Fentanyl, 4.045mof Versed. Image quality was good. The patient's vital signs; including heart rate, blood pressure, and oxygen saturation; remained stable throughout the procedure. The patient developed no complications during the procedure. IMPRESSIONS  1. Left ventricular ejection fraction, by estimation, is 60 to 65%. The left ventricle has normal function. The left ventricle has no regional wall motion abnormalities.  2. Right ventricular systolic function is normal. The right ventricular size is normal.  3. No left atrial/left atrial appendage thrombus was detected.  4. The mitral valve is normal in structure. No evidence of mitral valve regurgitation. No evidence of mitral stenosis.  5. Normal tricuspid valve. No evidence of vegetation.  6. The aortic valve is normal in structure. Aortic valve regurgitation is not visualized. No aortic stenosis is present.  7. The inferior vena cava is normal in size with greater than 50% respiratory variability, suggesting right atrial pressure of 3 mmHg. Conclusion(s)/Recommendation(s): Normal biventricular function without evidence of hemodynamically significant valvular heart disease. No evidence of vegetation/infective endocarditis on this transesophageal echocardiogram. FINDINGS  Left Ventricle: Left ventricular ejection fraction, by estimation, is 60 to 65%. The left ventricle has normal function. The left ventricle has no regional wall motion abnormalities. The left ventricular internal cavity size was normal in size. There is  no left ventricular hypertrophy. Right Ventricle: The right ventricular size is normal. No increase in right ventricular wall thickness. Right  ventricular systolic function is normal. Left Atrium: Left atrial size was normal in size. No left atrial/left atrial appendage thrombus was detected. Right Atrium: Right atrial size was normal in size. Pericardium: There is  no evidence of pericardial effusion. Mitral Valve: The mitral valve is normal in structure. No evidence of mitral valve regurgitation. No evidence of mitral valve stenosis. Tricuspid Valve: Normal tricuspid valve. No evidence of vegetation. The tricuspid valve is normal in structure. Tricuspid valve regurgitation is not demonstrated. No evidence of tricuspid stenosis. Aortic Valve: The aortic valve is normal in structure. Aortic valve regurgitation is not visualized. No aortic stenosis is present. Pulmonic Valve: The pulmonic valve was normal in structure. Pulmonic valve regurgitation is not visualized. No evidence of pulmonic stenosis. Aorta: The aortic root is normal in size and structure. Venous: The inferior vena cava is normal in size with greater than 50% respiratory variability, suggesting right atrial pressure of 3 mmHg. IAS/Shunts: No atrial level shunt detected by color flow Doppler. Candee Furbish MD Electronically signed by Candee Furbish MD Signature Date/Time: 01/12/2021/3:52:02 PM    Final    US Abdomen Limited RUQ (LIVER/GB)  Result Date: 01/10/2021 CLINICAL DATA:  Elevated liver enzymes with epigastric region pain EXAM: ULTRASOUND ABDOMEN LIMITED RIGHT UPPER QUADRANT COMPARISON:  CT abdomen and pelvis January 07, 2021 FINDINGS: Gallbladder: No evident gallstones. Gallbladder wall appears thickened. No pericholecystic fluid no sonographic Murphy sign noted by sonographer. Common bile duct: Diameter: 5 mm. No intrahepatic or extrahepatic biliary duct dilatation. Liver: No focal lesion identified. Within normal limits in parenchymal echogenicity. Portal vein is patent on color Doppler imaging with normal direction of blood flow towards the liver. Other: None. IMPRESSION: Thickening of the  gallbladder wall noted. No gallstones evident. Question a degree of acalculus cholecystitis. This finding may warrant nuclear medicine hepatobiliary imaging study to assess cystic duct patency. Study otherwise unremarkable. Electronically Signed   By: Lowella Grip III M.D.   On: 01/10/2021 14:10    ASSESSMENT AND PLAN: 1.  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  Lymphadenopathy secondary #1 3.  Splenomegaly secondary to #1 4.  Leukocytosis and thrombocytosis, likely reactive 5.  Fever, resolved 6.  Rash secondary to #1 7.  Abdominal pain, resolved 8.  Acute liver injury, improved  -The patient presented with a constellation of symptoms including rash, fever, lymphadenopathy, splenomegaly, eosinophilia, pruritus, acute liver injury.  Unifying diagnosis appears to be DRESS. -The patient has leukocytosis.  There were noted to be some abnormal lymphocytes on the technologist review of the smear yesterday.  However, technologist review of smear today shows benign, reactive lymphocytes with smudge cells.  There are no myelocytes, blasts, or abnormal lymphocytes noted today.  Pathology review of peripheral blood smear in process.  These findings are likely reactive.  Will independently review peripheral blood smear during rounds in the morning. -Continue steroids per hospitalist.    LOS: 8 days   Mikey Bussing, DNP, AGPCNP-BC, AOCNP 01/17/21   ADDENDUM: I looked at the blood smear this morning.  She does have some myelocytes.  Again the blood smear constantly be reactive.  However, given the whole scenario of what is going on, I do think that we will get have to get a bone marrow biopsy on her now.  I talked to her about this.  She is agreeable.  We will try to get this tomorrow.  There is no fever which is good.  Still looks like she has had a severe allergic reaction.  I would not say this is a Stevens-Johnson type of situation but it certainly is significant  with respect to her rash.  She has a great attitude.  She is doing her best.  She is eating okay.  She is having no nausea or vomiting.  She has had no bowel movement for 3 days.  Again, we will now get the bone marrow biopsy on her.  Again, I have to believe this is going to be negative for any underlying hematologic issue.  However, given the blood smear in the platelets, it is possible she may have a myeloproliferative process.  At her age, chronic myeloid leukemia is always a possibility.  Lattie Haw, M D  Psalm 145:2

## 2021-01-17 NOTE — Progress Notes (Addendum)
PROGRESS NOTE    Alexandra Young  TGG:[id number] DOB: [birth year] DOA: 01/07/2021 PCP: Aretha Parrot, NP    Chief Complaint  Patient presents with  . Pelvic Pain  . Fever    Brief Narrative:  Rayvin Abid is Medardo Hassing 20 y.o. female with Alline Pio history of IBS and anxiety. She presented secondary to left flank pain. She was found to have significant fevers and non-specific findings on CT scan including Treyvin Glidden prominent spleen and retroperitoneal lymph nodes.  After admission, she was noted to have eosinophilia and Junelle Hashemi diffuse rash.  Her history was notable for recently started lamictal within 1.5 months of her admission.  Concerning for DRESS.  Being treated with steroids.  She's had improvement in her LFTs and eosinophilia.  Her rash has been relatively stable if not slightly worsening (though the mucosal involvement has improved).  As she's had several days of high dose steroids at this point without clear improvement in rash, will plan for transfer to facility with derm.  [facility] without beds at this time (no list to place patient on).  [facility] without beds, but she's been placed on list for transfer if possible.  If she starts to clearly improve before time of transfer, can call [facility] for rapid follow up appointment   Assessment & Plan:   Principal Problem:   DRESS syndrome Active Problems:   Intractable nausea and vomiting   Intractable pain   Pelvic pain   Fever   Splenomegaly   Elevated LFTs   Epigastric pain   Left flank pain   Rash   Endocarditis of tricuspid valve   Transaminitis   Bipolar affective disorder, current episode mixed (HCC)   Attention deficit disorder  Drug Reaction with Eosinophilia and Systemic Symptoms (DRESS)  Diffuse Maculopapular Rash Acute Liver Injury  Eosinophilia  Pruritis  Likely unifying diagnosis - presented with L flank pain, intermittent fevers, imaging notable for prominent spleen/retroperitoneal LAD, she developed rash on 5/1, eosinophilia noted on 5/3,  developed acute liver injury, history notable for lamictal being started about 1.5 mo prior to admission, also has posterior auricular/cervical/occipital LAD Started on lamictal about 1.5 months ago during psychiatric hospitalization Lamictal is high risk drug for dress Pathologist smear review -> lymphocytosis, monocytosis and eosinophilia. Absolute eosinophils normalized Rash is diffuse, more confluent on torso and proximal extremities, spreading, blanching, has not yet improved, though mucosal involvement is continuing to improve.  She does not have any blisters, bullae, sloughing lesions.  LFT's improving -> (AST wnl, ALT improving) normal platelets.  No signs of encephalopathy. Consider punch biopsy with persistent rash despite steroids, though history, offending med seem typical Needs thyroid function tests in about 6 weeks (follow TSH tomorrow) Will need Aj Crunkleton slow prolonged steroid taper (over at least 4-6 weeks) Will need to avoid any medications high risk for dress Stop lamictal, follow (abd pain improved after stopping lamictal).  She's received 2 doses of ibuprofen, stop (NSAIDs lower risk drug).  IV steroids (bump dose with spreading rash).  LFT's improving, eosinophilia resolved.  Oral mucosal involvement improved.  Vaginal involvement has improved/resolved -> discussed with dermatology at [facility] on [address] few occasions to review course -> noted sounds typical for DRESS (they noted 50% dress can have mucosal involvement) - recommended continue current plan of care and call back as needed if worsening.  Follow response.  Discussed with pt and mother, as rash is not clearly improving after several days of high dose steroids, will transfer to facility with dermatology availability (as above, no beds  at [facility] [place], but she's been placed on list at [facility]).  If her rash begins to clearly improve prior to her getting Anyela Napierkowski bed, consider arranging outpatient follow up with [facility] (call their consult line to  arrange with dermatology).  Shortness of breath RR last night for SOB/dizziness -> CT PE protocol negative, EKG without concerning findings ? Anxiety/panic attack Add ativan prn, will follow  Bilateral Upper Extremity Pain ? Superficial vein thrombosis to LUE.  I was unable to palpate anything significant on RUE. Follow bilateral upper extremity US  Abnormal Differential  Myelocyte  Smudge Cells Noted on diff 5/7, follow pathology smear (pending) None noted 5/8 Continue to follow CBC with diff Will ask hematology to comment again  Prediabetes  Hyperglycemia  Steroid Induced Follow A1c (5.8), POC BG's she and mom want to hold off on SSI for now, reasonable  Lymphadenopathy Likely 2/2 dress, follow with treatment Posterior cervical, posterior auricular, occipital LN's as well as retroperitoneal LAD  Chest Discomfort Mostly reproduced with palpation  She notes this has been intermittent since admission Resolved  CXR normal  EKG with sinus tach, appears similar to priors Will obtain troponin as well - negative TEE yesterday with normal EF, no RWMA (see report)  Fever of unknown origin Recurrent fever 5/4 PM Unrevealing workup so far -> DRESS seems to be unifying diagnosis HIDA scan today suggestive of hepatocellular dysfunction, hepatitis -> equivocal study in evaluation for chronic cholecystitis/biliary dyskinesia RUQ [place] with gallbladder thickenign CT chest with trace R effusion CT abdomen/pelvis without acute abnormality Prominent retroperitoneal LN along L side of aorta Pelvic US with 1.8 cm L ovarian dominant follicle, no evidence torsion Parvovirus B19 IgG positive Ehrlichia ab panel negative RMSF studies negative ANA negative RF negative RPR negative EBV ab panel suggestive of past infection CMV IgG positive, negative IgM Acute hepatitis panel negative Negative HIV RNA quant Histoplasma antigen pending  Echo with possible vegetation on tricuspid valve ->  follow TEE (normal EF, no RWMA, no evidence of vegetation/infective endocarditis - see report) Repeat CT abdomen pelvis 5/4 with gallbladder wall thickening and pericholecystic fluid with development of periportal edema (acalculous cholecystitis, hepatic dysfunction, or hepatitis), spenomegaly and retroperitoneal LAD, small volume ascites  Abdominal Pain Follow repeat CT scan today as above -> with gallbladder wall thickening and pericholecystic fluid (likely 2/2 hepatic dysfunction) will need follow up outpatient  Follow repeat lipase, mildly elevated Pain management Resolved after lamictal stopped  Bacterial Vaginosis Flagyl   Thrush Nystatin  Prominent Spleen Retroperitoneal Lymphadenopathy Heme onc was considering bone marrow bx, with alternative diagnoses above, I think reasonable to continue to hold off on this  SPEP without monoclonal protein IgM, IgA, IgG wnl Beta 2 microglobulin wnl  Anxiety  Depression Holding lamictal with above Buspar As holding lamictal, will c/s psych to discuss alternative meds -> abilify --- on hold, psych asked to review again with mom, they're going to follow outpatient - hold for now (see 5/10 note) She'll need close outpatient psych follow up with adjustments to meds  IBS   Allergies Singulair, claritin  Hypokalemia Replace and follow  DVT prophylaxis: lovenox Code Status: full  Family Communication: none at bedside - mother phone 5/7 and 5/8 Disposition:   Status is: Inpatient  Remains inpatient appropriate because:Inpatient level of care appropriate due to severity of illness   Dispo: The patient is from: Home              Anticipated d/c is to: Home  Patient currently is not medically stable to d/c.   Difficult to place patient No   Consultants:   ID  Cardiology  Procedures Echo IMPRESSIONS    1. Left ventricular ejection fraction, by estimation, is 60 to 65%. The  left ventricle has normal function.  The left ventricle has no regional  wall motion abnormalities. Left ventricular diastolic parameters were  normal.  2. Right ventricular systolic function is normal. The right ventricular  size is normal.  3. The mitral valve is normal in structure. No evidence of mitral valve  regurgitation. No evidence of mitral stenosis.  4. Possible vegetation on tricuspid valve, 1.2 x 0.4cm mobile, septal  leaflet. Consider TEE for further clarification. The tricuspid valve is  abnormal.  5. The aortic valve is normal in structure. Aortic valve regurgitation is  not visualized. No aortic stenosis is present.  6. The inferior vena cava is normal in size with greater than 50%  respiratory variability, suggesting right atrial pressure of 3 mmHg.  Antimicrobials:  Anti-infectives (From admission, onward)   Start     Dose/Rate Route Frequency Ordered Stop   01/13/21 1000  fluconazole (DIFLUCAN) tablet 150 mg  Status:  Discontinued        150 mg Oral Daily 01/13/21 0709 01/14/21 1320   01/13/21 1000  metroNIDAZOLE (FLAGYL) tablet 500 mg        500 mg Oral 2 times daily 01/13/21 0833 01/20/21 0959   01/10/21 1645  fluconazole (DIFLUCAN) tablet 150 mg        150 mg Oral  Once 01/10/21 1554 01/10/21 1641   01/07/21 0715  cefTRIAXone (ROCEPHIN) 1 g in sodium chloride 0.9 % 100 mL IVPB        1 g 200 mL/hr over 30 Minutes Intravenous  Once 01/07/21 0710 01/07/21 0751     Subjective: IV is bothering her today, moved last night  Objective: Vitals:   01/16/21 2030 01/16/21 2033 01/17/21 0544 01/17/21 1530  BP:  124/84 117/62 129/83  Pulse:  79 72 99  Resp:  18 18 (!) 22  Temp:  (!) 97.5 F (36.4 C) 98.1 F (36.7 C) 98.4 F (36.9 C)  TempSrc:  Oral Oral   SpO2: 100%  99% 100%  Weight:      Height:       No intake or output data in the 24 hours ending 01/17/21 1655 Filed Weights   01/07/21 0600  Weight: 61.2 kg    Examination:  General: No acute distress. Posterior cervical, post  auricular, occiptial LNs Cardiovascular: Heart sounds show Jaxen Samples regular rate, and rhythm Lungs: Clear to auscultation bilaterally  Abdomen: Soft, nontender, nondistended  Neurological: Alert and oriented 3. Moves all extremities 4. Cranial nerves II through XII grossly intact. Skin: confluent morbilliform rash, most impressive to trunk, spreading plaques/papules down limbs.  No blisters, bullae, or sloughing.  Oral involvement resolved.  Deferred vaginal exam with report of continued improvement.   Extremities: No clubbing or cyanosis. No edema.      Data Reviewed: I have personally reviewed following labs and imaging studies  CBC: Recent Labs  Lab 01/12/21 0452 01/13/21 0418 01/14/21 0539 01/15/21 0746 01/16/21 0514 01/17/21 0532  WBC 13.9* 16.9* 19.2* 23.7* 23.2* 18.3*  NEUTROABS 4.2 5.0  --  11.5*  --  11.7*  HGB 12.5 11.2* 10.9* 11.6* 11.8* 12.0  HCT 36.7 33.4* 32.6* 34.5* 35.6* 35.7*  MCV 84.0 83.9 83.6 84.6 84.4 85.0  PLT 203 255 317 416* 406* 448*  Basic Metabolic Panel: Recent Labs  Lab 01/12/21 0452 01/13/21 0418 01/14/21 0539 01/15/21 0746 01/16/21 0514 01/17/21 0532  NA 137 138 143 137 137 136  K 3.2* 3.8 3.9 3.7 4.6 4.0  CL 105 104 109 102 104 102  CO2 GLUCOSE 105* 116* 147* 145* 163* 156*  BUN <5* CREATININE 0.51 0.56 0.39* 0.45 0.49 <0.30*  CALCIUM 8.1* 8.3* 8.6* 8.6* 8.7* 8.6*  MG 2.2 2.2 2.3  --  2.5* 2.4  PHOS 2.9 2.6 4.6  --  4.3 4.7*    GFR: CrCl cannot be calculated (This lab value cannot be used to calculate CrCl because it is not [place] number: <0.30).  Liver Function Tests: Recent Labs  Lab 01/13/21 0418 01/14/21 0539 01/15/21 0746 01/16/21 0514 01/17/21 0532  AST 114* 60* 41 25 19  ALT 190* 176* 168* 128* 101*  ALKPHOS 156* 138* 123 98 85  BILITOT 1.0 0.3 0.4 0.2* 0.3  PROT 6.0* 6.7 7.1 6.4* 6.3*  ALBUMIN 2.9* 3.3* 3.6 3.5 3.3*    CBG: Recent Labs  Lab 01/16/21 1707 01/16/21 2114  01/17/21 0746 01/17/21 1118 01/17/21 1650  GLUCAP 210* 204* 134* 237* 194*     Recent Results (from the past 240 hour(s))  Blastomyces Antigen     Status: None   Collection Time: 01/07/21  6:45 PM  Result Value Ref Range Status   Blastomyces Antigen None Detected None Detected ng/mL Final    Comment: (NOTE) Results reported as ng/mL in 0.2 - 14.7 ng/mL range Results above the limit of detection but below 0.2 ng/mL are reported as 'Positive, Below the Limit of Quantification' Results above 14.7 ng/mL are reported as 'Positive, Above the Limit of Quantification'    Specimen Type SERUM  Final    Comment: (NOTE) Performed At: [facility] [address] [place], [place] [id number] Phylis Bougie MD WU:[id number]          Radiology Studies: CT ANGIO CHEST PE W OR WO CONTRAST  Result Date: 01/16/2021 CLINICAL DATA:  20 year old female with chest pain and shortness of breath. EXAM: CT ANGIOGRAPHY CHEST WITH CONTRAST TECHNIQUE: Multidetector CT imaging of the chest was performed using the standard protocol during bolus administration of intravenous contrast. Multiplanar CT image reconstructions and MIPs were obtained to evaluate the vascular anatomy. CONTRAST:  OMNIPAQUE IOHEXOL 350 MG/ML SOLN COMPARISON:  Chest CT dated 01/09/2021. FINDINGS: Cardiovascular: There is no cardiomegaly or pericardial effusion. The thoracic aorta is unremarkable. No pulmonary artery embolus identified. Mediastinum/Nodes: There is no hilar or mediastinal adenopathy. The esophagus and the thyroid gland are grossly unremarkable. No mediastinal fluid collection. Lungs/Pleura: The lungs are clear. There is no pleural effusion or pneumothorax. The central airways are patent. Upper Abdomen: No acute abnormality. Musculoskeletal: No chest wall abnormality. No acute or significant osseous findings. Review of the MIP images confirms the above findings. IMPRESSION: No acute intrathoracic pathology.  No CT evidence of pulmonary embolism. Electronically Signed   By: Elgie Collard M.D.   On: 01/16/2021 22:20   DG CHEST PORT 1 VIEW  Result Date: 01/16/2021 CLINICAL DATA:  IBS and anxiety left flank pain EXAM: PORTABLE CHEST 1 VIEW COMPARISON:  01/13/2021 FINDINGS: The heart size and mediastinal contours are within normal limits. Both lungs are clear. The visualized skeletal structures are unremarkable. IMPRESSION: No active disease. Electronically Signed   By: Jasmine Pang M.D.   On: 01/16/2021 19:44  Scheduled Meds: . busPIRone  10 mg Oral BID  . enoxaparin (LOVENOX) injection  40 mg Subcutaneous Q24H  . loratadine  10 mg Oral Daily  . methylPREDNISolone (SOLU-MEDROL) injection  60 mg Intravenous Q6H  . metroNIDAZOLE  500 mg Oral BID  . montelukast  10 mg Oral QHS  . nystatin  5 mL Oral QID  . pantoprazole  40 mg Oral Daily  . sodium chloride flush  10-40 mL Intracatheter Q12H  . triamcinolone ointment   Topical TID   Continuous Infusions:    LOS: 8 days    Time spent: over 30 min    Lacretia Nicks, MD [facility]   To contact the attending provider between 7A-7P or the covering provider during after hours 7P-7A, please log into the web site [email or web address] and access using universal [place] password for that web site. If you do not have the password, please call the hospital operator.  01/17/2021, 4:55 PM

## 2021-01-18 ENCOUNTER — Ambulatory Visit: Payer: Medicaid Other | Admitting: Internal Medicine

## 2021-01-18 ENCOUNTER — Encounter (HOSPITAL_COMMUNITY): Payer: Self-pay | Admitting: Internal Medicine

## 2021-01-18 ENCOUNTER — Inpatient Hospital Stay (HOSPITAL_COMMUNITY): Payer: Medicaid Other

## 2021-01-18 DIAGNOSIS — M79601 Pain in right arm: Secondary | ICD-10-CM | POA: Diagnosis not present

## 2021-01-18 DIAGNOSIS — M79602 Pain in left arm: Secondary | ICD-10-CM | POA: Diagnosis not present

## 2021-01-18 LAB — COMPREHENSIVE METABOLIC PANEL
ALT: 90 U/L — ABNORMAL HIGH (ref 0–44)
AST: 23 U/L (ref 15–41)
Albumin: 3.4 g/dL — ABNORMAL LOW (ref 3.5–5.0)
Alkaline Phosphatase: 74 U/L (ref 38–126)
Anion gap: 8 (ref 5–15)
BUN: 16 mg/dL (ref 6–20)
CO2: 25 mmol/L (ref 22–32)
Calcium: 8.6 mg/dL — ABNORMAL LOW (ref 8.9–10.3)
Chloride: 102 mmol/L (ref 98–111)
Creatinine, Ser: 0.49 mg/dL (ref 0.44–1.00)
GFR, Estimated: >60 mL/min (ref >60)
Glucose, Bld: 159 mg/dL — ABNORMAL HIGH (ref 70–99)
Potassium: 4.7 mmol/L (ref 3.5–5.1)
Sodium: 135 mmol/L (ref 135–145)
Total Bilirubin: 0.3 mg/dL (ref 0.3–1.2)
Total Protein: 6.5 g/dL (ref 6.5–8.1)

## 2021-01-18 LAB — GLUCOSE, CAPILLARY
Glucose-Capillary: 122 mg/dL — ABNORMAL HIGH (ref 70–99)
Glucose-Capillary: 161 mg/dL — ABNORMAL HIGH (ref 70–99)
Glucose-Capillary: 133 mg/dL — ABNORMAL HIGH (ref 70–99)
Glucose-Capillary: 280 mg/dL — ABNORMAL HIGH (ref 70–99)

## 2021-01-18 LAB — CBC WITH DIFFERENTIAL/PLATELET
Abs Immature Granulocytes: 0.59 K/uL — ABNORMAL HIGH (ref 0.00–0.07)
Basophils Absolute: 0.1 K/uL (ref 0.0–0.1)
Basophils Relative: 1 %
Eosinophils Absolute: 0.6 K/uL — ABNORMAL HIGH (ref 0.0–0.5)
Eosinophils Relative: 4 %
HCT: 36.8 % (ref 36.0–46.0)
Hemoglobin: 12.3 g/dL (ref 12.0–15.0)
Immature Granulocytes: 4 %
Lymphocytes Relative: 15 %
Lymphs Abs: 2.2 K/uL (ref 0.7–4.0)
MCH: 28.8 pg (ref 26.0–34.0)
MCHC: 33.4 g/dL (ref 30.0–36.0)
MCV: 86.2 fL (ref 80.0–100.0)
Monocytes Absolute: 1.2 K/uL — ABNORMAL HIGH (ref 0.1–1.0)
Monocytes Relative: 8 %
Neutro Abs: 10.1 K/uL — ABNORMAL HIGH (ref 1.7–7.7)
Neutrophils Relative %: 68 %
Platelets: 474 K/uL — ABNORMAL HIGH (ref 150–400)
RBC: 4.27 MIL/uL (ref 3.87–5.11)
RDW: 14.7 % (ref 11.5–15.5)
WBC: 14.8 K/uL — ABNORMAL HIGH (ref 4.0–10.5)
nRBC: 0 % (ref 0.0–0.2)

## 2021-01-18 LAB — PHOSPHORUS: Phosphorus: 4.4 mg/dL (ref 2.5–4.6)

## 2021-01-18 LAB — TSH: TSH: 0.515 u[IU]/mL (ref 0.350–4.500)

## 2021-01-18 LAB — MAGNESIUM: Magnesium: 2.5 mg/dL — ABNORMAL HIGH (ref 1.7–2.4)

## 2021-01-18 MED ORDER — TRIAMCINOLONE 0.1 % CREAM:EUCERIN CREAM 1:1
1.0000 "application " | TOPICAL_CREAM | Freq: Two times a day (BID) | CUTANEOUS | Status: DC
  Administered 2021-01-18 – 2021-01-21 (×6): 1 via TOPICAL
  Filled 2021-01-18: qty 1

## 2021-01-18 MED ORDER — PREDNISONE 50 MG PO TABS: 60.0000 mg | ORAL_TABLET | Freq: Every day | ORAL | Status: DC

## 2021-01-18 MED ORDER — PREDNISONE 50 MG PO TABS
60.0000 mg | ORAL_TABLET | Freq: Every day | ORAL | Status: DC
  Administered 2021-01-18 – 2021-01-21 (×4): 60 mg via ORAL
  Filled 2021-01-18 (×4): qty 1

## 2021-01-18 NOTE — Progress Notes (Signed)
Upper extremity venous bilateral study completed.  Preliminary results relayed to Maryfrances Bunnell, MD.   See CV Proc for preliminary results report.   Jean Rosenthal, RDMS, RVT

## 2021-01-18 NOTE — H&P (Addendum)
Chief Complaint: Patient was seen in consultation today for image guided bone marrow biopsy and aspiration Chief Complaint  Patient presents with  . Pelvic Pain  . Fever   at the request of Dr. Myna Hidalgo  Referring Physician(s): Dr. Myna Hidalgo, P.  Supervising Physician: Malachy Moan  Patient Status: [facility] - In-pt  History of Present Illness: Alexandra Young is a 20 y.o. female with PMH of IBS, depression, asthma, anxiety who presented to ED with left flank pain.  Patient underwent CT scan which showed a left ovarian cyst, splenomegaly, and retroperitoneal lymphadenopathy, findings could be reactive and related to underlying infection or due to lymphoproliferative disorder.  Patient was admitted due to intractable left flank pain and fever of unknown origin.  Oncology was consulted due to CT findings suggesting possible lymphoproliferative disorder.  At the time of initial consultation, oncology suggested against a bone marrow biopsy.   Patient unfortunately developed a DRESS syndrome during this admission.  Per Dr. Gustavo Lah note,  blood smear showed benign, reactive lymphocytes with smudge cells, which most likely are reactive.  Blood smear was reviewed by Dr. Myna Hidalgo, who believes that the abnormal findings of the blood smear are most likely be reactive, however, given patient's recent development of DRESS syndrome, he would like to obtain a bone marrow biopsy for further evaluation.   IR was requested for bone marrow biopsy and aspiration.  Patient sitting in bed, not in acute distress.  Reports chronic nausea.  Denise headache, fever, chills, shortness of breath, cough, chest pain, abdominal pain,vomiting, and bleeding.   Past Medical History:  Diagnosis Date  . Anxiety   . Asthma   . Depression   . DRESS syndrome 01/12/2021  . IBS (irritable bowel syndrome)     Past Surgical History:  Procedure Laterality Date  . TEE WITHOUT CARDIOVERSION N/A 01/12/2021   Procedure:  TRANSESOPHAGEAL ECHOCARDIOGRAM (TEE);  Surgeon: Jake Bathe, MD;  Location: [facility];  Service: Cardiovascular;  Laterality: N/A;    Allergies: Patient has no known allergies.  Medications: Prior to Admission medications   Medication Sig Start Date End Date Taking? Authorizing Provider  albuterol (VENTOLIN HFA) 108 (90 Base) MCG/ACT inhaler Inhale 2 puffs into the lungs every 6 (six) hours as needed for wheezing or shortness of breath. 11/11/20  Yes [provider]  Aspirin-Acetaminophen-Caffeine (EXCEDRIN PO) Take 2 tablets by mouth daily as needed (headache).   Yes [provider]  busPIRone (BUSPAR) 10 MG tablet Take 10 mg by mouth 2 (two) times daily.   Yes [provider]  butalbital-acetaminophen-caffeine (FIORICET) 50-325-40 MG tablet Take 1 tablet by mouth every 6 (six) hours as needed for migraine. 07/02/19  Yes [provider]  cetirizine (ZYRTEC) 10 MG tablet Take 10 mg by mouth 2 (two) times daily.   Yes [provider]  etonogestrel (NEXPLANON) 68 MG IMPL implant 68 mg by Subdermal route continuous.   Yes [provider]  fluticasone (FLONASE) 50 MCG/ACT nasal spray Place 2 sprays into both nostrils daily as needed for allergies.   Yes [provider]  Galcanezumab-gnlm (EMGALITY) 120 MG/ML SOAJ Inject 120 mg into the skin every 30 (thirty) days. 12/30/20  Yes [provider]  HYDROcodone-acetaminophen (NORCO/VICODIN) 5-325 MG tablet Take 1 tablet by mouth every 8 (eight) hours as needed for moderate pain.   Yes [provider]  ibuprofen (ADVIL) 600 MG tablet Take 1 tablet (600 mg total) by mouth every 6 (six) hours as needed. Patient taking differently: Take 600 mg by  mouth every 6 (six) hours as needed for mild pain. 01/04/21  Yes Domenic Moras, PA-C  lamoTRIgine (LAMICTAL) 25 MG tablet Take 50 mg by mouth daily.   Yes [provider]  linaclotide (LINZESS) 145 MCG CAPS capsule Take 145 mcg  by mouth daily as needed (constipation).   Yes [provider]  loperamide (IMODIUM) 2 MG capsule Take 2 mg by mouth daily as needed for diarrhea or loose stools. 10/06/20  Yes [provider]  montelukast (SINGULAIR) 10 MG tablet Take 10 mg by mouth at bedtime. 11/06/19  Yes [provider]  omeprazole (PRILOSEC) 40 MG capsule Take 40 mg by mouth daily.   Yes [provider]  ondansetron (ZOFRAN) 4 MG tablet Take 1 tablet (4 mg total) by mouth every 8 (eight) hours as needed for nausea or vomiting. 01/04/21  Yes Domenic Moras, PA-C  polyethylene glycol (MIRALAX / GLYCOLAX) 17 g packet Take 17 g by mouth daily as needed for mild constipation.   Yes [provider]  traZODone (DESYREL) 50 MG tablet Take 50 mg by mouth at bedtime as needed for sleep. 12/12/20 03/12/21 Yes [provider]  Vitamin D, Ergocalciferol, (DRISDOL) 1.25 MG (50000 UNIT) CAPS capsule Take 50,000 Units by mouth every 7 (seven) days.   Yes [provider]  medroxyPROGESTERone (DEPO-PROVERA) 150 MG/ML injection Inject 150 mg into the muscle every 3 (three) months.  05/20/20  [provider]     Family History  Problem Relation Age of Onset  . Asthma Mother   . Thyroid disease Mother     Social History   Socioeconomic History  . Marital status: Single    Spouse name: Not on file  . Number of children: Not on file  . Years of education: Not on file  . Highest education level: Not on file  Occupational History  . Not on file  Tobacco Use  . Smoking status: Never Smoker  . Smokeless tobacco: Never Used  Vaping Use  . Vaping Use: Never used  Substance and Sexual Activity  . Alcohol use: Never  . Drug use: Never  . Sexual activity: Yes    Birth control/protection: Condom  Other Topics Concern  . Not on file  Social History Narrative  . Not on file   Social Determinants of Health   Financial Resource Strain: Not on file  Food Insecurity: Not on  file  Transportation Needs: Not on file  Physical Activity: Not on file  Stress: Not on file  Social Connections: Not on file     Review of Systems: A 12 point ROS discussed and pertinent positives are indicated in the HPI above.  All other systems are negative.   Vital Signs: BP 119/68   Pulse 83   Temp 98.9 F (37.2 C) (Oral)   Resp 18   Ht $R'5\' 2"'vB$  (1.575 m)   Wt 135 lb (61.2 kg)   LMP 01/06/2021 (Exact Date)   SpO2 99%   BMI 24.69 kg/m   Physical Exam  Vitals and nursing note reviewed.  Constitutional:      General: He is not in acute distress.    Appearance: Normal appearance.  HENT:     Head: Normocephalic and atraumatic.     Mouth/Throat:     Mouth: Mucous membranes are moist.     Pharynx: Oropharynx is clear.  Cardiovascular:     Rate and Rhythm: Normal rate and regular rhythm.     Pulses: Normal pulses.  Heart sounds: Normal heart sounds.  Pulmonary:     Effort: Pulmonary effort is normal.     Breath sounds: Normal breath sounds. No wheezing, rhonchi or rales.  Abdominal:     General: Bowel sounds are normal. There is no distension.     Palpations: Abdomen is soft.  Skin:    General: Skin is warm and dry.  Neurological:     Mental Status: He is alert and oriented to person, place, and time.  Psychiatric:        Mood and Affect: Mood normal.        Behavior: Behavior normal.    MD Evaluation Airway: WNL Heart: WNL Abdomen: WNL Chest/ Lungs: WNL  Imaging: CT CHEST W CONTRAST  Result Date: 01/09/2021 CLINICAL DATA:  Fever of unknown origin, prominent spleen EXAM: CT CHEST WITH CONTRAST TECHNIQUE: Multidetector CT imaging of the chest was performed during intravenous contrast administration. CONTRAST:  41mL OMNIPAQUE IOHEXOL 300 MG/ML  SOLN COMPARISON:  None. FINDINGS: Cardiovascular: Heart is normal in size.  No pericardial effusion. No evidence of thoracic aortic aneurysm. Mediastinum/Nodes: No suspicious mediastinal lymphadenopathy. Small  bilateral axillary nodes measuring up to 8 mm short axis on the left (series 2/image 26), likely reactive. Triangular soft tissue in the anterior mediastinum (series 2/image 49) reflects residual thymus. Visualized thyroid is unremarkable. Lungs/Pleura: Lungs are clear. No suspicious pulmonary nodules. No focal consolidation. Trace right pleural effusion (series 2/image 110). No pneumothorax. Upper Abdomen: Visualized upper abdomen is unchanged from recent CT abdomen/pelvis, noting a prominent spleen. Musculoskeletal: Visualized osseous structures are within normal limits. IMPRESSION: Trace right pleural effusion. Otherwise negative CT chest. Electronically Signed   By: Julian Hy M.D.   On: 01/09/2021 10:39   CT ANGIO CHEST PE W OR WO CONTRAST  Result Date: 01/16/2021 CLINICAL DATA:  20 year old female with chest pain and shortness of breath. EXAM: CT ANGIOGRAPHY CHEST WITH CONTRAST TECHNIQUE: Multidetector CT imaging of the chest was performed using the standard protocol during bolus administration of intravenous contrast. Multiplanar CT image reconstructions and MIPs were obtained to evaluate the vascular anatomy. CONTRAST:  128mL OMNIPAQUE IOHEXOL 350 MG/ML SOLN COMPARISON:  Chest CT dated 01/09/2021. FINDINGS: Cardiovascular: There is no cardiomegaly or pericardial effusion. The thoracic aorta is unremarkable. No pulmonary artery embolus identified. Mediastinum/Nodes: There is no hilar or mediastinal adenopathy. The esophagus and the thyroid gland are grossly unremarkable. No mediastinal fluid collection. Lungs/Pleura: The lungs are clear. There is no pleural effusion or pneumothorax. The central airways are patent. Upper Abdomen: No acute abnormality. Musculoskeletal: No chest wall abnormality. No acute or significant osseous findings. Review of the MIP images confirms the above findings. IMPRESSION: No acute intrathoracic pathology. No CT evidence of pulmonary embolism. Electronically Signed   By:  Anner Crete M.D.   On: 01/16/2021 22:20   US Pelvis Complete  Result Date: 01/04/2021 CLINICAL DATA:  Initial evaluation for acute left pelvic pain, evaluate for torsion. EXAM: TRANSABDOMINAL ULTRASOUND OF PELVIS DOPPLER ULTRASOUND OF OVARIES TECHNIQUE: Transabdominal ultrasound examination of the pelvis was performed including evaluation of the uterus, ovaries, adnexal regions, and pelvic cul-de-sac. Color and duplex Doppler ultrasound was utilized to evaluate blood flow to the ovaries. COMPARISON:  Prior CT from earlier the same day. FINDINGS: Uterus Measurements: 6.1 x 2.9 x 4.0 cm = volume: 37.7 mL. Uterus is retroverted. No discrete fibroid or other mass. Endometrium Thickness: 5.0 mm.  No focal abnormality visualized. Right ovary Measurements: 3.1 x 1.5 x 2.2 cm = volume: 5.4 mL. Normal appearance/no  adnexal mass. Left ovary Measurements: 3.4 x 2.2 x 2.7 cm = volume: 11.3 mL. Normal appearance/no adnexal mass. 1.8 cm dominant follicle noted. Pulsed Doppler evaluation demonstrates normal low-resistance arterial and venous waveforms in both ovaries. Other: Trace free fluid within the pelvis, likely physiologic. IMPRESSION: 1. 1.8 cm left ovarian dominant follicle, which could contribute to left-sided pelvic pain. Associated trace free fluid within the pelvis. 2. Otherwise unremarkable and normal pelvic ultrasound. No evidence for ovarian torsion. Electronically Signed   By: Jeannine Boga M.D.   On: 01/04/2021 19:58   CT ABDOMEN PELVIS W CONTRAST  Result Date: 01/11/2021 CLINICAL DATA:  Abdominal pain, fever, rash EXAM: CT ABDOMEN AND PELVIS WITH CONTRAST TECHNIQUE: Multidetector CT imaging of the abdomen and pelvis was performed using the standard protocol following bolus administration of intravenous contrast. CONTRAST:  79mL OMNIPAQUE IOHEXOL 300 MG/ML  SOLN COMPARISON:  01/07/2021 FINDINGS: Lower chest: No acute pleural or parenchymal lung disease. Hepatobiliary: Interval progression of the  gallbladder wall thickening and pericholecystic fluid seen on prior ultrasound. Additionally, periportal edema has developed. No focal liver parenchymal abnormalities. No biliary dilation. Pancreas: Unremarkable. No pancreatic ductal dilatation or surrounding inflammatory changes. Spleen: Stable splenomegaly without focal parenchymal abnormality. Adrenals/Urinary Tract: Adrenal glands are unremarkable. Kidneys are normal, without renal calculi, focal lesion, or hydronephrosis. Bladder is unremarkable. Stomach/Bowel: No bowel obstruction or ileus. Normal appendix right lower quadrant. No bowel wall thickening or inflammatory change. Vascular/Lymphatic: No significant vascular findings are present. Stable retroperitoneal lymphadenopathy, largest lymph node in the left para-aortic region measuring up to 21 x 11 mm. Stable borderline enlarged bilateral inguinal lymph nodes. Reproductive: Uterus and bilateral adnexa are unremarkable. Other: There is a small amount of ascites within the lower pelvis, increased in volume since prior study. No free intraperitoneal gas. No abdominal wall hernia. Musculoskeletal: No acute or destructive bony lesions. Reconstructed images demonstrate no additional findings. IMPRESSION: 1. Progressive gallbladder wall thickening and pericholecystic fluid, with development of periportal edema. Findings could reflect acalculous cholecystitis, progressive hepatic dysfunction, or hepatitis. 2. Splenomegaly and retroperitoneal lymphadenopathy, not appreciably changed. Findings could be reactive and related to underlying infection or due to lymphoproliferative disorder. 3. Small volume ascites within the lower pelvis, increased in volume since prior study. Electronically Signed   By: Randa Ngo M.D.   On: 01/11/2021 21:43   CT ABDOMEN PELVIS W CONTRAST  Result Date: 01/07/2021 CLINICAL DATA:  20 year old with abdominal pain. Suspect pyelonephritis. EXAM: CT ABDOMEN AND PELVIS WITH CONTRAST  TECHNIQUE: Multidetector CT imaging of the abdomen and pelvis was performed using the standard protocol following bolus administration of intravenous contrast. CONTRAST:  122mL OMNIPAQUE IOHEXOL 300 MG/ML  SOLN COMPARISON:  CT renal stone protocol 01/04/2021 and pelvic ultrasound 01/04/2021 FINDINGS: Lower chest: Lung bases are clear.  No pleural effusions. Hepatobiliary: Normal appearance of the liver, gallbladder and portal venous system. No biliary dilatation Pancreas: Unremarkable. No pancreatic ductal dilatation or surrounding inflammatory changes. Spleen: Spleen is prominent for size measuring 12.7 cm in the AP dimension. Adrenals/Urinary Tract: Normal adrenal glands. Normal appearance of both kidneys without hydronephrosis or perinephric edema. Normal appearance of the urinary bladder. Stomach/Bowel: Stomach is within normal limits. Appendix appears normal. No evidence of bowel wall thickening, distention, or inflammatory changes. Vascular/Lymphatic: Normal appearance of the abdominal aorta. Normal appearance of the venous structures. There is prominent periaortic soft tissue particularly along the left side of the aorta. Soft tissue measures up to 1.8 cm on sequence 2 image 35. There was a similar finding on the  recent comparison CT. Findings are compatible with enlarged lymph nodes in this area. Small lymph nodes along the anterior aspect of the distal IVC. No significant pelvic lymph node enlargement. Symmetric lymph nodes along the pelvic sidewall measure 0.7 cm on sequence 2, image 68. Reproductive: 2.0 cm low-density structure in the left adnexa could represent a prominent follicle and compatible with the recent ultrasound findings. Normal appearance of the uterus. Other: Small amount of free fluid in the pelvis. Musculoskeletal: No acute bone abnormality. IMPRESSION: 1. No acute abnormality in the abdomen or pelvis. Specifically, no evidence for hydronephrosis or pyelonephritis. 2. Slightly prominent  retroperitoneal lymph nodes particularly along the left side of the aorta. Spleen is also prominent for size. Findings are nonspecific but could be related to a lymphoproliferative process or infectious etiology such as mononucleosis. 3. Small amount of free fluid in the pelvis is likely physiologic. Electronically Signed   By: Markus Daft M.D.   On: 01/07/2021 09:56   NM Hepato W/EF  Result Date: 01/11/2021 CLINICAL DATA:  Abdominal pain EXAM: NUCLEAR MEDICINE HEPATOBILIARY IMAGING WITH GALLBLADDER EF TECHNIQUE: Sequential images of the abdomen were obtained out to 60 minutes following intravenous administration of radiopharmaceutical. After oral ingestion of Ensure, gallbladder ejection fraction was determined. At 60 min, normal ejection fraction is greater than 33%. RADIOPHARMACEUTICALS:  5.28 mCi Tc-68m  Choletec IV COMPARISON:  Ultrasound Jan 10, 2021 FINDINGS: There is slightly delayed but uniform radiotracer uptake by the liver with delayed hepatic radiotracer clearance. Normal filling of the intrahepatic ducts, common bile duct. Gallbladder activity is visualized, consistent with patency of cystic duct (normal < 60 minutes). Additionally there is normal biliary to bowel transit (normal < 60 minutes), consistent with patent common bile duct. Ensure was administered and the gallbladder appears to empty normally on sequential images. Calculated gallbladder ejection fraction is 29%. (Normal gallbladder ejection fraction with Ensure is greater than 33%.) No evidence of enterogastric biliary reflux. IMPRESSION: Slightly delayed hepatic radiotracer uptake with delayed hepatic clearance of radiotracer, suggestive of hepatocellular dysfunction/hepatitis. In the setting of hepatocellular dysfunction the evaluation of cholecystitis by HIDA is somewhat limited. Within this context there is appropriate filling of the gallbladder before 60 minutes indicating cystic duct patency excluding acute cholecystitis. However,  there is a slightly reduced calculated gallbladder ejection fraction, which is at least in part artifactual given the persistent background hepatic counts projecting in the gallbladder fossa and delayed excretion of radiotracer into the biliary system, as such this study is equivocal in evaluation for chronic cholecystitis/biliary dyskinesia. Electronically Signed   By: Dahlia Bailiff MD   On: 01/11/2021 15:47   US Abdomen Limited  Result Date: 01/09/2021 CLINICAL DATA:  Left upper quadrant pain EXAM: ULTRASOUND SPLEEN COMPARISON:  None. FINDINGS: Spleen measures 13.3 x 8.1 x 11.9 cm with a measures splenic volume of 670 cubic cm. No focal splenic lesions are evident. No perisplenic fluid or adenopathy. IMPRESSION: Prominent spleen without focal splenic lesion evident. Electronically Signed   By: Lowella Grip III M.D.   On: 01/09/2021 07:56   [place] Art/Ven Flow Abd Pelv Doppler  Result Date: 01/04/2021 CLINICAL DATA:  Initial evaluation for acute left pelvic pain, evaluate for torsion. EXAM: TRANSABDOMINAL ULTRASOUND OF PELVIS DOPPLER ULTRASOUND OF OVARIES TECHNIQUE: Transabdominal ultrasound examination of the pelvis was performed including evaluation of the uterus, ovaries, adnexal regions, and pelvic cul-de-sac. Color and duplex Doppler ultrasound was utilized to evaluate blood flow to the ovaries. COMPARISON:  Prior CT from earlier the same day. FINDINGS: Uterus  Measurements: 6.1 x 2.9 x 4.0 cm = volume: 37.7 mL. Uterus is retroverted. No discrete fibroid or other mass. Endometrium Thickness: 5.0 mm.  No focal abnormality visualized. Right ovary Measurements: 3.1 x 1.5 x 2.2 cm = volume: 5.4 mL. Normal appearance/no adnexal mass. Left ovary Measurements: 3.4 x 2.2 x 2.7 cm = volume: 11.3 mL. Normal appearance/no adnexal mass. 1.8 cm dominant follicle noted. Pulsed Doppler evaluation demonstrates normal low-resistance arterial and venous waveforms in both ovaries. Other: Trace free fluid within the  pelvis, likely physiologic. IMPRESSION: 1. 1.8 cm left ovarian dominant follicle, which could contribute to left-sided pelvic pain. Associated trace free fluid within the pelvis. 2. Otherwise unremarkable and normal pelvic ultrasound. No evidence for ovarian torsion. Electronically Signed   By: Jeannine Boga M.D.   On: 01/04/2021 19:58   DG CHEST PORT 1 VIEW  Result Date: 01/16/2021 CLINICAL DATA:  IBS and anxiety left flank pain EXAM: PORTABLE CHEST 1 VIEW COMPARISON:  01/13/2021 FINDINGS: The heart size and mediastinal contours are within normal limits. Both lungs are clear. The visualized skeletal structures are unremarkable. IMPRESSION: No active disease. Electronically Signed   By: Donavan Foil M.D.   On: 01/16/2021 19:44   DG CHEST PORT 1 VIEW  Result Date: 01/13/2021 CLINICAL DATA:  20 y/o female inpatient with chest pain that started on April 30th per patient Pt denies being a smoker Pt denies SOB Pt states pain in midline upper chest Hx of Asthma EXAM: PORTABLE CHEST 1 VIEW COMPARISON:  None. FINDINGS: Normal mediastinum and cardiac silhouette. Normal pulmonary vasculature. No evidence of effusion, infiltrate, or pneumothorax. No acute bony abnormality. IMPRESSION: Normal chest radiograph Electronically Signed   By: Suzy Bouchard M.D.   On: 01/13/2021 14:33   ECHOCARDIOGRAM COMPLETE  Result Date: 01/10/2021    ECHOCARDIOGRAM REPORT   Patient Name:   [facility] Date of Exam: 01/10/2021 Medical Rec #:  [id number]     Height:       62.0 in Accession #:    [id number]    Weight:       135.0 lb Date of Birth:  2000/10/15     BSA:          1.618 m Patient Age:    20 years      BP:           104/84 mmHg Patient Gender: F             HR:           110 bpm. Exam Location:  Inpatient Procedure: 2D Echo, Cardiac Doppler and Color Doppler Indications:    Fever R50.9  History:        Patient has no prior history of Echocardiogram examinations.  Sonographer:    Jonelle Sidle Dance Referring Phys: [id number]  CORNELIUS N VAN DAM  Sonographer Comments: [place] Cardiologist notified. IMPRESSIONS  1. Left ventricular ejection fraction, by estimation, is 60 to 65%. The left ventricle has normal function. The left ventricle has no regional wall motion abnormalities. Left ventricular diastolic parameters were normal.  2. Right ventricular systolic function is normal. The right ventricular size is normal.  3. The mitral valve is normal in structure. No evidence of mitral valve regurgitation. No evidence of mitral stenosis.  4. Possible vegetation on tricuspid valve, 1.2 x 0.4cm mobile, septal leaflet. Consider TEE for further clarification. The tricuspid valve is abnormal.  5. The aortic valve is normal in structure. Aortic valve regurgitation is not visualized. No aortic stenosis is present.  6. The inferior vena cava is normal in size with greater than 50% respiratory variability, suggesting right atrial pressure of 3 mmHg. FINDINGS  Left Ventricle: Left ventricular ejection fraction, by estimation, is 60 to 65%. The left ventricle has normal function. The left ventricle has no regional wall motion abnormalities. The left ventricular internal cavity size was normal in size. There is  no left ventricular hypertrophy. Left ventricular diastolic parameters were normal. Right Ventricle: The right ventricular size is normal. No increase in right ventricular wall thickness. Right ventricular systolic function is normal. Left Atrium: Left atrial size was normal in size. Right Atrium: Right atrial size was normal in size. Pericardium: There is no evidence of pericardial effusion. Mitral Valve: The mitral valve is normal in structure. No evidence of mitral valve regurgitation. No evidence of mitral valve stenosis. Tricuspid Valve: Possible vegetation on tricuspid valve, 1.2 x 0.4cm mobile, septal leaflet. Consider TEE for further clarification. The tricuspid valve is abnormal. Tricuspid valve regurgitation is not demonstrated. No  evidence of tricuspid stenosis. Aortic Valve: The aortic valve is normal in structure. Aortic valve regurgitation is not visualized. No aortic stenosis is present. Pulmonic Valve: The pulmonic valve was normal in structure. Pulmonic valve regurgitation is not visualized. No evidence of pulmonic stenosis. Aorta: The aortic root is normal in size and structure. Venous: The inferior vena cava is normal in size with greater than 50% respiratory variability, suggesting right atrial pressure of 3 mmHg. IAS/Shunts: No atrial level shunt detected by color flow Doppler.  LEFT VENTRICLE PLAX 2D LVIDd:         3.60 cm  Diastology LVIDs:         2.70 cm  LV e' medial:    12.00 cm/s LV PW:         1.00 cm  LV E/e' medial:  8.8 LV IVS:        0.70 cm  LV e' lateral:   19.10 cm/s LVOT diam:     1.70 cm  LV E/e' lateral: 5.5 LV SV:         35 LV SV Index:   22 LVOT Area:     2.27 cm  RIGHT VENTRICLE             IVC RV Basal diam:  2.20 cm     IVC diam: 1.80 cm RV S prime:     12.30 cm/s TAPSE (M-mode): 2.5 cm LEFT ATRIUM             Index       RIGHT ATRIUM          Index LA diam:        3.10 cm 1.92 cm/m  RA Area:     8.27 cm LA Vol (A2C):   38.9 ml 24.05 ml/m RA Volume:   15.70 ml 9.71 ml/m LA Vol (A4C):   22.7 ml 14.03 ml/m LA Biplane Vol: 29.6 ml 18.30 ml/m  AORTIC VALVE LVOT Vmax:   109.00 cm/s LVOT Vmean:  66.600 cm/s LVOT VTI:    0.154 m  AORTA Ao Root diam: 2.40 cm Ao Asc diam:  2.40 cm MITRAL VALVE MV Area (PHT): 3.85 cm     SHUNTS MV Decel Time: 197 msec     Systemic VTI:  0.15 m MV E velocity: 106.00 cm/s  Systemic Diam: 1.70 cm MV A velocity: 76.40 cm/s MV E/A ratio:  1.39 Candee Furbish MD Electronically signed by Candee Furbish MD Signature Date/Time: 01/10/2021/10:32:01 AM    Final  CT Renal Stone Study  Result Date: 01/04/2021 CLINICAL DATA:  Two days of left-sided flank pain, kidney stone suspected EXAM: CT ABDOMEN AND PELVIS WITHOUT CONTRAST TECHNIQUE: Multidetector CT imaging of the abdomen and pelvis was  performed following the standard protocol without IV contrast. COMPARISON:  None. FINDINGS: Lower chest: No acute abnormality. Normal size heart. No significant pericardial effusion/thickening. Hepatobiliary: Unremarkable noncontrast appearance of the hepatic parenchyma. Gallbladder is grossly unremarkable. No biliary ductal dilation. Pancreas: Within normal limits. Spleen: Within normal limits Adrenals/Urinary Tract: Adrenal glands are unremarkable. No renal, ureteral or bladder calculus visualized. Kidneys are normal, contour deforming lesion, or hydronephrosis. Bladder is unremarkable for degree of distension. Stomach/Bowel: Stomach is within normal limits. Appendix is not discretely visualized however there is no pericecal inflammation. No evidence of bowel wall thickening, distention, or inflammatory changes. Vascular/Lymphatic: No significant vascular findings are present. No enlarged abdominal or pelvic lymph nodes. Reproductive: Uterus and right adnexa are unremarkable. 2.3 cm left adnexal cyst. No follow-up imaging recommended. Note: This recommendation does not apply to premenarchal patients and to those with increased risk (genetic, family history, elevated tumor markers or other high-risk factors) of ovarian cancer. Reference: JACR 2020 Feb; 17(2):248-254 Other: Trace pelvic free fluid, likely physiologic. Musculoskeletal: No acute or significant osseous findings. IMPRESSION: No acute abdominopelvic findings. Specifically, no evidence of obstructive uropathy. Electronically Signed   By: Dahlia Bailiff MD   On: 01/04/2021 17:29   ECHO TEE  Result Date: 01/12/2021    TRANSESOPHOGEAL ECHO REPORT   Patient Name:   [facility] Date of Exam: 01/12/2021 Medical Rec #:  [id number]     Height:       62.0 in Accession #:    [id number]    Weight:       135.0 lb Date of Birth:  Jun 07, 2001     BSA:          1.618 m Patient Age:    20 years      BP:           119/81 mmHg Patient Gender: F             HR:            126 bpm. Exam Location:  Inpatient Procedure: Transesophageal Echo and Color Doppler Indications:     Bacteremia  History:         Patient has no prior history of Echocardiogram examinations,                  most recent 01/10/2021.  Sonographer:     Clayton Lefort RDCS (AE) Referring Phys:  [id number] Leanor Kail Diagnosing Phys: Candee Furbish MD PROCEDURE: After discussion of the risks and benefits of a TEE, an informed consent was obtained from the patient. The transesophogeal probe was passed without difficulty through the esophogus of the patient. Sedation performed by performing physician. Patients was under conscious sedation during this procedure. Anesthetic administered: 73mcg of Fentanyl, 4.$RemoveBefore'0mg'[id number]$  of Versed. Image quality was good. The patient's vital signs; including heart rate, blood pressure, and oxygen saturation; remained stable throughout the procedure. The patient developed no complications during the procedure. IMPRESSIONS  1. Left ventricular ejection fraction, by estimation, is 60 to 65%. The left ventricle has normal function. The left ventricle has no regional wall motion abnormalities.  2. Right ventricular systolic function is normal. The right ventricular size is normal.  3. No left atrial/left atrial appendage thrombus was detected.  4. The mitral valve is normal in structure. No evidence  of mitral valve regurgitation. No evidence of mitral stenosis.  5. Normal tricuspid valve. No evidence of vegetation.  6. The aortic valve is normal in structure. Aortic valve regurgitation is not visualized. No aortic stenosis is present.  7. The inferior vena cava is normal in size with greater than 50% respiratory variability, suggesting right atrial pressure of 3 mmHg. Conclusion(s)/Recommendation(s): Normal biventricular function without evidence of hemodynamically significant valvular heart disease. No evidence of vegetation/infective endocarditis on this transesophageal echocardiogram. FINDINGS  Left  Ventricle: Left ventricular ejection fraction, by estimation, is 60 to 65%. The left ventricle has normal function. The left ventricle has no regional wall motion abnormalities. The left ventricular internal cavity size was normal in size. There is  no left ventricular hypertrophy. Right Ventricle: The right ventricular size is normal. No increase in right ventricular wall thickness. Right ventricular systolic function is normal. Left Atrium: Left atrial size was normal in size. No left atrial/left atrial appendage thrombus was detected. Right Atrium: Right atrial size was normal in size. Pericardium: There is no evidence of pericardial effusion. Mitral Valve: The mitral valve is normal in structure. No evidence of mitral valve regurgitation. No evidence of mitral valve stenosis. Tricuspid Valve: Normal tricuspid valve. No evidence of vegetation. The tricuspid valve is normal in structure. Tricuspid valve regurgitation is not demonstrated. No evidence of tricuspid stenosis. Aortic Valve: The aortic valve is normal in structure. Aortic valve regurgitation is not visualized. No aortic stenosis is present. Pulmonic Valve: The pulmonic valve was normal in structure. Pulmonic valve regurgitation is not visualized. No evidence of pulmonic stenosis. Aorta: The aortic root is normal in size and structure. Venous: The inferior vena cava is normal in size with greater than 50% respiratory variability, suggesting right atrial pressure of 3 mmHg. IAS/Shunts: No atrial level shunt detected by color flow Doppler. Candee Furbish MD Electronically signed by Candee Furbish MD Signature Date/Time: 01/12/2021/3:52:02 PM    Final    US Abdomen Limited RUQ (LIVER/GB)  Result Date: 01/10/2021 CLINICAL DATA:  Elevated liver enzymes with epigastric region pain EXAM: ULTRASOUND ABDOMEN LIMITED RIGHT UPPER QUADRANT COMPARISON:  CT abdomen and pelvis January 07, 2021 FINDINGS: Gallbladder: No evident gallstones. Gallbladder wall appears thickened.  No pericholecystic fluid no sonographic Murphy sign noted by sonographer. Common bile duct: Diameter: 5 mm. No intrahepatic or extrahepatic biliary duct dilatation. Liver: No focal lesion identified. Within normal limits in parenchymal echogenicity. Portal vein is patent on color Doppler imaging with normal direction of blood flow towards the liver. Other: None. IMPRESSION: Thickening of the gallbladder wall noted. No gallstones evident. Question a degree of acalculus cholecystitis. This finding may warrant nuclear medicine hepatobiliary imaging study to assess cystic duct patency. Study otherwise unremarkable. Electronically Signed   By: Lowella Grip III M.D.   On: 01/10/2021 14:10    Labs:  CBC: Recent Labs    01/15/21 0746 01/16/21 0514 01/17/21 0532 01/18/21 0512  WBC 23.7* 23.2* 18.3* 14.8*  HGB 11.6* 11.8* 12.0 12.3  HCT 34.5* 35.6* 35.7* 36.8  PLT 416* 406* 448* 474*    COAGS: Recent Labs    01/12/21 0452  INR 1.2    BMP: Recent Labs    01/15/21 0746 01/16/21 0514 01/17/21 0532 01/18/21 0512  NA 137 137 136 135  K 3.7 4.6 4.0 4.7  CL 102 104 102 102  CO2 $Re'27 26 26 25  'iUR$ GLUCOSE 145* 163* 156* 159*  BUN $Re'14 15 15 16  'PBU$ CALCIUM 8.6* 8.7* 8.6* 8.6*  CREATININE 0.45 0.49 <  0.30* 0.49  GFRNONAA >60 >60 NOT CALCULATED >60    LIVER FUNCTION TESTS: Recent Labs    01/15/21 0746 01/16/21 0514 01/17/21 0532 01/18/21 0512  BILITOT 0.4 0.2* 0.3 0.3  AST 41 $Remo'25 19 23  'noGVF$ ALT 168* 128* 101* 90*  ALKPHOS 123 98 85 74  PROT 7.1 6.4* 6.3* 6.5  ALBUMIN 3.6 3.5 3.3* 3.4*    TUMOR MARKERS: No results for input(s): AFPTM, CEA, CA199, CHROMGRNA in the last 8760 hours.  Assessment and Plan: 20 y.o. female with splenomegaly and abnormal blood smear in the setting of recent diagnosis of DRESS syndrome.  IR was requested by oncology for a bone marrow biopsy and aspiration on 01/19/21 for further evaluation of possible lymphoproliferative disorder.  The procedure is tentatively  scheduled for tomorrow 01/19/2021 pending IR schedule. WBC 14.8, Hgb 12.3, platelet 474 VSS, afebrile  Made n.p.o. at midnight CBC with differential ordered On Lovenox 40 mg sq daily at 2 PM, no need for hold for bone marrow biopsy.  Risks and benefits of bone marrow biopsy and aspiration was discussed with the patient and/or patient's family including, but not limited to bleeding, infection, damage to adjacent structures or low yield requiring additional tests.  All of the questions were answered and there is agreement to proceed.  Consent signed and in chart.   Thank you for this interesting consult.  I greatly enjoyed meeting Alexandra Young and look forward to participating in their care.  A copy of this report was sent to the requesting provider on this date.  Electronically Signed: Tera Mater, PA-C 01/18/2021, 8:53 AM   I spent a total of 20 Minutes    in face to face in clinical consultation, greater than 50% of which was counseling/coordinating care for bone marrow biopsy and aspiration

## 2021-01-18 NOTE — Progress Notes (Signed)
[facility] PROGRESS NOTE    Morrison OldMoriah Pontarelli  JXB:[id number] DOB: February 13, 2001 DOA: 01/07/2021 PCP: Aretha Parrotamsey, Georgina, NP      Brief Narrative:  Ms Alexandra Young is a 20 y.o. F with hx asthma, mood disorder who presented with abdominal pain, fever symptoms progressively worsening over weeks.  Found to have transaminitis, eosinophilia, lymphadenopathy, new maculopapular rash.  Noted to have started Lamictal 6 weeks prior.      Assessment & Plan:  DRESS Initially there was some diagnostic uncertainty, ID and Heme were consulted.  She had an extensive work up for infection and/or hematologic malignancy which has so far been unremarkable.  Please see more extensive summary of work up by Dr. Lowell GuitarPowell from 5/10.    Lamictal stopped 5/4, LFTs subsequently resolving, AST now normal, ALT near normal from peak >200.  Cr normal.  Chest imaging shows no lung involvement.   Eos resolved.  Started steroids 5/6  Patient feels well, no fever.  D[facility] Shawn [facility]ollingsworth[facility].  They will arrange close outpatietn follow up and provide second opinion.  They recommended a prednisone dose and triamcinolone dose, see below.  They will address thyroid monitoring, repeat LFTs.  -Start prednisone 60 mg daily - Triamcin cream 0.1 BID from neck down -Follow up with [facility] derm asap   Mood disorder -Continue buspirone  Asthma/allergies -Continue Singulair, loratadine  BV -Continue Flagyl  Superficial vein thrombosis bilateral arms - Hot compresses as needed    Disposition: Status is: Inpatient  Remains inpatient appropriate because:Inpatient level of care appropriate due to severity of illness   Dispo: The patient is from: Home              Anticipated d/c is to: Home              Patient currently is not medically stable to d/c.   Difficult to place patient No       Level of care: Telemetry       MDM: The below labs and imaging reports were reviewed  and summarized above.  Medication management as above.   DVT prophylaxis: enoxaparin (LOVENOX) injection 40 mg Start: 01/07/21 1400  Code Status: FULL Family Communication: mother by phone          Subjective: No change in rash, no confusion.  No vomiting, headache, chest discomfort, dyspnea.  No abdominal pain.  Objective: Vitals:   01/17/21 1530 01/17/21 2028 01/18/21 0449 01/18/21 1452  BP: 129/83 123/81 119/68 129/77  Pulse: 99 97 83 (!) 110  Resp: (!) 22 18 18 20   Temp: 98.4 F (36.9 C) 98.7 F (37.1 C) 98.9 F (37.2 C) 98 F (36.7 C)  TempSrc: Oral Oral Oral   SpO2: 100% 99% 99% 100%  Weight:      Height:        Intake/Output Summary (Last 24 hours) at 01/18/2021 1616 Last data filed at 01/18/2021 0957 Gross per 24 hour  Intake 946 ml  Output --  Net 946 ml   Filed Weights   01/07/21 0600  Weight: 61.2 kg    Examination: General appearance:  adult female, alert and in no obvious distress.  Ambulating in room HEENT: Anicteric, conjunctiva pink, lids and lashes normal. No nasal deformity, discharge, epistaxis.  Lips moist, dentition normal.  No mucosal sloughing.   Skin: Warm and dry.  There is a diffuse maculopapular rash, on the arms, legs bilaterally, confluent on the trunk, no jaundice.  Mild puffiness of the face  Cardiac: RRR,  nl S1-S2, no murmurs appreciated.  Capillary refill is brisk.  JVP not visible.  No LE edema.  Radial pulses 2+ and symmetric. Respiratory: Normal respiratory rate and rhythm.  CTAB without rales or wheezes. Abdomen: Abdomen soft.  No TTP or guarding. No ascites, distension, hepatosplenomegaly.   MSK: No deformities or effusions. Neuro: Awake and alert.  EOMI, moves all extremities. Speech fluent.    Psych: Sensorium intact and responding to questions, attention normal. Affect normal.  Judgment and insight appear normal.    Data Reviewed: I have personally reviewed following labs and imaging studies:  CBC: Recent Labs   Lab 01/14/21 0452 01/13/21 0418 01/14/21 0539 01/15/21 0746 01/16/21 0514 01/17/21 0532 01/18/21 0512  WBC 13.9* 16.9* 19.2* 23.7* 23.2* 18.3* 14.8*  NEUTROABS 4.2 5.0  --  11.5*  --  11.7* 10.1*  HGB 12.5 11.2* 10.9* 11.6* 11.8* 12.0 12.3  HCT 36.7 33.4* 32.6* 34.5* 35.6* 35.7* 36.8  MCV 84.0 83.9 83.6 84.6 84.4 85.0 86.2  PLT 203 255 317 416* 406* 448* 474*   Basic Metabolic Panel: Recent Labs  Lab 01/13/21 0418 01/14/21 0539 01/15/21 0746 01/16/21 0514 01/17/21 0532 01/18/21 0512  NA 138 143 137 137 136 135  K 3.8 3.9 3.7 4.6 4.0 4.7  CL 104 109 102 104 102 102  CO2 23 24 27 26 26 25   GLUCOSE 116* 147* 145* 163* 156* 159*  BUN 10 12 14 15 15 16   CREATININE 0.56 0.39* 0.45 0.49 <0.30* 0.49  CALCIUM 8.3* 8.6* 8.6* 8.7* 8.6* 8.6*  MG 2.2 2.3  --  2.5* 2.4 2.5*  PHOS 2.6 4.6  --  4.3 4.7* 4.4   GFR: Estimated Creatinine Clearance: 95.7 mL/min (by C-G formula based on SCr of 0.49 mg/dL). Liver Function Tests: Recent Labs  Lab 01/14/21 0539 01/15/21 0746 01/16/21 0514 01/17/21 0532 01/18/21 0512  AST 60* 41 25 19 23   ALT 176* 168* 128* 101* 90*  ALKPHOS 138* 123 98 85 74  BILITOT 0.3 0.4 0.2* 0.3 0.3  PROT 6.7 7.1 6.4* 6.3* 6.5  ALBUMIN 3.3* 3.6 3.5 3.3* 3.4*   Recent Labs  Lab 01/11/21 1850  LIPASE 82*   No results for input(s): AMMONIA in the last 168 hours. Coagulation Profile: Recent Labs  Lab 2021-01-14 0452  INR 1.2   Cardiac Enzymes: No results for input(s): CKTOTAL, CKMB, CKMBINDEX, TROPONINI in the last 168 hours. BNP (last 3 results) No results for input(s): PROBNP in the last 8760 hours. HbA1C: No results for input(s): HGBA1C in the last 72 hours. CBG: Recent Labs  Lab 01/17/21 1118 01/17/21 1650 01/17/21 2138 01/18/21 0735 01/18/21 1202  GLUCAP 237* 194* 163* 122* 161*   Lipid Profile: No results for input(s): CHOL, HDL, LDLCALC, TRIG, CHOLHDL, LDLDIRECT in the last 72 hours. Thyroid Function Tests: Recent Labs     01/18/21 0512  TSH 0.515   Anemia Panel: No results for input(s): VITAMINB12, FOLATE, FERRITIN, TIBC, IRON, RETICCTPCT in the last 72 hours. Urine analysis:    Component Value Date/Time   COLORURINE YELLOW 01/07/2021 0932   APPEARANCEUR CLEAR 01/07/2021 0932   LABSPEC 1.045 (H) 01/07/2021 0932   PHURINE 6.0 01/07/2021 0932   GLUCOSEU NEGATIVE 01/07/2021 0932   HGBUR LARGE (A) 01/07/2021 0932   BILIRUBINUR NEGATIVE 01/07/2021 0932   KETONESUR 80 (A) 01/07/2021 0932   PROTEINUR 30 (A) 01/07/2021 0932   UROBILINOGEN 0.2 11/22/2020 0848   NITRITE NEGATIVE 01/07/2021 0932   LEUKOCYTESUR NEGATIVE 01/07/2021 0932   Sepsis Labs: @LABRCNTIP (procalcitonin:4,lacticacidven:4)  )  No results found for this or any previous visit (from the past 240 hour(s)).       Radiology Studies: CT ANGIO CHEST PE W OR WO CONTRAST  Result Date: 01/16/2021 CLINICAL DATA:  20 year old female with chest pain and shortness of breath. EXAM: CT ANGIOGRAPHY CHEST WITH CONTRAST TECHNIQUE: Multidetector CT imaging of the chest was performed using the standard protocol during bolus administration of intravenous contrast. Multiplanar CT image reconstructions and MIPs were obtained to evaluate the vascular anatomy. CONTRAST:  OMNIPAQUE IOHEXOL 350 MG/ML SOLN COMPARISON:  Chest CT dated 01/09/2021. FINDINGS: Cardiovascular: There is no cardiomegaly or pericardial effusion. The thoracic aorta is unremarkable. No pulmonary artery embolus identified. Mediastinum/Nodes: There is no hilar or mediastinal adenopathy. The esophagus and the thyroid gland are grossly unremarkable. No mediastinal fluid collection. Lungs/Pleura: The lungs are clear. There is no pleural effusion or pneumothorax. The central airways are patent. Upper Abdomen: No acute abnormality. Musculoskeletal: No chest wall abnormality. No acute or significant osseous findings. Review of the MIP images confirms the above findings. IMPRESSION: No acute  intrathoracic pathology. No CT evidence of pulmonary embolism. Electronically Signed   By: Elgie Collard M.D.   On: 01/16/2021 22:20   DG CHEST PORT 1 VIEW  Result Date: 01/16/2021 CLINICAL DATA:  IBS and anxiety left flank pain EXAM: PORTABLE CHEST 1 VIEW COMPARISON:  01/13/2021 FINDINGS: The heart size and mediastinal contours are within normal limits. Both lungs are clear. The visualized skeletal structures are unremarkable. IMPRESSION: No active disease. Electronically Signed   By: Jasmine Pang M.D.   On: 01/16/2021 19:44   VAS [place] UPPER EXTREMITY VENOUS DUPLEX  Result Date: 01/18/2021 UPPER VENOUS STUDY  Patient Name:  Flonnie Wierman  Date of Exam:   01/18/2021 Medical Rec #: [id number]      Accession #:    [id number] Date of Birth: [birth year]      Patient Gender: F Patient Age:   20Y Exam Location:  [facility] Procedure:      VAS [place] UPPER EXTREMITY VENOUS DUPLEX Referring Phys: A CALDWELL POWELL JR --------------------------------------------------------------------------------  Indications: Pain bilaterally s/p IV Limitations: Bandaging/IV RT forearm. Comparison Study: No prior studies. Performing Technologist: Jean Rosenthal RDMS,RVT  Examination Guidelines: A complete evaluation includes B-mode imaging, spectral Doppler, color Doppler, and power Doppler as needed of all accessible portions of each vessel. Bilateral testing is considered an integral part of a complete examination. Limited examinations for reoccurring indications may be performed as noted.  Right Findings: +----------+------------+---------+-----------+----------+-----------------+ RIGHT     CompressiblePhasicitySpontaneousProperties     Summary      +----------+------------+---------+-----------+----------+-----------------+ IJV           Full       Yes       Yes                                +----------+------------+---------+-----------+----------+-----------------+ Subclavian    Full       Yes       Yes                                 +----------+------------+---------+-----------+----------+-----------------+ Axillary      Full       Yes       Yes                                +----------+------------+---------+-----------+----------+-----------------+  Brachial      Full                                                    +----------+------------+---------+-----------+----------+-----------------+ Radial        Full                                                    +----------+------------+---------+-----------+----------+-----------------+ Ulnar         Full                                                    +----------+------------+---------+-----------+----------+-----------------+ Cephalic    Partial      Yes       Yes              Age Indeterminate +----------+------------+---------+-----------+----------+-----------------+ Basilic       Full                                                    +----------+------------+---------+-----------+----------+-----------------+  Left Findings: +----------+------------+---------+-----------+----------+-----------------+ LEFT      CompressiblePhasicitySpontaneousProperties     Summary      +----------+------------+---------+-----------+----------+-----------------+ IJV           Full       Yes       Yes                                +----------+------------+---------+-----------+----------+-----------------+ Subclavian    Full       Yes       Yes                                +----------+------------+---------+-----------+----------+-----------------+ Axillary      Full       Yes       Yes                                +----------+------------+---------+-----------+----------+-----------------+ Brachial      Full                                                    +----------+------------+---------+-----------+----------+-----------------+ Radial        Full                                                     +----------+------------+---------+-----------+----------+-----------------+ Ulnar         Full                                                    +----------+------------+---------+-----------+----------+-----------------+  Cephalic      None       No        No               Age Indeterminate +----------+------------+---------+-----------+----------+-----------------+ Basilic       Full                                                    +----------+------------+---------+-----------+----------+-----------------+  Summary:  Right: No evidence of deep vein thrombosis in the upper extremity. 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  Left: No evidence of deep vein thrombosis in the upper extremity. Findings consistent with age indeterminate superficial vein thrombosis involving the left cephalic vein.  *See table(s) above for measurements and observations.    Preliminary         Scheduled Meds: . busPIRone  10 mg Oral BID  . enoxaparin (LOVENOX) injection  40 mg Subcutaneous Q24H  . loratadine  10 mg Oral Daily  . metroNIDAZOLE  500 mg Oral BID  . montelukast  10 mg Oral QHS  . nystatin  5 mL Oral QID  . pantoprazole  40 mg Oral Daily  . [START ON 01/19/2021] predniSONE  60 mg Oral Q breakfast  . sodium chloride flush  10-40 mL Intracatheter Q12H  . triamcinolone 0.1 % cream : eucerin  1 application Topical BID   Continuous Infusions:   LOS: 9 days    Time spent: 35 minutes    Alberteen Sam, MD [facility] 01/18/2021, 4:16 PM     Please page though AMION or Epic secure chat:  For [employer], Higher education careers adviser

## 2021-01-19 ENCOUNTER — Inpatient Hospital Stay (HOSPITAL_COMMUNITY): Payer: Medicaid Other

## 2021-01-19 DIAGNOSIS — D7212 Drug rash with eosinophilia and systemic symptoms syndrome: Secondary | ICD-10-CM | POA: Diagnosis not present

## 2021-01-19 DIAGNOSIS — T50905A Adverse effect of unspecified drugs, medicaments and biological substances, initial encounter: Secondary | ICD-10-CM | POA: Diagnosis not present

## 2021-01-19 LAB — GLUCOSE, CAPILLARY
Glucose-Capillary: 95 mg/dL (ref 70–99)
Glucose-Capillary: 81 mg/dL (ref 70–99)
Glucose-Capillary: 136 mg/dL — ABNORMAL HIGH (ref 70–99)
Glucose-Capillary: 182 mg/dL — ABNORMAL HIGH (ref 70–99)

## 2021-01-19 LAB — COMPREHENSIVE METABOLIC PANEL
ALT: 83 U/L — ABNORMAL HIGH (ref 0–44)
AST: 29 U/L (ref 15–41)
Albumin: 3.2 g/dL — ABNORMAL LOW (ref 3.5–5.0)
Alkaline Phosphatase: 67 U/L (ref 38–126)
Anion gap: 7 (ref 5–15)
BUN: 19 mg/dL (ref 6–20)
CO2: 24 mmol/L (ref 22–32)
Calcium: 8.3 mg/dL — ABNORMAL LOW (ref 8.9–10.3)
Chloride: 102 mmol/L (ref 98–111)
Creatinine, Ser: 0.5 mg/dL (ref 0.44–1.00)
GFR, Estimated: >60 mL/min (ref >60)
Glucose, Bld: 117 mg/dL — ABNORMAL HIGH (ref 70–99)
Potassium: 4.5 mmol/L (ref 3.5–5.1)
Sodium: 133 mmol/L — ABNORMAL LOW (ref 135–145)
Total Bilirubin: 0.6 mg/dL (ref 0.3–1.2)
Total Protein: 6.2 g/dL — ABNORMAL LOW (ref 6.5–8.1)

## 2021-01-19 LAB — CBC WITH DIFFERENTIAL/PLATELET
Abs Immature Granulocytes: 0.49 10*3/uL — ABNORMAL HIGH (ref 0.00–0.07)
Basophils Absolute: 0.1 10*3/uL (ref 0.0–0.1)
Basophils Relative: 1 %
Eosinophils Absolute: 0.4 10*3/uL (ref 0.0–0.5)
Eosinophils Relative: 3 %
HCT: 36.2 % (ref 36.0–46.0)
Hemoglobin: 11.9 g/dL — ABNORMAL LOW (ref 12.0–15.0)
Immature Granulocytes: 4 %
Lymphocytes Relative: 22 %
Lymphs Abs: 2.8 10*3/uL (ref 0.7–4.0)
MCH: 28.6 pg (ref 26.0–34.0)
MCHC: 32.9 g/dL (ref 30.0–36.0)
MCV: 87 fL (ref 80.0–100.0)
Monocytes Absolute: 1.2 10*3/uL — ABNORMAL HIGH (ref 0.1–1.0)
Monocytes Relative: 9 %
Neutro Abs: 7.8 10*3/uL — ABNORMAL HIGH (ref 1.7–7.7)
Neutrophils Relative %: 61 %
Platelets: 441 10*3/uL — ABNORMAL HIGH (ref 150–400)
RBC: 4.16 MIL/uL (ref 3.87–5.11)
RDW: 14.8 % (ref 11.5–15.5)
WBC: 12.7 10*3/uL — ABNORMAL HIGH (ref 4.0–10.5)
nRBC: 0 % (ref 0.0–0.2)

## 2021-01-19 MED ORDER — FENTANYL CITRATE (PF) 100 MCG/2ML IJ SOLN
INTRAMUSCULAR | Status: AC | PRN
  Administered 2021-01-19 (×2): 50 ug via INTRAVENOUS

## 2021-01-19 MED ORDER — FENTANYL CITRATE (PF) 100 MCG/2ML IJ SOLN
INTRAMUSCULAR | Status: AC
  Filled 2021-01-19: qty 4

## 2021-01-19 MED ORDER — MIDAZOLAM HCL 2 MG/2ML IJ SOLN
INTRAMUSCULAR | Status: AC | PRN
  Administered 2021-01-19: 1 mg via INTRAVENOUS
  Administered 2021-01-19: 2 mg via INTRAVENOUS
  Administered 2021-01-19: 1 mg via INTRAVENOUS

## 2021-01-19 MED ORDER — LIDOCAINE-EPINEPHRINE 1 %-1:100000 IJ SOLN
INTRAMUSCULAR | Status: AC | PRN
  Administered 2021-01-19: 10 mL

## 2021-01-19 MED ORDER — DIPHENHYDRAMINE HCL 50 MG/ML IJ SOLN
INTRAMUSCULAR | Status: AC
  Filled 2021-01-19: qty 1

## 2021-01-19 MED ORDER — DIPHENHYDRAMINE HCL 50 MG/ML IJ SOLN
INTRAMUSCULAR | Status: AC | PRN
  Administered 2021-01-19: 25 mg via INTRAVENOUS

## 2021-01-19 MED ORDER — ENOXAPARIN SODIUM 40 MG/0.4ML IJ SOSY
40.0000 mg | PREFILLED_SYRINGE | INTRAMUSCULAR | Status: DC
  Administered 2021-01-20: 40 mg via SUBCUTANEOUS
  Filled 2021-01-19: qty 0.4

## 2021-01-19 MED ORDER — MIDAZOLAM HCL 2 MG/2ML IJ SOLN
INTRAMUSCULAR | Status: AC
  Filled 2021-01-19: qty 6

## 2021-01-19 NOTE — Progress Notes (Signed)
[facility] PROGRESS NOTE    Alexandra Young  BSW:[id number] DOB: [birth year] DOA: 01/07/2021 PCP: Lynetta Mare, NP      Brief Narrative:  Ms Monjaraz is a 20 y.o. F with hx asthma, mood disorder who presented with abdominal pain, fever symptoms progressively worsening over weeks.  Found to have transaminitis, eosinophilia, lymphadenopathy, new maculopapular rash.  Noted to have started Lamictal 6 weeks prior.      Assessment & Plan:  DRESS No change -Continue Prednisone -Continue triamcinolone crea 0.1 -Follow up with [facility] dermatology   Mood disorder -Stop Lamictal -Continue Buspar    Ashtma, allergies -Continue montelukast and Claritin  BV Completed course of Flagyl  Superficial vein thrombosis bilateral arms - Hot compresses as needed  Burning in toes, fingers Possibly from SV thrombosis, possibly from swelling from prednisone, no medicine she is on because of peripheral neuropathy, hopefully this is reversible.      Disposition: Status is: Inpatient  Remains inpatient appropriate because:Inpatient level of care appropriate due to severity of illness   Dispo: The patient is from: Home              Anticipated d/c is to: Home              Patient currently is not medically stable to d/c.   Difficult to place patient No       Level of care: Telemetry       MDM: The below labs and imaging reports were reviewed and summarized above.  Medication management as above.   DVT prophylaxis: enoxaparin (LOVENOX) injection 40 mg Start: 01/20/21 1000  Code Status: FULL Family Communication: mother by phone          Subjective: No new fever, confusion, respiratory distress.  She has some tingling in her fingers, reported burning in her toes to mother.  Rash is unchanged.  No respiratory distress or cough.  No abdominal pain.       Objective: Vitals:   01/19/21 1225 01/19/21 1255 01/19/21 1310 01/19/21 1501  BP: 106/64  104/67 101/61 110/71  Pulse: 93 96 85 (!) 106  Resp: 16   19  Temp:   98.6 F (37 C) 97.6 F (36.4 C)  TempSrc:   Oral Oral  SpO2: 99% 100% 100% 99%  Weight:      Height:        Intake/Output Summary (Last 24 hours) at 01/19/2021 1635 Last data filed at 01/18/2021 1842 Gross per 24 hour  Intake 827 ml  Output --  Net 827 ml   Filed Weights   01/07/21 0600  Weight: 61.2 kg    Examination: General appearance: Adult female, sitting in recliner, no acute distress     HEENT:    Skin: Diffuse maculopapular rash, covering the trunk, no jaundice, puffiness of face, no change in rash from yesterday, no swelling of the cephalic vein bilaterally in the arms Cardiac Respiratory:  Abdomen:   MSK:  Neuro: Awake and alert, gait normal, moves all extremities with normal strength and coordination, speech fluent Psych: Awake and alert, extraocular movements intact, sensorium intact and responding to questions, attention normal, affect normal, judgment insight normal     Data Reviewed: I have personally reviewed following labs and imaging studies:  CBC: Recent Labs  Lab 01/13/21 0418 01/14/21 0539 01/15/21 0746 01/16/21 0514 01/17/21 0532 01/18/21 0512 01/19/21 0508  WBC 16.9*   < > 23.7* 23.2* 18.3* 14.8* 12.7*  NEUTROABS 5.0  --  11.5*  --  11.7* 10.1* 7.8*  HGB 11.2*   < > 11.6* 11.8* 12.0 12.3 11.9*  HCT 33.4*   < > 34.5* 35.6* 35.7* 36.8 36.2  MCV 83.9   < > 84.6 84.4 85.0 86.2 87.0  PLT 255   < > 416* 406* 448* 474* 441*   < > = values in this interval not displayed.   Basic Metabolic Panel: Recent Labs  Lab 01/13/21 0418 01/14/21 0539 01/15/21 0746 01/16/21 0514 01/17/21 0532 01/18/21 0512 Feb 08, 2021 0508  NA 138 143 137 137 136 135 133*  K 3.8 3.9 3.7 4.6 4.0 4.7 4.5  CL 104 109 102 104 102 102 102  CO2 _0 GLUCOSE 116* 147* 145* 163* 156* 159* 117*  BUN _1 CREATININE 0.56 0.39* 0.45 0.49 <0.30* 0.49 0.50  CALCIUM 8.3*  8.6* 8.6* 8.7* 8.6* 8.6* 8.3*  MG 2.2 2.3  --  2.5* 2.4 2.5*  --   PHOS 2.6 4.6  --  4.3 4.7* 4.4  --    GFR: Estimated Creatinine Clearance: 95.7 mL/min (by C-G formula based on SCr of 0.5 mg/dL). Liver Function Tests: Recent Labs  Lab 01/15/21 0746 01/16/21 0514 01/17/21 0532 01/18/21 0512 02-08-2021 0508  AST 41 _2 ALT 168* 128* 101* 90* 83*  ALKPHOS 123 98 85 74 67  BILITOT 0.4 0.2* 0.3 0.3 0.6  PROT 7.1 6.4* 6.3* 6.5 6.2*  ALBUMIN 3.6 3.5 3.3* 3.4* 3.2*   No results for input(s): LIPASE, AMYLASE in the last 168 hours. No results for input(s): AMMONIA in the last 168 hours. Coagulation Profile: No results for input(s): INR, PROTIME in the last 168 hours. Cardiac Enzymes: No results for input(s): CKTOTAL, CKMB, CKMBINDEX, TROPONINI in the last 168 hours. BNP (last 3 results) No results for input(s): PROBNP in the last 8760 hours. HbA1C: No results for input(s): HGBA1C in the last 72 hours. CBG: Recent Labs  Lab 01/18/21 1657 01/18/21 2113 02-08-2021 0759 02-08-21 1215 02-08-21 1622  GLUCAP 133* 280* 95 81 136*   Lipid Profile: No results for input(s): CHOL, HDL, LDLCALC, TRIG, CHOLHDL, LDLDIRECT in the last 72 hours. Thyroid Function Tests: Recent Labs    01/18/21 0512  TSH 0.515   Anemia Panel: No results for input(s): VITAMINB12, FOLATE, FERRITIN, TIBC, IRON, RETICCTPCT in the last 72 hours. Urine analysis:    Component Value Date/Time   COLORURINE YELLOW 01/07/2021 0932   APPEARANCEUR CLEAR 01/07/2021 0932   LABSPEC 1.045 (H) 01/07/2021 0932   PHURINE 6.0 01/07/2021 0932   GLUCOSEU NEGATIVE 01/07/2021 0932   HGBUR LARGE (A) 01/07/2021 0932   BILIRUBINUR NEGATIVE 01/07/2021 0932   KETONESUR 80 (A) 01/07/2021 0932   PROTEINUR 30 (A) 01/07/2021 0932   UROBILINOGEN 0.2 11/22/2020 0848   NITRITE NEGATIVE 01/07/2021 0932   LEUKOCYTESUR NEGATIVE 01/07/2021 0932   Sepsis Labs: _3 (procalcitonin:4,lacticacidven:4)  )No results found for  this or any previous visit (from the past 240 hour(s)).       Radiology Studies: CT BIOPSY  Result Date: 02/08/2021 INDICATION: DRESS syndrome. Retroperitoneal adenopathy of uncertain etiology. Please perform CT-guided bone marrow biopsy for tissue diagnostic purposes. EXAM: CT-GUIDED BONE MARROW BIOPSY AND ASPIRATION MEDICATIONS: Benadryl 25 mg IV ANESTHESIA/SEDATION: Versed 4 mg and Fentanyl 100 mcg was administered intravenously. Moderate Sedation Time: 10 minutes. The patient's level of consciousness and vital signs were monitored continuously by radiology nursing throughout the procedure under my direct supervision. COMPLICATIONS: None immediate. PROCEDURE: Informed  consent was obtained from the patient following an explanation of the procedure, risks, benefits and alternatives. The patient understands, agrees and consents for the procedure. All questions were addressed. A time out was performed prior to the initiation of the procedure. The patient was positioned prone and non-contrast localization CT was performed of the pelvis to demonstrate the iliac marrow spaces. The operative site was prepped and draped in the usual sterile fashion. Under sterile conditions and local anesthesia, a 22 gauge spinal needle was utilized for procedural planning. Next, an 11 gauge coaxial bone biopsy needle was advanced into the left iliac marrow space. Needle position was confirmed with CT imaging. Initially, a bone marrow aspiration was performed. Next, a bone marrow biopsy was obtained with the 11 gauge outer bone marrow device. The 11 gauge coaxial bone biopsy needle was re-advanced into a slightly different location within the left iliac marrow space, positioning was confirmed with CT imaging and an additional bone marrow biopsy was obtained. The needle was removed and superficial hemostasis was obtained with manual compression. A dressing was applied. The patient tolerated the procedure well without immediate  post procedural complication. IMPRESSION: Successful CT guided left iliac bone marrow aspiration and core biopsy. Electronically Signed   By: Sandi Mariscal M.D.   On: 01/19/2021 12:45   CT BONE MARROW BIOPSY & ASPIRATION  Result Date: 01/19/2021 INDICATION: DRESS syndrome. Retroperitoneal adenopathy of uncertain etiology. Please perform CT-guided bone marrow biopsy for tissue diagnostic purposes. EXAM: CT-GUIDED BONE MARROW BIOPSY AND ASPIRATION MEDICATIONS: Benadryl 25 mg IV ANESTHESIA/SEDATION: Versed 4 mg and Fentanyl 100 mcg was administered intravenously. Moderate Sedation Time: 10 minutes. The patient's level of consciousness and vital signs were monitored continuously by radiology nursing throughout the procedure under my direct supervision. COMPLICATIONS: None immediate. PROCEDURE: Informed consent was obtained from the patient following an explanation of the procedure, risks, benefits and alternatives. The patient understands, agrees and consents for the procedure. All questions were addressed. A time out was performed prior to the initiation of the procedure. The patient was positioned prone and non-contrast localization CT was performed of the pelvis to demonstrate the iliac marrow spaces. The operative site was prepped and draped in the usual sterile fashion. Under sterile conditions and local anesthesia, a 22 gauge spinal needle was utilized for procedural planning. Next, an 11 gauge coaxial bone biopsy needle was advanced into the left iliac marrow space. Needle position was confirmed with CT imaging. Initially, a bone marrow aspiration was performed. Next, a bone marrow biopsy was obtained with the 11 gauge outer bone marrow device. The 11 gauge coaxial bone biopsy needle was re-advanced into a slightly different location within the left iliac marrow space, positioning was confirmed with CT imaging and an additional bone marrow biopsy was obtained. The needle was removed and superficial hemostasis  was obtained with manual compression. A dressing was applied. The patient tolerated the procedure well without immediate post procedural complication. IMPRESSION: Successful CT guided left iliac bone marrow aspiration and core biopsy. Electronically Signed   By: Sandi Mariscal M.D.   On: 01/19/2021 12:45   VAS [place] UPPER EXTREMITY VENOUS DUPLEX  Result Date: 01/18/2021 UPPER VENOUS STUDY  Patient Name:  SHANETTA NICOLLS  Date of Exam:   01/18/2021 Medical Rec #: [id number]      Accession #:    [id number] Date of Birth: 02-Jan-2001      Patient Gender: F Patient Age:   20Y Exam Location:  [facility] Procedure:  VAS [place] UPPER EXTREMITY VENOUS DUPLEX Referring Phys: [id number] A CALDWELL POWELL JR --------------------------------------------------------------------------------  Indications: Pain bilaterally s/p IV Limitations: Bandaging/IV RT forearm. Comparison Study: No prior studies. Performing Technologist: Darlin Coco RDMS,RVT  Examination Guidelines: A complete evaluation includes B-mode imaging, spectral Doppler, color Doppler, and power Doppler as needed of all accessible portions of each vessel. Bilateral testing is considered an integral part of a complete examination. Limited examinations for reoccurring indications may be performed as noted.  Right Findings: +----------+------------+---------+-----------+----------+-----------------+ RIGHT     CompressiblePhasicitySpontaneousProperties     Summary      +----------+------------+---------+-----------+----------+-----------------+ IJV           Full       Yes       Yes                                +----------+------------+---------+-----------+----------+-----------------+ Subclavian    Full       Yes       Yes                                +----------+------------+---------+-----------+----------+-----------------+ Axillary      Full       Yes       Yes                                 +----------+------------+---------+-----------+----------+-----------------+ Brachial      Full                                                    +----------+------------+---------+-----------+----------+-----------------+ Radial        Full                                                    +----------+------------+---------+-----------+----------+-----------------+ Ulnar         Full                                                    +----------+------------+---------+-----------+----------+-----------------+ Cephalic    Partial      Yes       Yes              Age Indeterminate +----------+------------+---------+-----------+----------+-----------------+ Basilic       Full                                                    +----------+------------+---------+-----------+----------+-----------------+  Left Findings: +----------+------------+---------+-----------+----------+-----------------+ LEFT      CompressiblePhasicitySpontaneousProperties     Summary      +----------+------------+---------+-----------+----------+-----------------+ IJV           Full       Yes       Yes                                +----------+------------+---------+-----------+----------+-----------------+  Subclavian    Full       Yes       Yes                                +----------+------------+---------+-----------+----------+-----------------+ Axillary      Full       Yes       Yes                                +----------+------------+---------+-----------+----------+-----------------+ Brachial      Full                                                    +----------+------------+---------+-----------+----------+-----------------+ Radial        Full                                                    +----------+------------+---------+-----------+----------+-----------------+ Ulnar         Full                                                     +----------+------------+---------+-----------+----------+-----------------+ Cephalic      None       No        No               Age Indeterminate +----------+------------+---------+-----------+----------+-----------------+ Basilic       Full                                                    +----------+------------+---------+-----------+----------+-----------------+  Summary:  Right: No evidence of deep vein thrombosis in the upper extremity. 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  Left: No evidence of deep vein thrombosis in the upper extremity. Findings consistent with age indeterminate superficial vein thrombosis involving the left cephalic vein.  *See table(s) above for measurements and observations.  Diagnosing physician: Harold Barban MD Electronically signed by Harold Barban MD on 01/18/2021 at 52:16:28 PM.    Final         Scheduled Meds: . busPIRone  10 mg Oral BID  . diphenhydrAMINE      . [START ON 01/20/2021] enoxaparin (LOVENOX) injection  40 mg Subcutaneous Q24H  . fentaNYL      . loratadine  10 mg Oral Daily  . midazolam      . montelukast  10 mg Oral QHS  . nystatin  5 mL Oral QID  . pantoprazole  40 mg Oral Daily  . predniSONE  60 mg Oral Q breakfast  . sodium chloride flush  10-40 mL Intracatheter Q12H  . triamcinolone 0.1 % cream : eucerin  1 application Topical BID   Continuous Infusions:   LOS: 10 days    Time spent: 25 minutes  Edwin Dada, MD [facility] 01/19/2021, 4:35 PM     Please page though [place] or Epic secure chat:  For [employer], Adult nurse

## 2021-01-19 NOTE — Progress Notes (Signed)
MEDICATION-RELATED CONSULT NOTE   IR Procedure Consult - Anticoagulant/Antiplatelet PTA/Inpatient Med List Review by Pharmacist    Procedure: CT guided bone marrow aspiration and biopsy of left iliac crest.     Completed: 01/19/21 11:15 AM  Post-Procedural bleeding risk per IR MD assessment:  standard  Antithrombotic medications on inpatient or PTA profile prior to procedure:   Day plus 1, AM    Recommended restart time per IR Post-Procedure Guidelines:  5/13 AM   Other considerations:   Pt has been refusing LMWH but ambulating well   Plan:    LMWH 40 qday resume 5/12 10 am  Herby Abraham, Pharm.D 01/19/2021 12:00 PM

## 2021-01-19 NOTE — Plan of Care (Signed)

## 2021-01-19 NOTE — Procedures (Signed)
Pre-procedure Diagnosis: DRESS syndrome Post-procedure Diagnosis: Same  Technically successful CT guided bone marrow aspiration and biopsy of left iliac crest.   Complications: None Immediate  EBL: None  Signed: Simonne Come Pager: [phone] 01/19/2021, 11:15 AM

## 2021-01-20 DIAGNOSIS — T50905A Adverse effect of unspecified drugs, medicaments and biological substances, initial encounter: Secondary | ICD-10-CM | POA: Diagnosis not present

## 2021-01-20 DIAGNOSIS — D7212 Drug rash with eosinophilia and systemic symptoms syndrome: Secondary | ICD-10-CM | POA: Diagnosis not present

## 2021-01-20 LAB — CBC WITH DIFFERENTIAL/PLATELET
Abs Immature Granulocytes: 0.51 10*3/uL — ABNORMAL HIGH (ref 0.00–0.07)
Basophils Absolute: 0.1 10*3/uL (ref 0.0–0.1)
Basophils Relative: 1 %
Eosinophils Absolute: 0.7 10*3/uL — ABNORMAL HIGH (ref 0.0–0.5)
Eosinophils Relative: 5 %
HCT: 38.1 % (ref 36.0–46.0)
Hemoglobin: 12.9 g/dL (ref 12.0–15.0)
Immature Granulocytes: 4 %
Lymphocytes Relative: 37 %
Lymphs Abs: 4.8 10*3/uL — ABNORMAL HIGH (ref 0.7–4.0)
MCH: 28.2 pg (ref 26.0–34.0)
MCHC: 33.9 g/dL (ref 30.0–36.0)
MCV: 83.2 fL (ref 80.0–100.0)
Monocytes Absolute: 1.2 10*3/uL — ABNORMAL HIGH (ref 0.1–1.0)
Monocytes Relative: 9 %
Neutro Abs: 5.7 10*3/uL (ref 1.7–7.7)
Neutrophils Relative %: 44 %
Platelets: 425 10*3/uL — ABNORMAL HIGH (ref 150–400)
RBC: 4.58 MIL/uL (ref 3.87–5.11)
RDW: 14.6 % (ref 11.5–15.5)
WBC: 12.8 10*3/uL — ABNORMAL HIGH (ref 4.0–10.5)
nRBC: 0 % (ref 0.0–0.2)

## 2021-01-20 LAB — GLUCOSE, CAPILLARY
Glucose-Capillary: 80 mg/dL (ref 70–99)
Glucose-Capillary: 185 mg/dL — ABNORMAL HIGH (ref 70–99)
Glucose-Capillary: 207 mg/dL — ABNORMAL HIGH (ref 70–99)
Glucose-Capillary: 183 mg/dL — ABNORMAL HIGH (ref 70–99)

## 2021-01-20 MED ORDER — PROCHLORPERAZINE MALEATE 10 MG PO TABS
10.0000 mg | ORAL_TABLET | Freq: Four times a day (QID) | ORAL | Status: DC | PRN
  Administered 2021-01-20: 10 mg via ORAL
  Filled 2021-01-20: qty 1

## 2021-01-20 MED ORDER — HYDROXYZINE HCL 25 MG PO TABS
25.0000 mg | ORAL_TABLET | Freq: Three times a day (TID) | ORAL | Status: DC | PRN
  Administered 2021-01-20 (×2): 25 mg via ORAL
  Filled 2021-01-20 (×2): qty 1

## 2021-01-20 MED ORDER — OXYCODONE HCL 5 MG PO TABS
7.5000 mg | ORAL_TABLET | ORAL | Status: DC | PRN
  Administered 2021-01-20 – 2021-01-21 (×5): 7.5 mg via ORAL
  Filled 2021-01-20 (×5): qty 2

## 2021-01-20 MED ORDER — DIPHENHYDRAMINE HCL 25 MG PO CAPS
25.0000 mg | ORAL_CAPSULE | Freq: Four times a day (QID) | ORAL | Status: DC | PRN
  Administered 2021-01-20 – 2021-01-21 (×3): 25 mg via ORAL
  Filled 2021-01-20 (×3): qty 1

## 2021-01-20 NOTE — Plan of Care (Signed)

## 2021-01-20 NOTE — Progress Notes (Signed)
Ms. Lacson had a bone marrow biopsy yesterday.  Maybe, there will be some preliminary results today.  Her rash looks a little bit better.  She may not be as pruritic.  I do not think that she has had any temperatures.  This morning, her temperature is 98.3.  There is no CBC back yet today.  She is having some discomfort where she has a bone marrow biopsy done.  Hopefully, this should resolve.  We just have to await the bone marrow biopsy result.  Again, I cannot imagine that this is going to shows a type of hematologic issue.  Hopefully, she will be going home on Sunday.  Lattie Haw, MD  Daisy Lazar 3:17

## 2021-01-20 NOTE — Progress Notes (Signed)
[facility] PROGRESS NOTE    Sapphire Tygart  WSF:[id number] DOB: [birth year] DOA: 01/07/2021 PCP: Lynetta Mare, NP      Brief Narrative:  Ms Palma is a 20 y.o. F with hx asthma, mood disorder who presented with abdominal pain, fever symptoms progressively worsening over weeks.  Found to have transaminitis, eosinophilia, lymphadenopathy, new maculopapular rash.  Noted to have started Lamictal 6 weeks prior.      Assessment & Plan:  DRESS Improving -Continue pred -Continue topical -Has appt on Tues with Derm   Anxiety -Continue Buspar   Allergies -Continue claritin and Singulair       DVT prophylaxis: enoxaparin (LOVENOX) injection 40 mg Start: 01/20/21 1000  Code Status: FULL Family Communication: mother by phone          Subjective: Nausea today, Without vomiting.  Back pain at the site of her bone marrow biopsy.  Rash improving.  No confusion, fever.        Objective: Vitals:   01/19/21 1501 01/19/21 2014 01/20/21 0445 01/20/21 1303  BP: 110/71 120/68 (!) 97/55 122/82  Pulse: (!) 106 97 96 (!) 109  Resp: $Remo'19 18 18 18  'uMgfM$ Temp: 97.6 F (36.4 C) 98.6 F (37 C) 98.3 F (36.8 C) 97.7 F (36.5 C)  TempSrc: Oral Oral Oral Oral  SpO2: 99% 100% 97% 99%  Weight:      Height:        Intake/Output Summary (Last 24 hours) at 01/20/2021 1803 Last data filed at 01/20/2021 1300 Gross per 24 hour  Intake 240 ml  Output --  Net 240 ml   Filed Weights   01/07/21 0600  Weight: 61.2 kg    Examination: General appearance: Adult female, sitting up in bed, no acute distress     HEENT:    Skin: Maculopapular rash, slightly fainter today Cardiac: RRR, no murmurs, no lower extremity edema Respiratory: Normal respiratory rate and rhythm, lungs clear without rales or wheezes Abdomen:   MSK:  Neuro:   Psych:      Data Reviewed: I have personally reviewed following labs and imaging studies:  CBC: Recent Labs  Lab 01/15/21 0746  01/16/21 0514 01/17/21 0532 01/18/21 0512 01/19/21 0508 01/20/21 0805  WBC 23.7* 23.2* 18.3* 14.8* 12.7* 12.8*  NEUTROABS 11.5*  --  11.7* 10.1* 7.8* 5.7  HGB 11.6* 11.8* 12.0 12.3 11.9* 12.9  HCT 34.5* 35.6* 35.7* 36.8 36.2 38.1  MCV 84.6 84.4 85.0 86.2 87.0 83.2  PLT 416* 406* 448* 474* 441* 017*   Basic Metabolic Panel: Recent Labs  Lab 01/14/21 0539 01/15/21 0746 01/16/21 0514 01/17/21 0532 01/18/21 0512 01/19/21 0508  NA 143 137 137 136 135 133*  K 3.9 3.7 4.6 4.0 4.7 4.5  CL 109 102 104 102 102 102  CO2 $Re'24 27 26 26 25 24  'RRW$ GLUCOSE 147* 145* 163* 156* 159* 117*  BUN $Re'12 14 15 15 16 19  'eyL$ CREATININE 0.39* 0.45 0.49 <0.30* 0.49 0.50  CALCIUM 8.6* 8.6* 8.7* 8.6* 8.6* 8.3*  MG 2.3  --  2.5* 2.4 2.5*  --   PHOS 4.6  --  4.3 4.7* 4.4  --    GFR: Estimated Creatinine Clearance: 95.7 mL/min (by C-G formula based on SCr of 0.5 mg/dL). Liver Function Tests: Recent Labs  Lab 01/15/21 0746 01/16/21 0514 01/17/21 0532 01/18/21 0512 01/19/21 0508  AST 41 $Remo'25 19 23 29  'GLAgW$ ALT 168* 128* 101* 90* 83*  ALKPHOS 123 98 85 74 67  BILITOT 0.4 0.2* 0.3 0.3  0.6  PROT 7.1 6.4* 6.3* 6.5 6.2*  ALBUMIN 3.6 3.5 3.3* 3.4* 3.2*   No results for input(s): LIPASE, AMYLASE in the last 168 hours. No results for input(s): AMMONIA in the last 168 hours. Coagulation Profile: No results for input(s): INR, PROTIME in the last 168 hours. Cardiac Enzymes: No results for input(s): CKTOTAL, CKMB, CKMBINDEX, TROPONINI in the last 168 hours. BNP (last 3 results) No results for input(s): PROBNP in the last 8760 hours. HbA1C: No results for input(s): HGBA1C in the last 72 hours. CBG: Recent Labs  Lab 01-28-2021 1622 2021-01-28 2239 01/20/21 0801 01/20/21 1130 01/20/21 1649  GLUCAP 136* 182* 80 185* 207*   Lipid Profile: No results for input(s): CHOL, HDL, LDLCALC, TRIG, CHOLHDL, LDLDIRECT in the last 72 hours. Thyroid Function Tests: Recent Labs    01/18/21 0512  TSH 0.515   Anemia Panel: No  results for input(s): VITAMINB12, FOLATE, FERRITIN, TIBC, IRON, RETICCTPCT in the last 72 hours. Urine analysis:    Component Value Date/Time   COLORURINE YELLOW 01/07/2021 0932   APPEARANCEUR CLEAR 01/07/2021 0932   LABSPEC 1.045 (H) 01/07/2021 0932   PHURINE 6.0 01/07/2021 0932   GLUCOSEU NEGATIVE 01/07/2021 0932   HGBUR LARGE (A) 01/07/2021 0932   BILIRUBINUR NEGATIVE 01/07/2021 0932   KETONESUR 80 (A) 01/07/2021 0932   PROTEINUR 30 (A) 01/07/2021 0932   UROBILINOGEN 0.2 11/22/2020 0848   NITRITE NEGATIVE 01/07/2021 0932   LEUKOCYTESUR NEGATIVE 01/07/2021 0932   Sepsis Labs: $RemoveBefo'[email or web address]'[id number]$ (procalcitonin:4,lacticacidven:4)  )No results found for this or any previous visit (from the past 240 hour(s)).       Radiology Studies: CT BIOPSY  Result Date: 01/28/21 INDICATION: DRESS syndrome. Retroperitoneal adenopathy of uncertain etiology. Please perform CT-guided bone marrow biopsy for tissue diagnostic purposes. EXAM: CT-GUIDED BONE MARROW BIOPSY AND ASPIRATION MEDICATIONS: Benadryl 25 mg IV ANESTHESIA/SEDATION: Versed 4 mg and Fentanyl 100 mcg was administered intravenously. Moderate Sedation Time: 10 minutes. The patient's level of consciousness and vital signs were monitored continuously by radiology nursing throughout the procedure under my direct supervision. COMPLICATIONS: None immediate. PROCEDURE: Informed consent was obtained from the patient following an explanation of the procedure, risks, benefits and alternatives. The patient understands, agrees and consents for the procedure. All questions were addressed. A time out was performed prior to the initiation of the procedure. The patient was positioned prone and non-contrast localization CT was performed of the pelvis to demonstrate the iliac marrow spaces. The operative site was prepped and draped in the usual sterile fashion. Under sterile conditions and local anesthesia, a 22 gauge spinal needle was utilized for procedural  planning. Next, an 11 gauge coaxial bone biopsy needle was advanced into the left iliac marrow space. Needle position was confirmed with CT imaging. Initially, a bone marrow aspiration was performed. Next, a bone marrow biopsy was obtained with the 11 gauge outer bone marrow device. The 11 gauge coaxial bone biopsy needle was re-advanced into a slightly different location within the left iliac marrow space, positioning was confirmed with CT imaging and an additional bone marrow biopsy was obtained. The needle was removed and superficial hemostasis was obtained with manual compression. A dressing was applied. The patient tolerated the procedure well without immediate post procedural complication. IMPRESSION: Successful CT guided left iliac bone marrow aspiration and core biopsy. Electronically Signed   By: Sandi Mariscal M.D.   On: 28-Jan-2021 12:45   CT BONE MARROW BIOPSY & ASPIRATION  Result Date: 28-Jan-2021 INDICATION: DRESS syndrome. Retroperitoneal adenopathy of uncertain etiology. Please perform CT-guided  bone marrow biopsy for tissue diagnostic purposes. EXAM: CT-GUIDED BONE MARROW BIOPSY AND ASPIRATION MEDICATIONS: Benadryl 25 mg IV ANESTHESIA/SEDATION: Versed 4 mg and Fentanyl 100 mcg was administered intravenously. Moderate Sedation Time: 10 minutes. The patient's level of consciousness and vital signs were monitored continuously by radiology nursing throughout the procedure under my direct supervision. COMPLICATIONS: None immediate. PROCEDURE: Informed consent was obtained from the patient following an explanation of the procedure, risks, benefits and alternatives. The patient understands, agrees and consents for the procedure. All questions were addressed. A time out was performed prior to the initiation of the procedure. The patient was positioned prone and non-contrast localization CT was performed of the pelvis to demonstrate the iliac marrow spaces. The operative site was prepped and draped in the  usual sterile fashion. Under sterile conditions and local anesthesia, a 22 gauge spinal needle was utilized for procedural planning. Next, an 11 gauge coaxial bone biopsy needle was advanced into the left iliac marrow space. Needle position was confirmed with CT imaging. Initially, a bone marrow aspiration was performed. Next, a bone marrow biopsy was obtained with the 11 gauge outer bone marrow device. The 11 gauge coaxial bone biopsy needle was re-advanced into a slightly different location within the left iliac marrow space, positioning was confirmed with CT imaging and an additional bone marrow biopsy was obtained. The needle was removed and superficial hemostasis was obtained with manual compression. A dressing was applied. The patient tolerated the procedure well without immediate post procedural complication. IMPRESSION: Successful CT guided left iliac bone marrow aspiration and core biopsy. Electronically Signed   By: Sandi Mariscal M.D.   On: 01/19/2021 12:45        Scheduled Meds: . busPIRone  10 mg Oral BID  . enoxaparin (LOVENOX) injection  40 mg Subcutaneous Q24H  . loratadine  10 mg Oral Daily  . montelukast  10 mg Oral QHS  . nystatin  5 mL Oral QID  . pantoprazole  40 mg Oral Daily  . predniSONE  60 mg Oral Q breakfast  . sodium chloride flush  10-40 mL Intracatheter Q12H  . triamcinolone 0.1 % cream : eucerin  1 application Topical BID   Continuous Infusions:   LOS: 11 days    Time spent: 25 minutes    Edwin Dada, MD [facility] 01/20/2021, 6:03 PM     Please page though [place] or Epic secure chat:  For [employer], Adult nurse

## 2021-01-21 DIAGNOSIS — T50905A Adverse effect of unspecified drugs, medicaments and biological substances, initial encounter: Secondary | ICD-10-CM | POA: Diagnosis not present

## 2021-01-21 DIAGNOSIS — D7212 Drug rash with eosinophilia and systemic symptoms syndrome: Secondary | ICD-10-CM | POA: Diagnosis not present

## 2021-01-21 LAB — COMPREHENSIVE METABOLIC PANEL
ALT: 135 U/L — ABNORMAL HIGH (ref 0–44)
AST: 43 U/L — ABNORMAL HIGH (ref 15–41)
Albumin: 3.8 g/dL (ref 3.5–5.0)
Alkaline Phosphatase: 60 U/L (ref 38–126)
Anion gap: 7 (ref 5–15)
BUN: 15 mg/dL (ref 6–20)
CO2: 29 mmol/L (ref 22–32)
Calcium: 8.6 mg/dL — ABNORMAL LOW (ref 8.9–10.3)
Chloride: 96 mmol/L — ABNORMAL LOW (ref 98–111)
Creatinine, Ser: 0.57 mg/dL (ref 0.44–1.00)
GFR, Estimated: >60 mL/min (ref >60)
Glucose, Bld: 80 mg/dL (ref 70–99)
Potassium: 4.3 mmol/L (ref 3.5–5.1)
Sodium: 132 mmol/L — ABNORMAL LOW (ref 135–145)
Total Bilirubin: 0.6 mg/dL (ref 0.3–1.2)
Total Protein: 6.8 g/dL (ref 6.5–8.1)

## 2021-01-21 LAB — CBC WITH DIFFERENTIAL/PLATELET
Abs Immature Granulocytes: 0.36 10*3/uL — ABNORMAL HIGH (ref 0.00–0.07)
Basophils Absolute: 0.1 10*3/uL (ref 0.0–0.1)
Basophils Relative: 1 %
Eosinophils Absolute: 0.4 10*3/uL (ref 0.0–0.5)
Eosinophils Relative: 4 %
HCT: 39.7 % (ref 36.0–46.0)
Hemoglobin: 13.1 g/dL (ref 12.0–15.0)
Immature Granulocytes: 3 %
Lymphocytes Relative: 47 %
Lymphs Abs: 6 10*3/uL — ABNORMAL HIGH (ref 0.7–4.0)
MCH: 28.1 pg (ref 26.0–34.0)
MCHC: 33 g/dL (ref 30.0–36.0)
MCV: 85 fL (ref 80.0–100.0)
Monocytes Absolute: 1.1 10*3/uL — ABNORMAL HIGH (ref 0.1–1.0)
Monocytes Relative: 9 %
Neutro Abs: 4.6 10*3/uL (ref 1.7–7.7)
Neutrophils Relative %: 36 %
Platelets: 450 10*3/uL — ABNORMAL HIGH (ref 150–400)
RBC: 4.67 MIL/uL (ref 3.87–5.11)
RDW: 14.7 % (ref 11.5–15.5)
WBC: 12.6 10*3/uL — ABNORMAL HIGH (ref 4.0–10.5)
nRBC: 0 % (ref 0.0–0.2)

## 2021-01-21 LAB — GLUCOSE, CAPILLARY: Glucose-Capillary: 102 mg/dL — ABNORMAL HIGH (ref 70–99)

## 2021-01-21 MED ORDER — ONDANSETRON HCL 4 MG PO TABS: 4.0000 mg | ORAL_TABLET | Freq: Three times a day (TID) | ORAL | 0 refills | Status: DC | PRN

## 2021-01-21 MED ORDER — HYDROXYZINE HCL 25 MG PO TABS: 25.0000 mg | ORAL_TABLET | Freq: Three times a day (TID) | ORAL | 1 refills | Status: DC | PRN

## 2021-01-21 MED ORDER — TRIAMCINOLONE ACETONIDE 0.1 % EX CREA: 1.0000 "application " | TOPICAL_CREAM | Freq: Two times a day (BID) | CUTANEOUS | 2 refills | Status: DC

## 2021-01-21 MED ORDER — PREDNISONE 10 MG PO TABS: ORAL_TABLET | ORAL | 0 refills | Status: DC

## 2021-01-21 NOTE — Discharge Summary (Signed)
Physician Discharge Summary  Alexandra Young EAV:[id number] DOB: 2001-01-28 DOA: 01/07/2021  PCP: Lynetta Mare, NP  Admit date: 01/07/2021 Discharge date: 01/21/2021  Admitted From: Home  Disposition:  Home   Recommendations for Outpatient Follow-up:  1. Follow up with [facility] in 3 days 2. Please follow glucose and LFTs at follow up     Home Health: None  Equipment/Devices: None  Discharge Condition: Good  CODE STATUS: FULL Diet recommendation: Regular  Brief/Interim Summary: Alexandra Young is a 20 y.o. F with no significant PMHx other than anxiety for which she had just started lamotrigine and IBS who presented with few days malaise and abdominal cramps and fevers and nausea.   In the ER, CT abdomen showed splenomegaly, nonspecific retroperitoneal lymphadenopathy and nothing else.  LFTs were elevated.  She was admitted for further work up.        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  An extensive work up for infectious or hematologic causes of splenomegaly and lymphadenopathy and transaminitis was begun, until the patient developed a diffuse maculopapular rash and it was realized that she had eosinophilia, lymphadeopathy in multiple beds and had started lamotrigine 6 weeks earlier.  Lamotrigine was stopped 5/4 and LFTs started improving 5/5.  Renal function was normal, and she had a cough but normal imaging of the lungs on CT.  Her abdominal pain and fevers resolved.  Her rash became progressively more uncomfortable and she was started on Solumedrol.  Her rash continued to worsen over 1-2 days and Solumedrol was increased to 60 mg q6hrs.   Case was discussed with [facility] Dermat[facility] fellows, close follow up was arranged.  Given 6 week prednisone taper and triamcinolone 0.1 cream.       Superficial cephalic vein  thrombosis Patient developed bicep pain bilaterally, ultrasound ruled out DVT, showed bilateral superficial vein thrombosis.  Treated symptomatically.  Steroid-induced hyperglycemia Please repeat glucose at outpatient follow up.  Myelocyte on smear Myelocytes noted on smear.  Hematology ordered bone marrow biopsy which is pending at discharge.          Discharge Instructions  Discharge Instructions    Ambulatory referral to Dermatology   Complete by: As directed    To [facility] For DRESS syndrome   Discharge instructions   Complete by: As directed    From Dr. Loleta Books: You were admitted for DRESS syndrome We believe this was caused by lamotrigine/Lamictal  Do NOT take lamotrigine/Lamictal again ever.  For now: Use the steroid cream triamcinolone 0.1% cream to the whole body (or most affected areas) once or twice daily Take prednisone on a 6 week taper:      Take prednisone 60 mg (6 tabs) for 7 days the week starting Sat May 14, then       Take prednisone 50 mg (5 tabs) for 7 days the week starting Sat May 21, then       Take prednisone 40 mg (4 tabs) for 7 days the week starting Sat May 28, then       Take prednisone 30 mg (3 tabs) for 7 days the week starting Sat Jun 4, then       Take prednisone 20 mg (2 tabs) for 7 days the week starting Sat Jun 11, then       Take prednisone 10 mg (1 tab) for 7 days the week starting Sat  Jun 18, then stop  At any time, Dermatology can adjust this  For itching, take hydroxyzine 25 mg up to three times per day  You may take acetaminophen or Vicodin for pain, but if you do, make sure you don't take more than 3000 mg of acetaminophen in a day (including in your headache medicine Fioricet if you take that)   Go see Dr. Shaaron Adler on Tuesday  The appointment time and location is in the To Do section below Call her office with questions   Increase activity slowly   Complete by: As directed    No wound care   Complete by:  As directed      Allergies as of 01/21/2021   No Known Allergies     Medication List    STOP taking these medications   EXCEDRIN PO   ibuprofen 600 MG tablet Commonly known as: ADVIL   lamoTRIgine 25 MG tablet Commonly known as: LAMICTAL     TAKE these medications   albuterol 108 (90 Base) MCG/ACT inhaler Commonly known as: VENTOLIN HFA Inhale 2 puffs into the lungs every 6 (six) hours as needed for wheezing or shortness of breath.   busPIRone 10 MG tablet Commonly known as: BUSPAR Take 10 mg by mouth 2 (two) times daily.   butalbital-acetaminophen-caffeine 50-325-40 MG tablet Commonly known as: FIORICET Take 1 tablet by mouth every 6 (six) hours as needed for migraine.   cetirizine 10 MG tablet Commonly known as: ZYRTEC Take 10 mg by mouth 2 (two) times daily.   Emgality 120 MG/ML Soaj Generic drug: Galcanezumab-gnlm Inject 120 mg into the skin every 30 (thirty) days.   fluticasone 50 MCG/ACT nasal spray Commonly known as: FLONASE Place 2 sprays into both nostrils daily as needed for allergies.   HYDROcodone-acetaminophen 5-325 MG tablet Commonly known as: NORCO/VICODIN Take 1 tablet by mouth every 8 (eight) hours as needed for moderate pain.   hydrOXYzine 25 MG tablet Commonly known as: ATARAX/VISTARIL Take 1 tablet (25 mg total) by mouth 3 (three) times daily as needed for itching.   linaclotide 145 MCG Caps capsule Commonly known as: LINZESS Take 145 mcg by mouth daily as needed (constipation).   loperamide 2 MG capsule Commonly known as: IMODIUM Take 2 mg by mouth daily as needed for diarrhea or loose stools.   montelukast 10 MG tablet Commonly known as: SINGULAIR Take 10 mg by mouth at bedtime.   Nexplanon 68 MG Impl implant Generic drug: etonogestrel 68 mg by Subdermal route continuous.   omeprazole 40 MG capsule Commonly known as: PRILOSEC Take 40 mg by mouth daily.   ondansetron 4 MG tablet Commonly known as: ZOFRAN Take 1 tablet (4  mg total) by mouth every 8 (eight) hours as needed for nausea or vomiting.   polyethylene glycol 17 g packet Commonly known as: MIRALAX / GLYCOLAX Take 17 g by mouth daily as needed for mild constipation.   predniSONE 10 MG tablet Commonly known as: DELTASONE Take prednisone 60 mg (6 tabs) daily for 1 week then 50 mg (5 tabs) for 1 week then 40 mg (4 tabs) for 1 week then 30 mg (3 tabs) for 1 week then 20 mg for 1 week then 10 mg for 1 week   traZODone 50 MG tablet Commonly known as: DESYREL Take 50 mg by mouth at bedtime as needed for sleep.   triamcinolone cream 0.1 % Commonly known as: KENALOG Apply 1 application topically 2 (two) times daily. Apply to affected areas from neck down  Vitamin D (Ergocalciferol) 1.25 MG (50000 UNIT) Caps capsule Commonly known as: DRISDOL Take 50,000 Units by mouth every 7 (seven) days.       [facility].   Specialty: Behavioral Health Why: Another option for psychiatry, therapy follow up Contact information: [place] [facility].   Why: One option for psychiatry, therapist follow up Contact information: [place] [address] [phone]        Darreld Mclean, MD. Go in 2 day(s).   Specialty: Dermatology Why: Tuesday May 17 at 2:15PM [place]. [place], [place] information: [place] [address] [phone]              No Known Allergies  Consultations:  Psychiatry   Procedures/Studies: CT CHEST W CONTRAST  Result Date: 01/09/2021 CLINICAL DATA:  Fever of unknown origin, prominent spleen EXAM: CT CHEST WITH CONTRAST TECHNIQUE: Multidetector CT imaging of the chest was performed during intravenous contrast administration. CONTRAST:  71mL OMNIPAQUE IOHEXOL 300 MG/ML  SOLN COMPARISON:  None. FINDINGS:  Cardiovascular: Heart is normal in size.  No pericardial effusion. No evidence of thoracic aortic aneurysm. Mediastinum/Nodes: No suspicious mediastinal lymphadenopathy. Small bilateral axillary nodes measuring up to 8 mm short axis on the left (series 2/image 26), likely reactive. Triangular soft tissue in the anterior mediastinum (series 2/image 49) reflects residual thymus. Visualized thyroid is unremarkable. Lungs/Pleura: Lungs are clear. No suspicious pulmonary nodules. No focal consolidation. Trace right pleural effusion (series 2/image 110). No pneumothorax. Upper Abdomen: Visualized upper abdomen is unchanged from recent CT abdomen/pelvis, noting a prominent spleen. Musculoskeletal: Visualized osseous structures are within normal limits. IMPRESSION: Trace right pleural effusion. Otherwise negative CT chest. Electronically Signed   By: Julian Hy M.D.   On: 01/09/2021 10:39   CT ANGIO CHEST PE W OR WO CONTRAST  Result Date: 01/16/2021 CLINICAL DATA:  20 year old female with chest pain and shortness of breath. EXAM: CT ANGIOGRAPHY CHEST WITH CONTRAST TECHNIQUE: Multidetector CT imaging of the chest was performed using the standard protocol during bolus administration of intravenous contrast. Multiplanar CT image reconstructions and MIPs were obtained to evaluate the vascular anatomy. CONTRAST:  122mL OMNIPAQUE IOHEXOL 350 MG/ML SOLN COMPARISON:  Chest CT dated 01/09/2021. FINDINGS: Cardiovascular: There is no cardiomegaly or pericardial effusion. The thoracic aorta is unremarkable. No pulmonary artery embolus identified. Mediastinum/Nodes: There is no hilar or mediastinal adenopathy. The esophagus and the thyroid gland are grossly unremarkable. No mediastinal fluid collection. Lungs/Pleura: The lungs are clear. There is no pleural effusion or pneumothorax. The central airways are patent. Upper Abdomen: No acute abnormality. Musculoskeletal: No chest wall abnormality. No acute or significant osseous  findings. Review of the MIP images confirms the above findings. IMPRESSION: No acute intrathoracic pathology. No CT evidence of pulmonary embolism. Electronically Signed   By: Anner Crete M.D.   On: 01/16/2021 22:20   US Pelvis Complete  Result Date: 01/04/2021 CLINICAL DATA:  Initial evaluation for acute left pelvic pain, evaluate for torsion. EXAM: TRANSABDOMINAL ULTRASOUND OF PELVIS DOPPLER ULTRASOUND OF OVARIES TECHNIQUE: Transabdominal ultrasound examination of the pelvis was performed including evaluation of the uterus, ovaries, adnexal regions, and pelvic cul-de-sac. Color and duplex Doppler ultrasound was utilized to evaluate blood flow to the ovaries. COMPARISON:  Prior CT from earlier the same day. FINDINGS: Uterus Measurements: 6.1 x 2.9 x  4.0 cm = volume: 37.7 mL. Uterus is retroverted. No discrete fibroid or other mass. Endometrium Thickness: 5.0 mm.  No focal abnormality visualized. Right ovary Measurements: 3.1 x 1.5 x 2.2 cm = volume: 5.4 mL. Normal appearance/no adnexal mass. Left ovary Measurements: 3.4 x 2.2 x 2.7 cm = volume: 11.3 mL. Normal appearance/no adnexal mass. 1.8 cm dominant follicle noted. Pulsed Doppler evaluation demonstrates normal low-resistance arterial and venous waveforms in both ovaries. Other: Trace free fluid within the pelvis, likely physiologic. IMPRESSION: 1. 1.8 cm left ovarian dominant follicle, which could contribute to left-sided pelvic pain. Associated trace free fluid within the pelvis. 2. Otherwise unremarkable and normal pelvic ultrasound. No evidence for ovarian torsion. Electronically Signed   By: Jeannine Boga M.D.   On: 01/04/2021 19:58   CT ABDOMEN PELVIS W CONTRAST  Result Date: 01/11/2021 CLINICAL DATA:  Abdominal pain, fever, rash EXAM: CT ABDOMEN AND PELVIS WITH CONTRAST TECHNIQUE: Multidetector CT imaging of the abdomen and pelvis was performed using the standard protocol following bolus administration of intravenous contrast. CONTRAST:   23mL OMNIPAQUE IOHEXOL 300 MG/ML  SOLN COMPARISON:  01/07/2021 FINDINGS: Lower chest: No acute pleural or parenchymal lung disease. Hepatobiliary: Interval progression of the gallbladder wall thickening and pericholecystic fluid seen on prior ultrasound. Additionally, periportal edema has developed. No focal liver parenchymal abnormalities. No biliary dilation. Pancreas: Unremarkable. No pancreatic ductal dilatation or surrounding inflammatory changes. Spleen: Stable splenomegaly without focal parenchymal abnormality. Adrenals/Urinary Tract: Adrenal glands are unremarkable. Kidneys are normal, without renal calculi, focal lesion, or hydronephrosis. Bladder is unremarkable. Stomach/Bowel: No bowel obstruction or ileus. Normal appendix right lower quadrant. No bowel wall thickening or inflammatory change. Vascular/Lymphatic: No significant vascular findings are present. Stable retroperitoneal lymphadenopathy, largest lymph node in the left para-aortic region measuring up to 21 x 11 mm. Stable borderline enlarged bilateral inguinal lymph nodes. Reproductive: Uterus and bilateral adnexa are unremarkable. Other: There is a small amount of ascites within the lower pelvis, increased in volume since prior study. No free intraperitoneal gas. No abdominal wall hernia. Musculoskeletal: No acute or destructive bony lesions. Reconstructed images demonstrate no additional findings. IMPRESSION: 1. Progressive gallbladder wall thickening and pericholecystic fluid, with development of periportal edema. Findings could reflect acalculous cholecystitis, progressive hepatic dysfunction, or hepatitis. 2. Splenomegaly and retroperitoneal lymphadenopathy, not appreciably changed. Findings could be reactive and related to underlying infection or due to lymphoproliferative disorder. 3. Small volume ascites within the lower pelvis, increased in volume since prior study. Electronically Signed   By: Randa Ngo M.D.   On: 01/11/2021 21:43    CT ABDOMEN PELVIS W CONTRAST  Result Date: 01/07/2021 CLINICAL DATA:  20 year old with abdominal pain. Suspect pyelonephritis. EXAM: CT ABDOMEN AND PELVIS WITH CONTRAST TECHNIQUE: Multidetector CT imaging of the abdomen and pelvis was performed using the standard protocol following bolus administration of intravenous contrast. CONTRAST:  126mL OMNIPAQUE IOHEXOL 300 MG/ML  SOLN COMPARISON:  CT renal stone protocol 01/04/2021 and pelvic ultrasound 01/04/2021 FINDINGS: Lower chest: Lung bases are clear.  No pleural effusions. Hepatobiliary: Normal appearance of the liver, gallbladder and portal venous system. No biliary dilatation Pancreas: Unremarkable. No pancreatic ductal dilatation or surrounding inflammatory changes. Spleen: Spleen is prominent for size measuring 12.7 cm in the AP dimension. Adrenals/Urinary Tract: Normal adrenal glands. Normal appearance of both kidneys without hydronephrosis or perinephric edema. Normal appearance of the urinary bladder. Stomach/Bowel: Stomach is within normal limits. Appendix appears normal. No evidence of bowel wall thickening, distention, or inflammatory changes. Vascular/Lymphatic: Normal appearance of the abdominal aorta.  Normal appearance of the venous structures. There is prominent periaortic soft tissue particularly along the left side of the aorta. Soft tissue measures up to 1.8 cm on sequence 2 image 35. There was a similar finding on the recent comparison CT. Findings are compatible with enlarged lymph nodes in this area. Small lymph nodes along the anterior aspect of the distal IVC. No significant pelvic lymph node enlargement. Symmetric lymph nodes along the pelvic sidewall measure 0.7 cm on sequence 2, image 68. Reproductive: 2.0 cm low-density structure in the left adnexa could represent a prominent follicle and compatible with the recent ultrasound findings. Normal appearance of the uterus. Other: Small amount of free fluid in the pelvis. Musculoskeletal:  No acute bone abnormality. IMPRESSION: 1. No acute abnormality in the abdomen or pelvis. Specifically, no evidence for hydronephrosis or pyelonephritis. 2. Slightly prominent retroperitoneal lymph nodes particularly along the left side of the aorta. Spleen is also prominent for size. Findings are nonspecific but could be related to a lymphoproliferative process or infectious etiology such as mononucleosis. 3. Small amount of free fluid in the pelvis is likely physiologic. Electronically Signed   By: Markus Daft M.D.   On: 01/07/2021 09:56   NM Hepato W/EF  Result Date: 01/11/2021 CLINICAL DATA:  Abdominal pain EXAM: NUCLEAR MEDICINE HEPATOBILIARY IMAGING WITH GALLBLADDER EF TECHNIQUE: Sequential images of the abdomen were obtained out to 60 minutes following intravenous administration of radiopharmaceutical. After oral ingestion of Ensure, gallbladder ejection fraction was determined. At 60 min, normal ejection fraction is greater than 33%. RADIOPHARMACEUTICALS:  5.28 mCi Tc-56m  Choletec IV COMPARISON:  Ultrasound Jan 10, 2021 FINDINGS: There is slightly delayed but uniform radiotracer uptake by the liver with delayed hepatic radiotracer clearance. Normal filling of the intrahepatic ducts, common bile duct. Gallbladder activity is visualized, consistent with patency of cystic duct (normal < 60 minutes). Additionally there is normal biliary to bowel transit (normal < 60 minutes), consistent with patent common bile duct. Ensure was administered and the gallbladder appears to empty normally on sequential images. Calculated gallbladder ejection fraction is 29%. (Normal gallbladder ejection fraction with Ensure is greater than 33%.) No evidence of enterogastric biliary reflux. IMPRESSION: Slightly delayed hepatic radiotracer uptake with delayed hepatic clearance of radiotracer, suggestive of hepatocellular dysfunction/hepatitis. In the setting of hepatocellular dysfunction the evaluation of cholecystitis by HIDA is  somewhat limited. Within this context there is appropriate filling of the gallbladder before 60 minutes indicating cystic duct patency excluding acute cholecystitis. However, there is a slightly reduced calculated gallbladder ejection fraction, which is at least in part artifactual given the persistent background hepatic counts projecting in the gallbladder fossa and delayed excretion of radiotracer into the biliary system, as such this study is equivocal in evaluation for chronic cholecystitis/biliary dyskinesia. Electronically Signed   By: Dahlia Bailiff MD   On: 01/11/2021 15:47   US Abdomen Limited  Result Date: 01/09/2021 CLINICAL DATA:  Left upper quadrant pain EXAM: ULTRASOUND SPLEEN COMPARISON:  None. FINDINGS: Spleen measures 13.3 x 8.1 x 11.9 cm with a measures splenic volume of 670 cubic cm. No focal splenic lesions are evident. No perisplenic fluid or adenopathy. IMPRESSION: Prominent spleen without focal splenic lesion evident. Electronically Signed   By: Lowella Grip III M.D.   On: 01/09/2021 07:56   [place] Art/Ven Flow Abd Pelv Doppler  Result Date: 01/04/2021 CLINICAL DATA:  Initial evaluation for acute left pelvic pain, evaluate for torsion. EXAM: TRANSABDOMINAL ULTRASOUND OF PELVIS DOPPLER ULTRASOUND OF OVARIES TECHNIQUE: Transabdominal ultrasound examination of the  pelvis was performed including evaluation of the uterus, ovaries, adnexal regions, and pelvic cul-de-sac. Color and duplex Doppler ultrasound was utilized to evaluate blood flow to the ovaries. COMPARISON:  Prior CT from earlier the same day. FINDINGS: Uterus Measurements: 6.1 x 2.9 x 4.0 cm = volume: 37.7 mL. Uterus is retroverted. No discrete fibroid or other mass. Endometrium Thickness: 5.0 mm.  No focal abnormality visualized. Right ovary Measurements: 3.1 x 1.5 x 2.2 cm = volume: 5.4 mL. Normal appearance/no adnexal mass. Left ovary Measurements: 3.4 x 2.2 x 2.7 cm = volume: 11.3 mL. Normal appearance/no adnexal mass. 1.8  cm dominant follicle noted. Pulsed Doppler evaluation demonstrates normal low-resistance arterial and venous waveforms in both ovaries. Other: Trace free fluid within the pelvis, likely physiologic. IMPRESSION: 1. 1.8 cm left ovarian dominant follicle, which could contribute to left-sided pelvic pain. Associated trace free fluid within the pelvis. 2. Otherwise unremarkable and normal pelvic ultrasound. No evidence for ovarian torsion. Electronically Signed   By: Jeannine Boga M.D.   On: 01/04/2021 19:58   CT BIOPSY  Result Date: 01/19/2021 INDICATION: DRESS syndrome. Retroperitoneal adenopathy of uncertain etiology. Please perform CT-guided bone marrow biopsy for tissue diagnostic purposes. EXAM: CT-GUIDED BONE MARROW BIOPSY AND ASPIRATION MEDICATIONS: Benadryl 25 mg IV ANESTHESIA/SEDATION: Versed 4 mg and Fentanyl 100 mcg was administered intravenously. Moderate Sedation Time: 10 minutes. The patient's level of consciousness and vital signs were monitored continuously by radiology nursing throughout the procedure under my direct supervision. COMPLICATIONS: None immediate. PROCEDURE: Informed consent was obtained from the patient following an explanation of the procedure, risks, benefits and alternatives. The patient understands, agrees and consents for the procedure. All questions were addressed. A time out was performed prior to the initiation of the procedure. The patient was positioned prone and non-contrast localization CT was performed of the pelvis to demonstrate the iliac marrow spaces. The operative site was prepped and draped in the usual sterile fashion. Under sterile conditions and local anesthesia, a 22 gauge spinal needle was utilized for procedural planning. Next, an 11 gauge coaxial bone biopsy needle was advanced into the left iliac marrow space. Needle position was confirmed with CT imaging. Initially, a bone marrow aspiration was performed. Next, a bone marrow biopsy was obtained with  the 11 gauge outer bone marrow device. The 11 gauge coaxial bone biopsy needle was re-advanced into a slightly different location within the left iliac marrow space, positioning was confirmed with CT imaging and an additional bone marrow biopsy was obtained. The needle was removed and superficial hemostasis was obtained with manual compression. A dressing was applied. The patient tolerated the procedure well without immediate post procedural complication. IMPRESSION: Successful CT guided left iliac bone marrow aspiration and core biopsy. Electronically Signed   By: Sandi Mariscal M.D.   On: 01/19/2021 12:45   DG CHEST PORT 1 VIEW  Result Date: 01/16/2021 CLINICAL DATA:  IBS and anxiety left flank pain EXAM: PORTABLE CHEST 1 VIEW COMPARISON:  01/13/2021 FINDINGS: The heart size and mediastinal contours are within normal limits. Both lungs are clear. The visualized skeletal structures are unremarkable. IMPRESSION: No active disease. Electronically Signed   By: Donavan Foil M.D.   On: 01/16/2021 19:44   DG CHEST PORT 1 VIEW  Result Date: 01/13/2021 CLINICAL DATA:  20 y/o female inpatient with chest pain that started on April 30th per patient Pt denies being a smoker Pt denies SOB Pt states pain in midline upper chest Hx of Asthma EXAM: PORTABLE CHEST 1 VIEW COMPARISON:  None. FINDINGS: Normal mediastinum and cardiac silhouette. Normal pulmonary vasculature. No evidence of effusion, infiltrate, or pneumothorax. No acute bony abnormality. IMPRESSION: Normal chest radiograph Electronically Signed   By: Suzy Bouchard M.D.   On: 01/13/2021 14:33   CT BONE MARROW BIOPSY & ASPIRATION  Result Date: 01/19/2021 INDICATION: DRESS syndrome. Retroperitoneal adenopathy of uncertain etiology. Please perform CT-guided bone marrow biopsy for tissue diagnostic purposes. EXAM: CT-GUIDED BONE MARROW BIOPSY AND ASPIRATION MEDICATIONS: Benadryl 25 mg IV ANESTHESIA/SEDATION: Versed 4 mg and Fentanyl 100 mcg was administered  intravenously. Moderate Sedation Time: 10 minutes. The patient's level of consciousness and vital signs were monitored continuously by radiology nursing throughout the procedure under my direct supervision. COMPLICATIONS: None immediate. PROCEDURE: Informed consent was obtained from the patient following an explanation of the procedure, risks, benefits and alternatives. The patient understands, agrees and consents for the procedure. All questions were addressed. A time out was performed prior to the initiation of the procedure. The patient was positioned prone and non-contrast localization CT was performed of the pelvis to demonstrate the iliac marrow spaces. The operative site was prepped and draped in the usual sterile fashion. Under sterile conditions and local anesthesia, a 22 gauge spinal needle was utilized for procedural planning. Next, an 11 gauge coaxial bone biopsy needle was advanced into the left iliac marrow space. Needle position was confirmed with CT imaging. Initially, a bone marrow aspiration was performed. Next, a bone marrow biopsy was obtained with the 11 gauge outer bone marrow device. The 11 gauge coaxial bone biopsy needle was re-advanced into a slightly different location within the left iliac marrow space, positioning was confirmed with CT imaging and an additional bone marrow biopsy was obtained. The needle was removed and superficial hemostasis was obtained with manual compression. A dressing was applied. The patient tolerated the procedure well without immediate post procedural complication. IMPRESSION: Successful CT guided left iliac bone marrow aspiration and core biopsy. Electronically Signed   By: Sandi Mariscal M.D.   On: 01/19/2021 12:45   ECHOCARDIOGRAM COMPLETE  Result Date: 01/10/2021    ECHOCARDIOGRAM REPORT   Patient Name:   [facility] Date of Exam: 01/10/2021 Medical Rec #:  [id number]     Height:       62.0 in Accession #:    [id number]    Weight:       135.0 lb Date of  Birth:  Jan 12, 2001     BSA:          1.618 m Patient Age:    20 years      BP:           104/84 mmHg Patient Gender: F             HR:           110 bpm. Exam Location:  Inpatient Procedure: 2D Echo, Cardiac Doppler and Color Doppler Indications:    Fever R50.9  History:        Patient has no prior history of Echocardiogram examinations.  Sonographer:    Jonelle Sidle Dance Referring Phys: [id number] CORNELIUS N VAN DAM  Sonographer Comments: Reading Cardiologist notified. IMPRESSIONS  1. Left ventricular ejection fraction, by estimation, is 60 to 65%. The left ventricle has normal function. The left ventricle has no regional wall motion abnormalities. Left ventricular diastolic parameters were normal.  2. Right ventricular systolic function is normal. The right ventricular size is normal.  3. The mitral valve is normal in structure. No evidence of mitral valve regurgitation. No  evidence of mitral stenosis.  4. Possible vegetation on tricuspid valve, 1.2 x 0.4cm mobile, septal leaflet. Consider TEE for further clarification. The tricuspid valve is abnormal.  5. The aortic valve is normal in structure. Aortic valve regurgitation is not visualized. No aortic stenosis is present.  6. The inferior vena cava is normal in size with greater than 50% respiratory variability, suggesting right atrial pressure of 3 mmHg. FINDINGS  Left Ventricle: Left ventricular ejection fraction, by estimation, is 60 to 65%. The left ventricle has normal function. The left ventricle has no regional wall motion abnormalities. The left ventricular internal cavity size was normal in size. There is  no left ventricular hypertrophy. Left ventricular diastolic parameters were normal. Right Ventricle: The right ventricular size is normal. No increase in right ventricular wall thickness. Right ventricular systolic function is normal. Left Atrium: Left atrial size was normal in size. Right Atrium: Right atrial size was normal in size. Pericardium: There is no  evidence of pericardial effusion. Mitral Valve: The mitral valve is normal in structure. No evidence of mitral valve regurgitation. No evidence of mitral valve stenosis. Tricuspid Valve: Possible vegetation on tricuspid valve, 1.2 x 0.4cm mobile, septal leaflet. Consider TEE for further clarification. The tricuspid valve is abnormal. Tricuspid valve regurgitation is not demonstrated. No evidence of tricuspid stenosis. Aortic Valve: The aortic valve is normal in structure. Aortic valve regurgitation is not visualized. No aortic stenosis is present. Pulmonic Valve: The pulmonic valve was normal in structure. Pulmonic valve regurgitation is not visualized. No evidence of pulmonic stenosis. Aorta: The aortic root is normal in size and structure. Venous: The inferior vena cava is normal in size with greater than 50% respiratory variability, suggesting right atrial pressure of 3 mmHg. IAS/Shunts: No atrial level shunt detected by color flow Doppler.  LEFT VENTRICLE PLAX 2D LVIDd:         3.60 cm  Diastology LVIDs:         2.70 cm  LV e' medial:    12.00 cm/s LV PW:         1.00 cm  LV E/e' medial:  8.8 LV IVS:        0.70 cm  LV e' lateral:   19.10 cm/s LVOT diam:     1.70 cm  LV E/e' lateral: 5.5 LV SV:         35 LV SV Index:   22 LVOT Area:     2.27 cm  RIGHT VENTRICLE             IVC RV Basal diam:  2.20 cm     IVC diam: 1.80 cm RV S prime:     12.30 cm/s TAPSE (M-mode): 2.5 cm LEFT ATRIUM             Index       RIGHT ATRIUM          Index LA diam:        3.10 cm 1.92 cm/m  RA Area:     8.27 cm LA Vol (A2C):   38.9 ml 24.05 ml/m RA Volume:   15.70 ml 9.71 ml/m LA Vol (A4C):   22.7 ml 14.03 ml/m LA Biplane Vol: 29.6 ml 18.30 ml/m  AORTIC VALVE LVOT Vmax:   109.00 cm/s LVOT Vmean:  66.600 cm/s LVOT VTI:    0.154 m  AORTA Ao Root diam: 2.40 cm Ao Asc diam:  2.40 cm MITRAL VALVE MV Area (PHT): 3.85 cm     SHUNTS MV Decel Time:  197 msec     Systemic VTI:  0.15 m MV E velocity: 106.00 cm/s  Systemic Diam: 1.70 cm  MV A velocity: 76.40 cm/s MV E/A ratio:  1.39 Candee Furbish MD Electronically signed by Candee Furbish MD Signature Date/Time: 01/10/2021/10:32:01 AM    Final    CT Renal Stone Study  Result Date: 01/04/2021 CLINICAL DATA:  Two days of left-sided flank pain, kidney stone suspected EXAM: CT ABDOMEN AND PELVIS WITHOUT CONTRAST TECHNIQUE: Multidetector CT imaging of the abdomen and pelvis was performed following the standard protocol without IV contrast. COMPARISON:  None. FINDINGS: Lower chest: No acute abnormality. Normal size heart. No significant pericardial effusion/thickening. Hepatobiliary: Unremarkable noncontrast appearance of the hepatic parenchyma. Gallbladder is grossly unremarkable. No biliary ductal dilation. Pancreas: Within normal limits. Spleen: Within normal limits Adrenals/Urinary Tract: Adrenal glands are unremarkable. No renal, ureteral or bladder calculus visualized. Kidneys are normal, contour deforming lesion, or hydronephrosis. Bladder is unremarkable for degree of distension. Stomach/Bowel: Stomach is within normal limits. Appendix is not discretely visualized however there is no pericecal inflammation. No evidence of bowel wall thickening, distention, or inflammatory changes. Vascular/Lymphatic: No significant vascular findings are present. No enlarged abdominal or pelvic lymph nodes. Reproductive: Uterus and right adnexa are unremarkable. 2.3 cm left adnexal cyst. No follow-up imaging recommended. Note: This recommendation does not apply to premenarchal patients and to those with increased risk (genetic, family history, elevated tumor markers or other high-risk factors) of ovarian cancer. Reference: JACR 2020 Feb; 17(2):248-254 Other: Trace pelvic free fluid, likely physiologic. Musculoskeletal: No acute or significant osseous findings. IMPRESSION: No acute abdominopelvic findings. Specifically, no evidence of obstructive uropathy. Electronically Signed   By: Dahlia Bailiff MD   On: 01/04/2021  17:29   ECHO TEE  Result Date: 01/12/2021    TRANSESOPHOGEAL ECHO REPORT   Patient Name:   [facility] Date of Exam: 01/12/2021 Medical Rec #:  [id number]     Height:       62.0 in Accession #:    [id number]    Weight:       135.0 lb Date of Birth:  2001/05/02     BSA:          1.618 m Patient Age:    20 years      BP:           119/81 mmHg Patient Gender: F             HR:           126 bpm. Exam Location:  Inpatient Procedure: Transesophageal Echo and Color Doppler Indications:     Bacteremia  History:         Patient has no prior history of Echocardiogram examinations,                  most recent 01/10/2021.  Sonographer:     Clayton Lefort RDCS (AE) Referring Phys:  [id number] Leanor Kail Diagnosing Phys: Candee Furbish MD PROCEDURE: After discussion of the risks and benefits of a TEE, an informed consent was obtained from the patient. The transesophogeal probe was passed without difficulty through the esophogus of the patient. Sedation performed by performing physician. Patients was under conscious sedation during this procedure. Anesthetic administered: 55mcg of Fentanyl, 4.$RemoveBefore'0mg'[id number]$  of Versed. Image quality was good. The patient's vital signs; including heart rate, blood pressure, and oxygen saturation; remained stable throughout the procedure. The patient developed no complications during the procedure. IMPRESSIONS  1. Left ventricular ejection fraction, by estimation, is 60 to  65%. The left ventricle has normal function. The left ventricle has no regional wall motion abnormalities.  2. Right ventricular systolic function is normal. The right ventricular size is normal.  3. No left atrial/left atrial appendage thrombus was detected.  4. The mitral valve is normal in structure. No evidence of mitral valve regurgitation. No evidence of mitral stenosis.  5. Normal tricuspid valve. No evidence of vegetation.  6. The aortic valve is normal in structure. Aortic valve regurgitation is not visualized. No aortic stenosis is  present.  7. The inferior vena cava is normal in size with greater than 50% respiratory variability, suggesting right atrial pressure of 3 mmHg. Conclusion(s)/Recommendation(s): Normal biventricular function without evidence of hemodynamically significant valvular heart disease. No evidence of vegetation/infective endocarditis on this transesophageal echocardiogram. FINDINGS  Left Ventricle: Left ventricular ejection fraction, by estimation, is 60 to 65%. The left ventricle has normal function. The left ventricle has no regional wall motion abnormalities. The left ventricular internal cavity size was normal in size. There is  no left ventricular hypertrophy. Right Ventricle: The right ventricular size is normal. No increase in right ventricular wall thickness. Right ventricular systolic function is normal. Left Atrium: Left atrial size was normal in size. No left atrial/left atrial appendage thrombus was detected. Right Atrium: Right atrial size was normal in size. Pericardium: There is no evidence of pericardial effusion. Mitral Valve: The mitral valve is normal in structure. No evidence of mitral valve regurgitation. No evidence of mitral valve stenosis. Tricuspid Valve: Normal tricuspid valve. No evidence of vegetation. The tricuspid valve is normal in structure. Tricuspid valve regurgitation is not demonstrated. No evidence of tricuspid stenosis. Aortic Valve: The aortic valve is normal in structure. Aortic valve regurgitation is not visualized. No aortic stenosis is present. Pulmonic Valve: The pulmonic valve was normal in structure. Pulmonic valve regurgitation is not visualized. No evidence of pulmonic stenosis. Aorta: The aortic root is normal in size and structure. Venous: The inferior vena cava is normal in size with greater than 50% respiratory variability, suggesting right atrial pressure of 3 mmHg. IAS/Shunts: No atrial level shunt detected by color flow Doppler. Candee Furbish MD Electronically signed by  Candee Furbish MD Signature Date/Time: 01/12/2021/3:52:02 PM    Final    VAS [place] UPPER EXTREMITY VENOUS DUPLEX  Result Date: 01/18/2021 UPPER VENOUS STUDY  Patient Name:  [facility]  Date of Exam:   01/18/2021 Medical Rec #: [id number]      Accession #:    [id number] Date of Birth: [birth year]      Patient Gender: F Patient Age:   20Y Exam Location:  [facility] Procedure:      VAS [place] UPPER EXTREMITY VENOUS DUPLEX Referring Phys: [id number] CALDWELL POWELL JR --------------------------------------------------------------------------------  Indications: Pain bilaterally s/p IV Limitations: Bandaging/IV RT forearm. Comparison Study: No prior studies. Performing Technologist: Darlin Coco RDMS,RVT  Examination Guidelines: A complete evaluation includes B-mode imaging, spectral Doppler, color Doppler, and power Doppler as needed of all accessible portions of each vessel. Bilateral testing is considered an integral part of a complete examination. Limited examinations for reoccurring indications may be performed as noted.  Right Findings: +----------+------------+---------+-----------+----------+-----------------+ RIGHT     CompressiblePhasicitySpontaneousProperties     Summary      +----------+------------+---------+-----------+----------+-----------------+ IJV           Full       Yes       Yes                                +----------+------------+---------+-----------+----------+-----------------+  Subclavian    Full       Yes       Yes                                +----------+------------+---------+-----------+----------+-----------------+ Axillary      Full       Yes       Yes                                +----------+------------+---------+-----------+----------+-----------------+ Brachial      Full                                                    +----------+------------+---------+-----------+----------+-----------------+ Radial        Full                                                     +----------+------------+---------+-----------+----------+-----------------+ Ulnar         Full                                                    +----------+------------+---------+-----------+----------+-----------------+ Cephalic    Partial      Yes       Yes              Age Indeterminate +----------+------------+---------+-----------+----------+-----------------+ Basilic       Full                                                    +----------+------------+---------+-----------+----------+-----------------+  Left Findings: +----------+------------+---------+-----------+----------+-----------------+ LEFT      CompressiblePhasicitySpontaneousProperties     Summary      +----------+------------+---------+-----------+----------+-----------------+ IJV           Full       Yes       Yes                                +----------+------------+---------+-----------+----------+-----------------+ Subclavian    Full       Yes       Yes                                +----------+------------+---------+-----------+----------+-----------------+ Axillary      Full       Yes       Yes                                +----------+------------+---------+-----------+----------+-----------------+ Brachial      Full                                                    +----------+------------+---------+-----------+----------+-----------------+  Radial        Full                                                    +----------+------------+---------+-----------+----------+-----------------+ Ulnar         Full                                                    +----------+------------+---------+-----------+----------+-----------------+ Cephalic      None       No        No               Age Indeterminate +----------+------------+---------+-----------+----------+-----------------+ Basilic       Full                                                     +----------+------------+---------+-----------+----------+-----------------+  Summary:  Right: No evidence of deep vein thrombosis in the upper extremity. 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  Left: No evidence of deep vein thrombosis in the upper extremity. Findings consistent with age indeterminate superficial vein thrombosis involving the left cephalic vein.  *See table(s) above for measurements and observations.  Diagnosing physician: Harold Barban MD Electronically signed by Harold Barban MD on 01/18/2021 at 103:16:28 PM.    Final    US Abdomen Limited RUQ (LIVER/GB)  Result Date: 01/10/2021 CLINICAL DATA:  Elevated liver enzymes with epigastric region pain EXAM: ULTRASOUND ABDOMEN LIMITED RIGHT UPPER QUADRANT COMPARISON:  CT abdomen and pelvis January 07, 2021 FINDINGS: Gallbladder: No evident gallstones. Gallbladder wall appears thickened. No pericholecystic fluid no sonographic Murphy sign noted by sonographer. Common bile duct: Diameter: 5 mm. No intrahepatic or extrahepatic biliary duct dilatation. Liver: No focal lesion identified. Within normal limits in parenchymal echogenicity. Portal vein is patent on color Doppler imaging with normal direction of blood flow towards the liver. Other: None. IMPRESSION: Thickening of the gallbladder wall noted. No gallstones evident. Question a degree of acalculus cholecystitis. This finding may warrant nuclear medicine hepatobiliary imaging study to assess cystic duct patency. Study otherwise unremarkable. Electronically Signed   By: Lowella Grip III M.D.   On: 01/10/2021 14:10       Subjective: No fever, vomiting.  Mild nausea today.  No confusion.  No abdominal pain.  Mild pain at hip from bone marrow biopsy.    Discharge Exam: Vitals:   01/20/21 2015 01/21/21 0431  BP: 117/73 113/64  Pulse: 85 92  Resp: 20 16  Temp: 98.1 F (36.7 C) 98.4 F (36.9 C)  SpO2:  97% 98%   Vitals:   01/20/21 0445 01/20/21 1303 01/20/21 2015 01/21/21 0431  BP: (!) 97/55 122/82 117/73 113/64  Pulse: 96 (!) 109 85 92  Resp: $Remo'18 18 20 16  'oBQTv$ Temp: 98.3 F (36.8 C) 97.7 F (36.5 C) 98.1 F (36.7 C) 98.4 F (36.9 C)  TempSrc: Oral Oral Oral Oral  SpO2: 97% 99% 97% 98%  Weight:      Height:        General:  Pt is alert, awake, not in acute distress.  Mild facial puffiness. Skin: Diffuse maculopapular rash is resolving. Cardiovascular: RRR, nl S1-S2, no murmurs appreciated.       Respiratory: Normal respiratory rate and rhythm.  CTAB without rales or wheezes. Abdominal: Abdomen soft and non-tender.     Neuro/Psych: Strength symmetric in upper and lower extremities.  Judgment and insight appear normal.   The results of significant diagnostics from this hospitalization (including imaging, microbiology, ancillary and laboratory) are listed below for reference.     Microbiology: No results found for this or any previous visit (from the past 240 hour(s)).   Labs: BNP (last 3 results) Recent Labs    01/13/21 0418 01/16/21 1825  BNP 54.8 26.8   Basic Metabolic Panel: Recent Labs  Lab 01/16/21 0514 01/17/21 0532 01/18/21 0512 01/19/21 0508 01/21/21 0644  NA 137 136 135 133* 132*  K 4.6 4.0 4.7 4.5 4.3  CL 104 102 102 102 96*  CO2 $Re'26 26 25 24 29  'RHk$ GLUCOSE 163* 156* 159* 117* 80  BUN $Re'15 15 16 19 15  'PNz$ CREATININE 0.49 <0.30* 0.49 0.50 0.57  CALCIUM 8.7* 8.6* 8.6* 8.3* 8.6*  MG 2.5* 2.4 2.5*  --   --   PHOS 4.3 4.7* 4.4  --   --    Liver Function Tests: Recent Labs  Lab 01/16/21 0514 01/17/21 0532 01/18/21 0512 01/19/21 0508 01/21/21 0644  AST $Re'25 19 23 29 'qst$ 43*  ALT 128* 101* 90* 83* 135*  ALKPHOS 98 85 74 67 60  BILITOT 0.2* 0.3 0.3 0.6 0.6  PROT 6.4* 6.3* 6.5 6.2* 6.8  ALBUMIN 3.5 3.3* 3.4* 3.2* 3.8   No results for input(s): LIPASE, AMYLASE in the last 168 hours. No results for input(s): AMMONIA in the last 168 hours. CBC: Recent Labs  Lab  01/17/21 0532 01/18/21 0512 01/19/21 0508 01/20/21 0805 01/21/21 0644  WBC 18.3* 14.8* 12.7* 12.8* 12.6*  NEUTROABS 11.7* 10.1* 7.8* 5.7 4.6  HGB 12.0 12.3 11.9* 12.9 13.1  HCT 35.7* 36.8 36.2 38.1 39.7  MCV 85.0 86.2 87.0 83.2 85.0  PLT 448* 474* 441* 425* 450*   Cardiac Enzymes: No results for input(s): CKTOTAL, CKMB, CKMBINDEX, TROPONINI in the last 168 hours. BNP: Invalid input(s): POCBNP CBG: Recent Labs  Lab 01/20/21 0801 01/20/21 1130 01/20/21 1649 01/20/21 2017 01/21/21 0747  GLUCAP 80 185* 207* 183* 102*   D-Dimer No results for input(s): DDIMER in the last 72 hours. Hgb A1c No results for input(s): HGBA1C in the last 72 hours. Lipid Profile No results for input(s): CHOL, HDL, LDLCALC, TRIG, CHOLHDL, LDLDIRECT in the last 72 hours. Thyroid function studies No results for input(s): TSH, T4TOTAL, T3FREE, THYROIDAB in the last 72 hours.  Invalid input(s): FREET3 Anemia work up No results for input(s): VITAMINB12, FOLATE, FERRITIN, TIBC, IRON, RETICCTPCT in the last 72 hours. Urinalysis    Component Value Date/Time   COLORURINE YELLOW 01/07/2021 0932   APPEARANCEUR CLEAR 01/07/2021 0932   LABSPEC 1.045 (H) 01/07/2021 0932   PHURINE 6.0 01/07/2021 0932   GLUCOSEU NEGATIVE 01/07/2021 0932   HGBUR LARGE (A) 01/07/2021 0932   BILIRUBINUR NEGATIVE 01/07/2021 0932   KETONESUR 80 (A) 01/07/2021 0932   PROTEINUR 30 (A) 01/07/2021 0932   UROBILINOGEN 0.2 11/22/2020 0848   NITRITE NEGATIVE 01/07/2021 0932   LEUKOCYTESUR NEGATIVE 01/07/2021 0932   Sepsis Labs Invalid input(s): PROCALCITONIN,  WBC,  LACTICIDVEN Microbiology No results found for this or any previous visit (from the past 240 hour(s)).   Time  coordinating discharge: 25 minutes The [place] controlled substances registry was reviewed for this patient    30 Day Unplanned Readmission Risk Score   Flowsheet Row ED to Hosp-Admission (Discharged) from 01/07/2021 in [facility]  30 Day Unplanned Readmission Risk Score (%) 16.53 Filed at 01/21/2021 0800     This score is the patient's risk of an unplanned readmission within 30 days of being discharged (0 -100%). The score is based on dignosis, age, lab data, medications, orders, and past utilization.   Low:  0-14.9   Medium: 15-21.9   High: 22-29.9   Extreme: 30 and above           SIGNED:   Edwin Dada, MD  [facility] 01/21/2021, 5:12 PM

## 2021-01-23 LAB — SURGICAL PATHOLOGY

## 2021-01-25 LAB — HISTOPLASMA ANTIGEN, URINE: Histoplasma Antigen, urine: <0.5 (ref <0.5)

## 2021-01-31 ENCOUNTER — Encounter (HOSPITAL_COMMUNITY): Payer: Self-pay | Admitting: Hematology & Oncology

## 2021-02-08 ENCOUNTER — Other Ambulatory Visit: Payer: Self-pay

## 2021-02-08 ENCOUNTER — Ambulatory Visit (HOSPITAL_COMMUNITY)
Admission: RE | Admit: 2021-02-08 | Discharge: 2021-02-08 | Disposition: A | Discharge status: Home / Self Care | Payer: Medicaid Other | Source: Ambulatory Visit | Attending: Internal Medicine | Admitting: Internal Medicine

## 2021-02-08 ENCOUNTER — Encounter (HOSPITAL_COMMUNITY): Payer: Self-pay

## 2021-02-08 VITALS — BP 106/73 | HR 104 | Temp 98.2°F | Resp 18

## 2021-02-08 DIAGNOSIS — B349 Viral infection, unspecified: Secondary | ICD-10-CM | POA: Diagnosis not present

## 2021-02-08 DIAGNOSIS — Z20822 Contact with and (suspected) exposure to covid-19: Secondary | ICD-10-CM | POA: Diagnosis not present

## 2021-02-08 DIAGNOSIS — R059 Cough, unspecified: Secondary | ICD-10-CM | POA: Insufficient documentation

## 2021-02-08 DIAGNOSIS — R0981 Nasal congestion: Secondary | ICD-10-CM | POA: Diagnosis not present

## 2021-02-08 HISTORY — DX: Migraine, unspecified, not intractable, without status migrainosus: G43.909

## 2021-02-08 LAB — SARS CORONAVIRUS 2 (TAT 6-24 HRS): SARS Coronavirus 2: NEGATIVE

## 2021-02-08 NOTE — ED Triage Notes (Signed)
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   Pt began having congestion and cough starting yesterday.   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

## 2021-02-08 NOTE — Discharge Instructions (Addendum)
Increase oral fluid intake Saline nasal spray Use Flonase and cetirizine on a regular basis Tylenol or Motrin as needed for fever and/or body aches Return to urgent care if symptoms worsen We will call you recommendations if labs are abnormal.

## 2021-02-09 LAB — SURGICAL PATHOLOGY

## 2021-02-09 NOTE — ED Provider Notes (Signed)
[facility]    CSN: [id number] Arrival date & time: 02/08/21  0853      History   Chief Complaint Chief Complaint  Patient presents with  . Cough  . Nasal Congestion    HPI Alexandra Young is a 20 y.o. female comes to the urgent care with runny nose, nasal congestion, cough and subjective fever of 1 day duration.  Patient is a Surveyor, minerals.  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  She denies any shortness of breath.  She has a cough which sounds wet but she denies production of sputum.  She is vaccinated against COVID-19 virus.  She has not received a booster yet because she is currently on a tapering dose of prednisone.  She is currently on a tapering dose of prednisone.  No generalized body aches.  No fever or chills.   HPI  Past Medical History:  Diagnosis Date  . Anxiety   . Asthma   . Depression   . DRESS syndrome 01/12/2021  . IBS (irritable bowel syndrome)   . Migraine    per pt    Patient Active Problem List   Diagnosis Date Noted  . DRESS syndrome 01/12/2021  . Bipolar affective disorder, current episode mixed ([place])   . Attention deficit disorder   . Elevated LFTs   . Epigastric pain   . Left flank pain   . Rash   . Endocarditis of tricuspid valve   . Transaminitis   . Intractable pain 01/08/2021  . Pelvic pain 01/08/2021  . Fever 01/08/2021  . Splenomegaly 01/08/2021  . Intractable nausea and vomiting 01/07/2021    Past Surgical History:  Procedure Laterality Date  . BONE MARROW BIOPSY    . TEE WITHOUT CARDIOVERSION N/A 01/12/2021   Procedure: TRANSESOPHAGEAL ECHOCARDIOGRAM (TEE);  Surgeon: Jerline Pain, MD;  Location: [facility] ENDOSCOPY;  Service: Cardiovascular;  Laterality: N/A;    OB History   No obstetric history on file.      Home Medications    Prior to Admission medications   Medication Sig Start Date End Date Taking? Authorizing Provider  albuterol (VENTOLIN HFA) 108 (90 Base) MCG/ACT  inhaler Inhale 2 puffs into the lungs every 6 (six) hours as needed for wheezing or shortness of breath. 11/11/20  Yes [provider]  busPIRone (BUSPAR) 10 MG tablet Take 10 mg by mouth 2 (two) times daily.   Yes [provider]  cetirizine (ZYRTEC) 10 MG tablet Take 10 mg by mouth 2 (two) times daily.   Yes [provider]  etonogestrel (NEXPLANON) 68 MG IMPL implant 68 mg by Subdermal route continuous.   Yes [provider]  fluticasone (FLONASE) 50 MCG/ACT nasal spray Place 2 sprays into both nostrils daily as needed for allergies.   Yes [provider]  Galcanezumab-gnlm (EMGALITY) 120 MG/ML SOAJ Inject 120 mg into the skin every 30 (thirty) days. 12/30/20  Yes [provider]  hydrOXYzine (ATARAX/VISTARIL) 25 MG tablet Take 1 tablet (25 mg total) by mouth 3 (three) times daily as needed for itching. 01/21/21  Yes Danford, Suann Larry, MD  linaclotide (LINZESS) 145 MCG CAPS capsule Take 145 mcg by mouth daily as needed (constipation).   Yes [provider]  loperamide (IMODIUM) 2 MG capsule Take 2 mg by mouth daily as needed for diarrhea or loose stools. 10/06/20  Yes [provider]  montelukast (SINGULAIR) 10 MG tablet Take 10 mg by mouth at  bedtime. 11/06/19  Yes [provider]  omeprazole (PRILOSEC) 40 MG capsule Take 40 mg by mouth daily.   Yes [provider]  ondansetron (ZOFRAN) 4 MG tablet Take 1 tablet (4 mg total) by mouth every 8 (eight) hours as needed for nausea or vomiting. 01/21/21  Yes Danford, Suann Larry, MD  polyethylene glycol (MIRALAX / GLYCOLAX) 17 g packet Take 17 g by mouth daily as needed for mild constipation.   Yes [provider]  predniSONE (DELTASONE) 10 MG tablet Take prednisone 60 mg (6 tabs) daily for 1 week then 50 mg (5 tabs) for 1 week then 40 mg (4 tabs) for 1 week then 30 mg (3 tabs) for 1 week then 20 mg for 1 week then 10 mg for 1 week 01/21/21  Yes Danford,  Suann Larry, MD  traZODone (DESYREL) 50 MG tablet Take 50 mg by mouth at bedtime as needed for sleep. 12/12/20 03/12/21 Yes [provider]  triamcinolone cream (KENALOG) 0.1 % Apply 1 application topically 2 (two) times daily. Apply to affected areas from neck down 01/21/21  Yes Danford, Suann Larry, MD  Vitamin D, Ergocalciferol, (DRISDOL) 1.25 MG (50000 UNIT) CAPS capsule Take 50,000 Units by mouth every 7 (seven) days.   Yes [provider]  butalbital-acetaminophen-caffeine (FIORICET) 50-325-40 MG tablet Take 1 tablet by mouth every 6 (six) hours as needed for migraine. 07/02/19   [provider]  HYDROcodone-acetaminophen (NORCO/VICODIN) 5-325 MG tablet Take 1 tablet by mouth every 8 (eight) hours as needed for moderate pain.    [provider]  medroxyPROGESTERone (DEPO-PROVERA) 150 MG/ML injection Inject 150 mg into the muscle every 3 (three) months.  05/20/20  [provider]    Family History Family History  Problem Relation Age of Onset  . Asthma Mother   . Thyroid disease Mother   . Cancer Mother   . Diabetes Mother   . Diabetes Father   . Migraines Father   . Depression Sister     Social History Social History   Tobacco Use  . Smoking status: Never Smoker  . Smokeless tobacco: Never Used  Vaping Use  . Vaping Use: Never used  Substance Use Topics  . Alcohol use: Never  . Drug use: Yes    Types: Marijuana    Comment: smokes once weekly per pt     Allergies   Lamictal [lamotrigine]   Review of Systems Review of Systems  Constitutional: Positive for fatigue and fever. Negative for chills.  HENT: Positive for congestion and rhinorrhea. Negative for postnasal drip and sore throat.   Respiratory: Positive for cough. Negative for shortness of breath and wheezing.   Gastrointestinal: Negative.   Musculoskeletal: Negative.  Negative for arthralgias and myalgias.  Neurological: Negative.  Negative for headaches.      Physical Exam Triage Vital Signs ED Triage Vitals  Enc Vitals Group     BP 02/08/21 0930 106/73     Pulse Rate 02/08/21 0930 (!) 104     Resp 02/08/21 0930 18     Temp 02/08/21 0930 98.2 F (36.8 C)     Temp src --      SpO2 02/08/21 0930 98 %     Weight --      Height --      Head Circumference --      Peak Flow --      Pain Score 02/08/21 0921 0     Pain Loc --      Pain Edu? --  Excl. in GC? --    No data found.  Updated Vital Signs BP 106/73   Pulse (!) 104   Temp 98.2 F (36.8 C)   Resp 18   LMP 01/06/2021 Comment: pt reports typically periods are irregular  SpO2 98%   Visual Acuity Right Eye Distance:   Left Eye Distance:   Bilateral Distance:    Right Eye Near:   Left Eye Near:    Bilateral Near:     Physical Exam Vitals and nursing note reviewed.  HENT:     Right Ear: Tympanic membrane normal.     Left Ear: Tympanic membrane normal.  Cardiovascular:     Rate and Rhythm: Normal rate and regular rhythm.     Pulses: Normal pulses.     Heart sounds: Normal heart sounds.  Pulmonary:     Effort: Pulmonary effort is normal.     Breath sounds: Normal breath sounds.  Musculoskeletal:        General: Normal range of motion.  Neurological:     Mental Status: She is alert.      UC Treatments / Results  Labs (all labs ordered are listed, but only abnormal results are displayed) Labs Reviewed  SARS CORONAVIRUS 2 (TAT 6-24 HRS)    EKG   Radiology No results found.  Procedures Procedures (including critical care time)  Medications Ordered in UC Medications - No data to display  Initial Impression / Assessment and Plan / UC Course  I have reviewed the triage vital signs and the nursing notes.  Pertinent labs & imaging results that were available during my care of the patient were reviewed by me and considered in my medical decision making (see chart for details).     1.  Acute viral syndrome: Increase oral fluid  intake COVID-19 PCR test has been sent Patient is advised to quarantine until COVID-19 test results available Continue to use cetirizine and Flonase on a consistent basis Tylenol or Motrin as needed for fever and/body aches We will call patient with recommendations if labs are abnormal Return precautions given to the patient. Final Clinical Impressions(s) / UC Diagnoses   Final diagnoses:  Acute viral syndrome     Discharge Instructions     Increase oral fluid intake Saline nasal spray Use Flonase and cetirizine on a regular basis Tylenol or Motrin as needed for fever and/or body aches Return to urgent care if symptoms worsen We will call you recommendations if labs are abnormal.   ED Prescriptions    None     PDMP not reviewed this encounter.   Chase Picket, MD 02/09/21 [phone]

## 2021-05-19 ENCOUNTER — Encounter (HOSPITAL_COMMUNITY): Payer: Self-pay | Admitting: Emergency Medicine

## 2021-05-19 ENCOUNTER — Emergency Department (HOSPITAL_COMMUNITY)
Admission: EM | Admit: 2021-05-19 | Discharge: 2021-05-19 | Disposition: A | Discharge status: Home / Self Care | Payer: Medicaid Other | Attending: Emergency Medicine | Admitting: Emergency Medicine

## 2021-05-19 ENCOUNTER — Other Ambulatory Visit: Payer: Self-pay

## 2021-05-19 DIAGNOSIS — Z20822 Contact with and (suspected) exposure to covid-19: Secondary | ICD-10-CM | POA: Diagnosis not present

## 2021-05-19 DIAGNOSIS — R509 Fever, unspecified: Secondary | ICD-10-CM | POA: Diagnosis not present

## 2021-05-19 DIAGNOSIS — J029 Acute pharyngitis, unspecified: Secondary | ICD-10-CM

## 2021-05-19 DIAGNOSIS — Z79899 Other long term (current) drug therapy: Secondary | ICD-10-CM | POA: Insufficient documentation

## 2021-05-19 DIAGNOSIS — R112 Nausea with vomiting, unspecified: Secondary | ICD-10-CM | POA: Diagnosis not present

## 2021-05-19 LAB — BASIC METABOLIC PANEL
Anion gap: 17 — ABNORMAL HIGH (ref 5–15)
BUN: 7 mg/dL (ref 6–20)
CO2: 16 mmol/L — ABNORMAL LOW (ref 22–32)
Calcium: 9.9 mg/dL (ref 8.9–10.3)
Chloride: 106 mmol/L (ref 98–111)
Creatinine, Ser: 0.7 mg/dL (ref 0.44–1.00)
GFR, Estimated: >60 mL/min (ref >60)
Glucose, Bld: 81 mg/dL (ref 70–99)
Potassium: 3.6 mmol/L (ref 3.5–5.1)
Sodium: 139 mmol/L (ref 135–145)

## 2021-05-19 LAB — RESP PANEL BY RT-PCR (FLU A&B, COVID) ARPGX2
Influenza A by PCR: NEGATIVE
Influenza B by PCR: NEGATIVE
SARS Coronavirus 2 by RT PCR: NEGATIVE

## 2021-05-19 LAB — CBC WITH DIFFERENTIAL/PLATELET
Abs Immature Granulocytes: 0.08 10*3/uL — ABNORMAL HIGH (ref 0.00–0.07)
Basophils Absolute: 0.1 10*3/uL (ref 0.0–0.1)
Basophils Relative: 0 %
Eosinophils Absolute: 0.5 10*3/uL (ref 0.0–0.5)
Eosinophils Relative: 3 %
HCT: 40.6 % (ref 36.0–46.0)
Hemoglobin: 13.2 g/dL (ref 12.0–15.0)
Immature Granulocytes: 1 %
Lymphocytes Relative: 13 %
Lymphs Abs: 1.8 10*3/uL (ref 0.7–4.0)
MCH: 27.6 pg (ref 26.0–34.0)
MCHC: 32.5 g/dL (ref 30.0–36.0)
MCV: 84.8 fL (ref 80.0–100.0)
Monocytes Absolute: 1.2 10*3/uL — ABNORMAL HIGH (ref 0.1–1.0)
Monocytes Relative: 9 %
Neutro Abs: 10.6 10*3/uL — ABNORMAL HIGH (ref 1.7–7.7)
Neutrophils Relative %: 74 %
Platelets: 346 10*3/uL (ref 150–400)
RBC: 4.79 MIL/uL (ref 3.87–5.11)
RDW: 13.2 % (ref 11.5–15.5)
WBC: 14.3 10*3/uL — ABNORMAL HIGH (ref 4.0–10.5)
nRBC: 0 % (ref 0.0–0.2)

## 2021-05-19 LAB — I-STAT BETA HCG BLOOD, ED (MC, WL, AP ONLY): I-stat hCG, quantitative: <5 m[IU]/mL (ref <5)

## 2021-05-19 LAB — GROUP A STREP BY PCR: Group A Strep by PCR: NOT DETECTED

## 2021-05-19 MED ORDER — ACETAMINOPHEN 500 MG PO TABS
1000.0000 mg | ORAL_TABLET | Freq: Once | ORAL | Status: AC
  Administered 2021-05-19: 1000 mg via ORAL
  Filled 2021-05-19: qty 2

## 2021-05-19 MED ORDER — ONDANSETRON HCL 4 MG/2ML IJ SOLN
4.0000 mg | Freq: Once | INTRAMUSCULAR | Status: AC
  Administered 2021-05-19: 4 mg via INTRAVENOUS
  Filled 2021-05-19: qty 2

## 2021-05-19 MED ORDER — ONDANSETRON 4 MG PO TBDP: 4.0000 mg | ORAL_TABLET | Freq: Three times a day (TID) | ORAL | 0 refills | Status: DC | PRN

## 2021-05-19 MED ORDER — SODIUM CHLORIDE 0.9 % IV BOLUS
1000.0000 mL | Freq: Once | INTRAVENOUS | Status: AC
  Administered 2021-05-19: 1000 mL via INTRAVENOUS

## 2021-05-19 NOTE — Discharge Instructions (Addendum)
Please take printed prescription to pharmacy of your choosing and have it filled. Take as needed for nausea.   Drink plenty of fluids to stay hydrated. Rest as much as possible. Continue Tylenol and Ibuprofen as needed for pain.   Follow up with your PCP regarding ED visit today  Return to the ED IMMEDIATELY for any worsening symptoms including inability to swallow liquids including your own saliva, persistent vomiting despite nausea medications, worsening sore throat, or any other new/concerning symptoms.

## 2021-05-19 NOTE — ED Triage Notes (Addendum)
Patient here from home reporting sore throat after testing positive for strep 3 days ago. Augmentin with no relief. Reports ongoing n/v with fever. Tylenol ibuprofen no relief.

## 2021-05-19 NOTE — ED Provider Notes (Signed)
[facility] DEPT Provider Note   CSN: [id number] Arrival date & time: 05/19/21  1922     History Chief Complaint  Patient presents with   Sore Throat   Emesis    Alexandra Young is a 20 y.o. female who presents to the ED today with complaint of ongoing sore throat for the past 3 days.  Reports she started having a sore throat with exudate 3 days ago.  She had a telemedicine visit and was diagnosed with strep throat virtually despite no strep test being done.  She was prescribed Augmentin x10 days.  She states she has been taking it without improvement.  She states she is now having nausea and vomiting.  She states that her food and liquids are going down however several hours later she begins having vomiting.  She is tolerating her own secretions without difficulty.  She continues to have fever as well.  She last took ibuprofen about 6 hours ago and Tylenol earlier this morning.  She feels dehydrated.  She states she has some abdominal discomfort however she believes this is due to excessive vomiting.  She denies any rash after taking the Augmentin.  No oher complaints at this time.  The history is provided by the patient and medical records.      Past Medical History:  Diagnosis Date   Anxiety    Asthma    Depression    DRESS syndrome 01/12/2021   IBS (irritable bowel syndrome)    Migraine    per pt    Patient Active Problem List   Diagnosis Date Noted   DRESS syndrome 01/12/2021   Bipolar affective disorder, current episode mixed ([place])    Attention deficit disorder    Elevated LFTs    Epigastric pain    Left flank pain    Rash    Endocarditis of tricuspid valve    Transaminitis    Intractable pain 01/08/2021   Pelvic pain 01/08/2021   Fever 01/08/2021   Splenomegaly 01/08/2021   Intractable nausea and vomiting 01/07/2021    Past Surgical History:  Procedure Laterality Date   BONE MARROW BIOPSY     TEE WITHOUT CARDIOVERSION N/A 01/12/2021    Procedure: TRANSESOPHAGEAL ECHOCARDIOGRAM (TEE);  Surgeon: Jerline Pain, MD;  Location: [facility] ENDOSCOPY;  Service: Cardiovascular;  Laterality: N/A;     OB History   No obstetric history on file.     Family History  Problem Relation Age of Onset   Asthma Mother    Thyroid disease Mother    Cancer Mother    Diabetes Mother    Diabetes Father    Migraines Father    Depression Sister     Social History   Tobacco Use   Smoking status: Never   Smokeless tobacco: Never  Vaping Use   Vaping Use: Never used  Substance Use Topics   Alcohol use: Never   Drug use: Yes    Types: Marijuana    Comment: smokes once weekly per pt    Home Medications Prior to Admission medications   Medication Sig Start Date End Date Taking? Authorizing Provider  ondansetron (ZOFRAN ODT) 4 MG disintegrating tablet Take 1 tablet (4 mg total) by mouth every 8 (eight) hours as needed for nausea or vomiting. 05/19/21  Yes Haik Mahoney, PA-C  albuterol (VENTOLIN HFA) 108 (90 Base) MCG/ACT inhaler Inhale 2 puffs into the lungs every 6 (six) hours as needed for wheezing or shortness of breath. 11/11/20   [provider]  busPIRone (BUSPAR) 10 MG tablet Take 10 mg by mouth 2 (two) times daily.    [provider]  butalbital-acetaminophen-caffeine (FIORICET) 50-325-40 MG tablet Take 1 tablet by mouth every 6 (six) hours as needed for migraine. 07/02/19   [provider]  cetirizine (ZYRTEC) 10 MG tablet Take 10 mg by mouth 2 (two) times daily.    [provider]  etonogestrel (NEXPLANON) 68 MG IMPL implant 68 mg by Subdermal route continuous.    [provider]  fluticasone (FLONASE) 50 MCG/ACT nasal spray Place 2 sprays into both nostrils daily as needed for allergies.    [provider]  Galcanezumab-gnlm (EMGALITY) 120 MG/ML SOAJ Inject 120 mg into the skin every 30 (thirty) days. 12/30/20   [provider]  HYDROcodone-acetaminophen (NORCO/VICODIN)  5-325 MG tablet Take 1 tablet by mouth every 8 (eight) hours as needed for moderate pain.    [provider]  hydrOXYzine (ATARAX/VISTARIL) 25 MG tablet Take 1 tablet (25 mg total) by mouth 3 (three) times daily as needed for itching. 01/21/21   Danford, Suann Larry, MD  linaclotide (LINZESS) 145 MCG CAPS capsule Take 145 mcg by mouth daily as needed (constipation).    [provider]  loperamide (IMODIUM) 2 MG capsule Take 2 mg by mouth daily as needed for diarrhea or loose stools. 10/06/20   [provider]  montelukast (SINGULAIR) 10 MG tablet Take 10 mg by mouth at bedtime. 11/06/19   [provider]  omeprazole (PRILOSEC) 40 MG capsule Take 40 mg by mouth daily.    [provider]  ondansetron (ZOFRAN) 4 MG tablet Take 1 tablet (4 mg total) by mouth every 8 (eight) hours as needed for nausea or vomiting. 01/21/21   Danford, Suann Larry, MD  polyethylene glycol (MIRALAX / GLYCOLAX) 17 g packet Take 17 g by mouth daily as needed for mild constipation.    [provider]  predniSONE (DELTASONE) 10 MG tablet Take prednisone 60 mg (6 tabs) daily for 1 week then 50 mg (5 tabs) for 1 week then 40 mg (4 tabs) for 1 week then 30 mg (3 tabs) for 1 week then 20 mg for 1 week then 10 mg for 1 week 01/21/21   Edwin Dada, MD  traZODone (DESYREL) 50 MG tablet Take 50 mg by mouth at bedtime as needed for sleep. 12/12/20 03/12/21  [provider]  triamcinolone cream (KENALOG) 0.1 % Apply 1 application topically 2 (two) times daily. Apply to affected areas from neck down 01/21/21   Danford, Suann Larry, MD  Vitamin D, Ergocalciferol, (DRISDOL) 1.25 MG (50000 UNIT) CAPS capsule Take 50,000 Units by mouth every 7 (seven) days.    [provider]  medroxyPROGESTERone (DEPO-PROVERA) 150 MG/ML injection Inject 150 mg into the muscle every 3 (three) months.  05/20/20  [provider]    Allergies    Lamictal [lamotrigine]  Review  of Systems   Review of Systems  Constitutional:  Positive for fever.  HENT:  Positive for sore throat. Negative for trouble swallowing and voice change.   Gastrointestinal:  Positive for abdominal pain, nausea and vomiting.  Skin:  Negative for rash.  All other systems reviewed and are negative.  Physical Exam Updated Vital Signs BP 128/85 (BP Location: Left Arm)   Pulse (!) 124   Temp 100.1 F (37.8 C) (Oral)   Resp 18   Ht $R'5\' 2"'Og$  (1.575 m)   Wt 61.4 kg   SpO2 100%   BMI  24.75 kg/m   Physical Exam Vitals and nursing note reviewed.  Constitutional:      Appearance: She is not ill-appearing or diaphoretic.  HENT:     Head: Normocephalic and atraumatic.     Mouth/Throat:     Mouth: Mucous membranes are moist.     Pharynx: Uvula midline. Pharyngeal swelling, oropharyngeal exudate and posterior oropharyngeal erythema present.     Tonsils: 1+ on the right. 1+ on the left.  Eyes:     Conjunctiva/sclera: Conjunctivae normal.  Cardiovascular:     Rate and Rhythm: Regular rhythm. Tachycardia present.  Pulmonary:     Effort: Pulmonary effort is normal.     Breath sounds: Normal breath sounds. No wheezing or rales.  Abdominal:     Palpations: Abdomen is soft.     Tenderness: There is no abdominal tenderness. There is no guarding or rebound.  Musculoskeletal:     Cervical back: Neck supple.  Skin:    General: Skin is warm and dry.  Neurological:     Mental Status: She is alert.    ED Results / Procedures / Treatments   Labs (all labs ordered are listed, but only abnormal results are displayed) Labs Reviewed  BASIC METABOLIC PANEL - Abnormal; Notable for the following components:      Result Value   CO2 16 (*)    Anion gap 17 (*)    All other components within normal limits  CBC WITH DIFFERENTIAL/PLATELET - Abnormal; Notable for the following components:   WBC 14.3 (*)    Neutro Abs 10.6 (*)    Monocytes Absolute 1.2 (*)    Abs Immature Granulocytes 0.08 (*)    All  other components within normal limits  GROUP A STREP BY PCR  RESP PANEL BY RT-PCR (FLU A&B, COVID) [id number]  I-STAT BETA HCG BLOOD, ED ([facility], [facility], [facility] ONLY)    EKG None  Radiology No results found.  Procedures Procedures   Medications Ordered in ED Medications  acetaminophen (TYLENOL) tablet 1,000 mg (1,000 mg Oral Given 05/19/21 2143)  sodium chloride 0.9 % bolus 1,000 mL (0 mLs Intravenous Stopped 05/19/21 2200)  ondansetron (ZOFRAN) injection 4 mg (4 mg Intravenous Given 05/19/21 2037)    ED Course  I have reviewed the triage vital signs and the nursing notes.  Pertinent labs & imaging results that were available during my care of the patient were reviewed by me and considered in my medical decision making (see chart for details).    MDM Rules/Calculators/A&P                           20 year old female who presents to the ED today with ongoing symptoms of sore throat as well as nausea, vomiting, fever, slight abdominal pain for the past 3 days.  Currently on Augmentin for strep throat that was diagnosed virtually.  On arrival to the ED today patient is febrile 100.3.  She is tachycardic in the 120s, nontachypneic.  She is phonating normally.  Tolerating her own secretions.  She is noted to have bilateral tonsillar hypertrophy with exudate and erythema however her uvula is midline.  I have very low suspicion for PTA, retropharyngeal abscess. Do not feel pt requires imaging at this time. Given she was not active actually tested for strep throat we will repeat strep test at this time.  Given nausea and vomiting will also obtain COVID test.  She has no abdominal tenderness palpation on exam today and denies  any rash after starting Augmentin.  Given her symptoms have only been going on for 3 days I have lower suspicion for mono  Will provide Zofran and fluids.   CBC with leukocytosis 14,300 (slightly elevated compared to previous).   Hemoglobin  Date Value Ref Range Status  05/19/2021 13.2  12.0 - 15.0 g/dL Final  01/21/2021 13.1 12.0 - 15.0 g/dL Final  01/20/2021 12.9 12.0 - 15.0 g/dL Final  01/19/2021 11.9 (L) 12.0 - 15.0 g/dL Final   BMP with bicarb 16 and gap of 17; likely related to dehydration. Fluids running at this time. Glucose 81. No signs of DKA.  Strep negative COVID and flu negative  On reevaluation pt resting comfortably. HR has improved after fluids. She is tolerating PO without difficulty. Stable for discharge at this time. Will discharge with Zofran. Instructed to rest and drink plenty of fluids to stay hydrated. Advised to follow up with PCP and to return to the ED for any worsening symptoms.  This note was prepared using Dragon voice recognition software and may include unintentional dictation errors due to the inherent limitations of voice recognition software.  Final Clinical Impression(s) / ED Diagnoses Final diagnoses:  Viral pharyngitis    Rx / DC Orders ED Discharge Orders          Ordered    ondansetron (ZOFRAN ODT) 4 MG disintegrating tablet  Every 8 hours PRN        05/19/21 2211             Discharge Instructions      Please take printed prescription to pharmacy of your choosing and have it filled. Take as needed for nausea.   Drink plenty of fluids to stay hydrated. Rest as much as possible. Continue Tylenol and Ibuprofen as needed for pain.   Follow up with your PCP regarding ED visit today  Return to the ED IMMEDIATELY for any worsening symptoms including inability to swallow liquids including your own saliva, persistent vomiting despite nausea medications, worsening sore throat, or any other new/concerning symptoms.        Alexandra Maize, PA-C 05/19/21 2212    Lacretia Leigh, MD 05/23/21 1319

## 2021-07-04 ENCOUNTER — Other Ambulatory Visit: Payer: Self-pay

## 2021-07-04 ENCOUNTER — Encounter (HOSPITAL_COMMUNITY): Payer: Self-pay | Admitting: Emergency Medicine

## 2021-07-04 ENCOUNTER — Ambulatory Visit (HOSPITAL_COMMUNITY)
Admission: EM | Admit: 2021-07-04 | Discharge: 2021-07-04 | Disposition: A | Discharge status: Home / Self Care | Payer: Medicaid Other | Attending: Emergency Medicine | Admitting: Emergency Medicine

## 2021-07-04 DIAGNOSIS — J101 Influenza due to other identified influenza virus with other respiratory manifestations: Secondary | ICD-10-CM

## 2021-07-04 LAB — POC INFLUENZA A AND B ANTIGEN (URGENT CARE ONLY)
INFLUENZA A ANTIGEN, POC: POSITIVE — AB
INFLUENZA B ANTIGEN, POC: NEGATIVE

## 2021-07-04 MED ORDER — OSELTAMIVIR PHOSPHATE 75 MG PO CAPS: 75.0000 mg | ORAL_CAPSULE | Freq: Two times a day (BID) | ORAL | 0 refills | Status: DC

## 2021-07-04 MED ORDER — KETOROLAC TROMETHAMINE 30 MG/ML IJ SOLN
INTRAMUSCULAR | Status: AC
  Filled 2021-07-04: qty 1

## 2021-07-04 MED ORDER — ACETAMINOPHEN 325 MG PO TABS
ORAL_TABLET | ORAL | Status: AC
  Filled 2021-07-04: qty 2

## 2021-07-04 MED ORDER — ACETAMINOPHEN 325 MG PO TABS
650.0000 mg | ORAL_TABLET | Freq: Once | ORAL | Status: AC
  Administered 2021-07-04: 650 mg via ORAL

## 2021-07-04 MED ORDER — KETOROLAC TROMETHAMINE 30 MG/ML IJ SOLN
30.0000 mg | Freq: Once | INTRAMUSCULAR | Status: AC
  Administered 2021-07-04: 30 mg via INTRAMUSCULAR

## 2021-07-04 NOTE — Discharge Instructions (Addendum)
You have flu A. Please quarantine until you have no fever for 24 hours.   Take tamiflu twice a day for 5 days     You can take Tylenol and/or Ibuprofen as needed for fever reduction and pain relief.   For cough: honey 1/2 to 1 teaspoon (you can dilute the honey in water or another fluid).  You can also use guaifenesin and dextromethorphan for cough. You can use a humidifier for chest congestion and cough.  If you don't have a humidifier, you can sit in the bathroom with the hot shower running.      For sore throat: try warm salt water gargles, cepacol lozenges, throat spray, warm tea or water with lemon/honey, popsicles or ice, or OTC cold relief medicine for throat discomfort.   For congestion: take a daily anti-histamine like Zyrtec, Claritin, and a oral decongestant, such as pseudoephedrine.  You can also use Flonase 1-2 sprays in each nostril daily.   It is important to stay hydrated: drink plenty of fluids (water, gatorade/powerade/pedialyte, juices, or teas) to keep your throat moisturized and help further relieve irritation/discomfort.

## 2021-07-04 NOTE — ED Triage Notes (Signed)
Pt c/o congestion, headache/migraine since yesterday. 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

## 2021-07-06 NOTE — ED Provider Notes (Signed)
[place]    CSN: [id number] Arrival date & time: 07/04/21  1434      History   Chief Complaint Chief Complaint  Patient presents with  . Cough  . Nasal Congestion    HPI Alexandra Young is a 20 y.o. female.   Patient presents with constant headache, nasal congestion, fevers, diarrhea and cough for 2 days. 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  Poor appetite, tolerating fluids. History of migraines, anxiety, asthma, IBS. Denies chills, body aches, abdominal pain, vomiting, nausea, sob, wheezing.   Past Medical History:  Diagnosis Date  . Anxiety   . Asthma   . Depression   . DRESS syndrome 01/12/2021  . IBS (irritable bowel syndrome)   . Migraine    per pt    Patient Active Problem List   Diagnosis Date Noted  . DRESS syndrome 01/12/2021  . Bipolar affective disorder, current episode mixed ([place])   . Attention deficit disorder   . Elevated LFTs   . Epigastric pain   . Left flank pain   . Rash   . Endocarditis of tricuspid valve   . Transaminitis   . Intractable pain 01/08/2021  . Pelvic pain 01/08/2021  . Fever 01/08/2021  . Splenomegaly 01/08/2021  . Intractable nausea and vomiting 01/07/2021    Past Surgical History:  Procedure Laterality Date  . BONE MARROW BIOPSY    . TEE WITHOUT CARDIOVERSION N/A 01/12/2021   Procedure: TRANSESOPHAGEAL ECHOCARDIOGRAM (TEE);  Surgeon: Jerline Pain, MD;  Location: [facility] ENDOSCOPY;  Service: Cardiovascular;  Laterality: N/A;    OB History   No obstetric history on file.      Home Medications    Prior to Admission medications   Medication Sig Start Date End Date Taking? Authorizing Provider  oseltamivir (TAMIFLU) 75 MG capsule Take 1 capsule (75 mg total) by mouth every 12 (twelve) hours. 07/04/21  Yes ,  R, NP  albuterol (VENTOLIN HFA) 108 (90 Base) MCG/ACT inhaler Inhale 2 puffs into the lungs every 6 (six) hours as  needed for wheezing or shortness of breath. 11/11/20   [provider]  busPIRone (BUSPAR) 10 MG tablet Take 10 mg by mouth 2 (two) times daily.    [provider]  butalbital-acetaminophen-caffeine (FIORICET) 50-325-40 MG tablet Take 1 tablet by mouth every 6 (six) hours as needed for migraine. 07/02/19   [provider]  cetirizine (ZYRTEC) 10 MG tablet Take 10 mg by mouth 2 (two) times daily.    [provider]  etonogestrel (NEXPLANON) 68 MG IMPL implant 68 mg by Subdermal route continuous.    [provider]  fluticasone (FLONASE) 50 MCG/ACT nasal spray Place 2 sprays into both nostrils daily as needed for allergies.    [provider]  Galcanezumab-gnlm (EMGALITY) 120 MG/ML SOAJ Inject 120 mg into the skin every 30 (thirty) days. 12/30/20   [provider]  HYDROcodone-acetaminophen (NORCO/VICODIN) 5-325 MG tablet Take 1 tablet by mouth every 8 (eight) hours as needed for moderate pain.    [provider]  hydrOXYzine (ATARAX/VISTARIL) 25 MG tablet Take 1 tablet (25 mg total) by mouth 3 (three) times daily as needed for itching. 01/21/21   Danford, Suann Larry, MD  linaclotide (LINZESS) 145 MCG CAPS capsule Take 145 mcg by mouth daily as needed (constipation).    [provider]  loperamide (IMODIUM) 2 MG capsule Take 2 mg by mouth daily as needed for diarrhea or  loose stools. 10/06/20   [provider]  montelukast (SINGULAIR) 10 MG tablet Take 10 mg by mouth at bedtime. 11/06/19   [provider]  omeprazole (PRILOSEC) 40 MG capsule Take 40 mg by mouth daily.    [provider]  ondansetron (ZOFRAN ODT) 4 MG disintegrating tablet Take 1 tablet (4 mg total) by mouth every 8 (eight) hours as needed for nausea or vomiting. 05/19/21   Alroy Bailiff, Margaux, PA-C  ondansetron (ZOFRAN) 4 MG tablet Take 1 tablet (4 mg total) by mouth every 8 (eight) hours as needed for nausea or vomiting. 01/21/21   Danford,  Suann Larry, MD  polyethylene glycol (MIRALAX / GLYCOLAX) 17 g packet Take 17 g by mouth daily as needed for mild constipation.    [provider]  predniSONE (DELTASONE) 10 MG tablet Take prednisone 60 mg (6 tabs) daily for 1 week then 50 mg (5 tabs) for 1 week then 40 mg (4 tabs) for 1 week then 30 mg (3 tabs) for 1 week then 20 mg for 1 week then 10 mg for 1 week 01/21/21   Edwin Dada, MD  traZODone (DESYREL) 50 MG tablet Take 50 mg by mouth at bedtime as needed for sleep. 12/12/20 03/12/21  [provider]  triamcinolone cream (KENALOG) 0.1 % Apply 1 application topically 2 (two) times daily. Apply to affected areas from neck down 01/21/21   Danford, Suann Larry, MD  Vitamin D, Ergocalciferol, (DRISDOL) 1.25 MG (50000 UNIT) CAPS capsule Take 50,000 Units by mouth every 7 (seven) days.    [provider]  medroxyPROGESTERone (DEPO-PROVERA) 150 MG/ML injection Inject 150 mg into the muscle every 3 (three) months.  05/20/20  [provider]    Family History Family History  Problem Relation Age of Onset  . Asthma Mother   . Thyroid disease Mother   . Cancer Mother   . Diabetes Mother   . Diabetes Father   . Migraines Father   . Depression Sister     Social History Social History   Tobacco Use  . Smoking status: Never  . Smokeless tobacco: Never  Vaping Use  . Vaping Use: Never used  Substance Use Topics  . Alcohol use: Never  . Drug use: Yes    Types: Marijuana    Comment: smokes once weekly per pt     Allergies   Lamictal [lamotrigine]   Review of Systems Review of Systems Defer to HPI    Physical Exam Triage Vital Signs ED Triage Vitals  Enc Vitals Group     BP --      Pulse Rate 07/04/21 1535 (!) 109     Resp 07/04/21 1535 17     Temp 07/04/21 1535 (!) 101.1 F (38.4 C)     Temp Source 07/04/21 1535 Oral     SpO2 07/04/21 1535 100 %     Weight --      Height --      Head Circumference --      Peak Flow --       Pain Score 07/04/21 1603 5     Pain Loc --      Pain Edu? --      Excl. in [place]? --    No data found.  Updated Vital Signs Pulse (!) 109   Temp (!) 101.1 F (38.4 C) (Oral)   Resp 17   SpO2 100%   Visual Acuity Right Eye Distance:   Left Eye Distance:   Bilateral Distance:  Right Eye Near:   Left Eye Near:    Bilateral Near:     Physical Exam Constitutional:      Appearance: Normal appearance. She is normal weight.  HENT:     Head: Normocephalic.     Right Ear: Tympanic membrane, ear canal and external ear normal.     Left Ear: Tympanic membrane, ear canal and external ear normal.     Nose: Congestion and rhinorrhea present.     Mouth/Throat:     Mouth: Mucous membranes are moist.     Pharynx: Posterior oropharyngeal erythema present.  Eyes:     Extraocular Movements: Extraocular movements intact.  Cardiovascular:     Rate and Rhythm: Normal rate and regular rhythm.     Pulses: Normal pulses.     Heart sounds: Normal heart sounds.  Pulmonary:     Effort: Pulmonary effort is normal.     Breath sounds: Normal breath sounds.  Musculoskeletal:     Cervical back: Normal range of motion.  Skin:    General: Skin is warm and dry.  Neurological:     Mental Status: She is alert and oriented to person, place, and time. Mental status is at baseline.  Psychiatric:        Mood and Affect: Mood normal.        Behavior: Behavior normal.     UC Treatments / Results  Labs (all labs ordered are listed, but only abnormal results are displayed) Labs Reviewed  POC INFLUENZA A AND B ANTIGEN (URGENT CARE ONLY) - Abnormal; Notable for the following components:      Result Value   INFLUENZA A ANTIGEN, POC POSITIVE (*)    All other components within normal limits    EKG   Radiology No results found.  Procedures Procedures (including critical care time)  Medications Ordered in UC Medications  acetaminophen (TYLENOL) tablet 650 mg (650 mg Oral Given 07/04/21 1556)   ketorolac (TORADOL) 30 MG/ML injection 30 mg (30 mg Intramuscular Given 07/04/21 1557)    Initial Impression / Assessment and Plan / UC Course  I have reviewed the triage vital signs and the nursing notes.  Pertinent labs & imaging results that were available during my care of the patient were reviewed by me and considered in my medical decision making (see chart for details).  Influenza A  Flu test positive Tamiflu 75 mg  bid for 5 days Otc medications for remaining symptoms  Follow up as needed  Final Clinical Impressions(s) / UC Diagnoses   Final diagnoses:  Influenza A     Discharge Instructions      You have flu A. Please quarantine until you have no fever for 24 hours.   Take tamiflu twice a day for 5 days     You can take Tylenol and/or Ibuprofen as needed for fever reduction and pain relief.   For cough: honey 1/2 to 1 teaspoon (you can dilute the honey in water or another fluid).  You can also use guaifenesin and dextromethorphan for cough. You can use a humidifier for chest congestion and cough.  If you don't have a humidifier, you can sit in the bathroom with the hot shower running.      For sore throat: try warm salt water gargles, cepacol lozenges, throat spray, warm tea or water with lemon/honey, popsicles or ice, or OTC cold relief medicine for throat discomfort.   For congestion: take a daily anti-histamine like Zyrtec, Claritin, and a oral decongestant, such as pseudoephedrine.  You can also use Flonase 1-2 sprays in each nostril daily.   It is important to stay hydrated: drink plenty of fluids (water, gatorade/powerade/pedialyte, juices, or teas) to keep your throat moisturized and help further relieve irritation/discomfort.     ED Prescriptions     Medication Sig Dispense Auth. Provider   oseltamivir (TAMIFLU) 75 MG capsule Take 1 capsule (75 mg total) by mouth every 12 (twelve) hours. 10 capsule Hans Eden, NP      PDMP not reviewed this  encounter.   Hans Eden, [place] 07/06/21 [phone]

## 2022-01-26 ENCOUNTER — Encounter (HOSPITAL_COMMUNITY): Payer: Self-pay | Admitting: Emergency Medicine

## 2022-01-26 ENCOUNTER — Emergency Department (HOSPITAL_COMMUNITY)
Admission: EM | Admit: 2022-01-26 | Discharge: 2022-01-27 | Disposition: A | Discharge status: Home / Self Care | Payer: Medicaid Other | Attending: Emergency Medicine | Admitting: Emergency Medicine

## 2022-01-26 ENCOUNTER — Emergency Department (HOSPITAL_COMMUNITY): Payer: Medicaid Other

## 2022-01-26 DIAGNOSIS — W1840XA Slipping, tripping and stumbling without falling, unspecified, initial encounter: Secondary | ICD-10-CM | POA: Insufficient documentation

## 2022-01-26 DIAGNOSIS — S93602A Unspecified sprain of left foot, initial encounter: Secondary | ICD-10-CM | POA: Diagnosis not present

## 2022-01-26 DIAGNOSIS — S9032XA Contusion of left foot, initial encounter: Secondary | ICD-10-CM | POA: Insufficient documentation

## 2022-01-26 DIAGNOSIS — S99922A Unspecified injury of left foot, initial encounter: Secondary | ICD-10-CM | POA: Diagnosis present

## 2022-01-26 NOTE — ED Provider Triage Note (Signed)
Emergency Medicine Provider Triage Evaluation Note  Alexandra Young , a 21 y.o. female  was evaluated in triage.  Pt complains of ankle injury.  Patient reports prior to arrival she was stepping out of an escalator when she rolled her left ankle.  Patient states that the pain is worsened when she attempts to dorsiflex her foot as well as evert her ankle.  Review of Systems  Positive:  Negative:   Physical Exam  BP 118/83 (BP Location: Right Arm)   Pulse 84   Temp 98.2 F (36.8 C) (Oral)   Resp 18   SpO2 100%  Gen:   Awake, no distress   Resp:  Normal effort  MSK:   Moves extremities without difficulty  Other:  2+ DP pulse  Medical Decision Making  Medically screening exam initiated at 9:30 PM.  Appropriate orders placed.  Alexandra Young was informed that the remainder of the evaluation will be completed by another provider, this initial triage assessment does not replace that evaluation, and the importance of remaining in the ED until their evaluation is complete.     Al Decant, PA-C 01/26/22 2130

## 2022-01-26 NOTE — ED Triage Notes (Signed)
Got out of car and rolled ankle. Left ankle pain and swelling.

## 2022-01-27 MED ORDER — IBUPROFEN 800 MG PO TABS
800.0000 mg | ORAL_TABLET | Freq: Once | ORAL | Status: AC
  Administered 2022-01-27: 800 mg via ORAL
  Filled 2022-01-27: qty 1

## 2022-01-27 MED ORDER — OXYCODONE-ACETAMINOPHEN 5-325 MG PO TABS
1.0000 | ORAL_TABLET | Freq: Once | ORAL | Status: AC
  Administered 2022-01-27: 1 via ORAL
  Filled 2022-01-27: qty 1

## 2022-01-27 MED ORDER — IBUPROFEN 800 MG PO TABS: 800.0000 mg | ORAL_TABLET | Freq: Four times a day (QID) | ORAL | 0 refills | Status: DC | PRN

## 2022-01-27 NOTE — ED Provider Notes (Addendum)
[facility]-EMERGENCY DEPT Provider Note   CSN: [id number] Arrival date & time: 01/26/22  2053     History  Chief Complaint  Patient presents with   Foot Injury    Alexandra Young is a 21 y.o. female.  Patient presents to the emergency department for evaluation of foot injury.  Patient reports that she tried to step off an escalator and rolled her foot and ankle.  Patient complains of pain, swelling over the midportion of the foot.      Home Medications Prior to Admission medications   Medication Sig Start Date End Date Taking? Authorizing Provider  ibuprofen (ADVIL) 800 MG tablet Take 1 tablet (800 mg total) by mouth every 6 (six) hours as needed for moderate pain. 01/27/22  Yes Tyresa Prindiville, Canary Brim, MD  albuterol (VENTOLIN HFA) 108 (90 Base) MCG/ACT inhaler Inhale 2 puffs into the lungs every 6 (six) hours as needed for wheezing or shortness of breath. 11/11/20   [provider]  busPIRone (BUSPAR) 10 MG tablet Take 10 mg by mouth 2 (two) times daily.    [provider]  butalbital-acetaminophen-caffeine (FIORICET) 50-325-40 MG tablet Take 1 tablet by mouth every 6 (six) hours as needed for migraine. 07/02/19   [provider]  cetirizine (ZYRTEC) 10 MG tablet Take 10 mg by mouth 2 (two) times daily.    [provider]  etonogestrel (NEXPLANON) 68 MG IMPL implant 68 mg by Subdermal route continuous.    [provider]  fluticasone (FLONASE) 50 MCG/ACT nasal spray Place 2 sprays into both nostrils daily as needed for allergies.    [provider]  Galcanezumab-gnlm (EMGALITY) 120 MG/ML SOAJ Inject 120 mg into the skin every 30 (thirty) days. 12/30/20   [provider]  HYDROcodone-acetaminophen (NORCO/VICODIN) 5-325 MG tablet Take 1 tablet by mouth every 8 (eight) hours as needed for moderate pain.    [provider]  hydrOXYzine (ATARAX/VISTARIL) 25 MG tablet Take 1 tablet (25 mg total) by  mouth 3 (three) times daily as needed for itching. 01/21/21   Danford, Earl Lites, MD  linaclotide (LINZESS) 145 MCG CAPS capsule Take 145 mcg by mouth daily as needed (constipation).    [provider]  loperamide (IMODIUM) 2 MG capsule Take 2 mg by mouth daily as needed for diarrhea or loose stools. 10/06/20   [provider]  montelukast (SINGULAIR) 10 MG tablet Take 10 mg by mouth at bedtime. 11/06/19   [provider]  omeprazole (PRILOSEC) 40 MG capsule Take 40 mg by mouth daily.    [provider]  ondansetron (ZOFRAN ODT) 4 MG disintegrating tablet Take 1 tablet (4 mg total) by mouth every 8 (eight) hours as needed for nausea or vomiting. 05/19/21   Hyman Hopes, Margaux, PA-C  ondansetron (ZOFRAN) 4 MG tablet Take 1 tablet (4 mg total) by mouth every 8 (eight) hours as needed for nausea or vomiting. 01/21/21   Danford, Earl Lites, MD  oseltamivir (TAMIFLU) 75 MG capsule Take 1 capsule (75 mg total) by mouth every 12 (twelve) hours. 07/04/21   White, Elita Boone, NP  polyethylene glycol (MIRALAX / GLYCOLAX) 17 g packet Take 17 g by mouth daily as needed for mild constipation.    [provider]  predniSONE (DELTASONE) 10 MG tablet Take prednisone 60 mg (6 tabs) daily for 1 week then 50 mg (5 tabs) for 1 week then 40 mg (4 tabs) for 1 week then 30 mg (3 tabs) for 1 week then 20 mg for  1 week then 10 mg for 1 week 01/21/21   Alberteen Sam, MD  traZODone (DESYREL) 50 MG tablet Take 50 mg by mouth at bedtime as needed for sleep. 12/12/20 03/12/21  [provider]  triamcinolone cream (KENALOG) 0.1 % Apply 1 application topically 2 (two) times daily. Apply to affected areas from neck down 01/21/21   Danford, Earl Lites, MD  Vitamin D, Ergocalciferol, (DRISDOL) 1.25 MG (50000 UNIT) CAPS capsule Take 50,000 Units by mouth every 7 (seven) days.    [provider]  medroxyPROGESTERone (DEPO-PROVERA) 150 MG/ML injection Inject 150 mg into the  muscle every 3 (three) months.  05/20/20  [provider]      Allergies    Lamictal [lamotrigine]    Review of Systems   Review of Systems  Physical Exam Updated Vital Signs BP 118/83 (BP Location: Right Arm)   Pulse 84   Temp 98.2 F (36.8 C) (Oral)   Resp 18   LMP 01/08/2022   SpO2 100%  Physical Exam Vitals and nursing note reviewed.  HENT:     Head: Atraumatic.  Cardiovascular:     Pulses:          Dorsalis pedis pulses are 2+ on the left side.  Musculoskeletal:     Left ankle: No swelling, deformity or ecchymosis. No tenderness. Normal range of motion.     Left Achilles Tendon: Normal.     Left foot: Swelling and tenderness present.     Comments: Bruising dorso-lateral midfoot  Skin:    Findings: Bruising (left foot) present.  Neurological:     Sensory: Sensation is intact.     Motor: Motor function is intact.    ED Results / Procedures / Treatments   Labs (all labs ordered are listed, but only abnormal results are displayed) Labs Reviewed - No data to display  EKG None  Radiology DG Ankle Complete Left  Result Date: 01/26/2022 CLINICAL DATA:  Injury. EXAM: LEFT FOOT - COMPLETE 3+ VIEW; LEFT ANKLE COMPLETE - 3+ VIEW COMPARISON:  None Available. FINDINGS: Left foot: There is a tiny density adjacent to the dorsal aspect of the navicular bone favored as chronic/related to old injury. No definite acute fracture or dislocation identified. There is no evidence of arthropathy or other focal bone abnormality. Soft tissues are unremarkable. Left ankle: No acute fracture or dislocation identified. Joint spaces are maintained and alignment is anatomic. Soft tissues are within normal limits. IMPRESSION: 1. Small density adjacent to the dorsal aspect of the navicular bone is likely related to old injury. Please correlate for point tenderness to exclude tiny avulsion fracture. 2. No acute fracture or dislocation of the left ankle. Electronically Signed   By: Darliss Cheney M.D.   On: 01/26/2022 21:54   DG Foot Complete Left  Result Date: 01/26/2022 CLINICAL DATA:  Injury. EXAM: LEFT FOOT - COMPLETE 3+ VIEW; LEFT ANKLE COMPLETE - 3+ VIEW COMPARISON:  None Available. FINDINGS: Left foot: There is a tiny density adjacent to the dorsal aspect of the navicular bone favored as chronic/related to old injury. No definite acute fracture or dislocation identified. There is no evidence of arthropathy or other focal bone abnormality. Soft tissues are unremarkable. Left ankle: No acute fracture or dislocation identified. Joint spaces are maintained and alignment is anatomic. Soft tissues are within normal limits. IMPRESSION: 1. Small density adjacent to the dorsal aspect of the navicular bone is likely related to old injury. Please correlate for point tenderness to exclude tiny avulsion fracture.  2. No acute fracture or dislocation of the left ankle. Electronically Signed   By: Darliss CheneyAmy  Guttmann M.D.   On: 01/26/2022 21:54    Procedures Procedures    Medications Ordered in ED Medications  oxyCODONE-acetaminophen (PERCOCET/ROXICET) 5-325 MG per tablet 1 tablet (has no administration in time range)  ibuprofen (ADVIL) tablet 800 mg (has no administration in time range)    ED Course/ Medical Decision Making/ A&P                           Medical Decision Making Amount and/or Complexity of Data Reviewed Radiology: ordered and independent interpretation performed. Decision-making details documented in ED Course.  Risk Prescription drug management.   Patient presents to the emergency department with foot and ankle injury.  Examination reveals bruising, swelling over the dorsal lateral midfoot area.  No proximal fibula tenderness.  Ankle exam is fairly benign.  X-ray of ankle is negative.  X-ray of foot shows small density next to the navicular.  This is in the general area of bruising and swelling, cannot rule out acute injury.  Will place in cam walker and have follow-up  with orthopedics.        Final Clinical Impression(s) / ED Diagnoses Final diagnoses:  Foot sprain, left, initial encounter    Rx / DC Orders ED Discharge Orders          Ordered    ibuprofen (ADVIL) 800 MG tablet  Every 6 hours PRN        01/27/22 0022              Gilda CreasePollina, Shaquan Puerta J, MD 01/27/22 0022    Gilda CreasePollina, Jaquelinne Glendening J, MD 01/27/22 [phone]

## 2022-02-06 ENCOUNTER — Ambulatory Visit: Payer: Medicaid Other | Admitting: Surgical

## 2022-03-29 ENCOUNTER — Ambulatory Visit (HOSPITAL_COMMUNITY)
Admission: EM | Admit: 2022-03-29 | Discharge: 2022-03-29 | Disposition: A | Discharge status: Home / Self Care | Payer: Medicaid Other | Attending: Emergency Medicine | Admitting: Emergency Medicine

## 2022-03-29 ENCOUNTER — Encounter (HOSPITAL_COMMUNITY): Payer: Self-pay

## 2022-03-29 ENCOUNTER — Ambulatory Visit (HOSPITAL_COMMUNITY): Admission: EM | Admit: 2022-03-29 | Payer: Medicaid Other | Source: Home / Self Care

## 2022-03-29 VITALS — BP 107/75 | HR 83 | Temp 98.3°F | Resp 18

## 2022-03-29 DIAGNOSIS — K5900 Constipation, unspecified: Secondary | ICD-10-CM | POA: Diagnosis not present

## 2022-03-29 DIAGNOSIS — K644 Residual hemorrhoidal skin tags: Secondary | ICD-10-CM | POA: Diagnosis not present

## 2022-03-29 MED ORDER — HYDROCORTISONE ACETATE 25 MG RE SUPP: 25.0000 mg | Freq: Two times a day (BID) | RECTAL | 0 refills | Status: DC

## 2022-03-29 NOTE — ED Notes (Signed)
Chaperoned a rectal exam.

## 2022-03-29 NOTE — ED Provider Notes (Signed)
[place]    CSN: [id number] Arrival date & time: 03/29/22  1112      History   Chief Complaint Chief Complaint  Patient presents with   Hemorrhoids    Entered by patient    HPI Alexandra Young is a 21 y.o. female.   21 year old female, Alexandra Young, presents to urgent care chief complaint of hemorrhoid for a day.  Patient states she has been having some constipation and straining, no treatment tried.  The history is provided by the patient. No language interpreter was used.    Past Medical History:  Diagnosis Date   Anxiety    Asthma    Depression    DRESS syndrome 01/12/2021   IBS (irritable bowel syndrome)    Migraine    per pt    Patient Active Problem List   Diagnosis Date Noted   External hemorrhoid 03/29/2022   Constipation 03/29/2022   DRESS syndrome 01/12/2021   Bipolar affective disorder, current episode mixed (HCC)    Attention deficit disorder    Elevated LFTs    Epigastric pain    Left flank pain    Rash    Endocarditis of tricuspid valve    Transaminitis    Intractable pain 01/08/2021   Pelvic pain 01/08/2021   Fever 01/08/2021   Splenomegaly 01/08/2021   Intractable nausea and vomiting 01/07/2021    Past Surgical History:  Procedure Laterality Date   BONE MARROW BIOPSY     TEE WITHOUT CARDIOVERSION N/A 01/12/2021   Procedure: TRANSESOPHAGEAL ECHOCARDIOGRAM (TEE);  Surgeon: Jerline Pain, MD;  Location: [facility] ENDOSCOPY;  Service: Cardiovascular;  Laterality: N/A;    OB History   No obstetric history on file.      Home Medications    Prior to Admission medications   Medication Sig Start Date End Date Taking? Authorizing Provider  hydrocortisone (ANUSOL-HC) 25 MG suppository Place 1 suppository (25 mg total) rectally 2 (two) times daily. 2/45/80  Yes Cj Beecher, Jeanett Schlein, NP  albuterol (VENTOLIN HFA) 108 (90 Base) MCG/ACT inhaler Inhale 2 puffs into the lungs every 6 (six) hours as needed for wheezing or shortness of breath.  11/11/20   [provider]  busPIRone (BUSPAR) 10 MG tablet Take 10 mg by mouth 2 (two) times daily.    [provider]  butalbital-acetaminophen-caffeine (FIORICET) 50-325-40 MG tablet Take 1 tablet by mouth every 6 (six) hours as needed for migraine. 07/02/19   [provider]  cetirizine (ZYRTEC) 10 MG tablet Take 10 mg by mouth 2 (two) times daily.    [provider]  etonogestrel (NEXPLANON) 68 MG IMPL implant 68 mg by Subdermal route continuous.    [provider]  fluticasone (FLONASE) 50 MCG/ACT nasal spray Place 2 sprays into both nostrils daily as needed for allergies.    [provider]  Galcanezumab-gnlm (EMGALITY) 120 MG/ML SOAJ Inject 120 mg into the skin every 30 (thirty) days. 12/30/20   [provider]  HYDROcodone-acetaminophen (NORCO/VICODIN) 5-325 MG tablet Take 1 tablet by mouth every 8 (eight) hours as needed for moderate pain.    [provider]  hydrOXYzine (ATARAX/VISTARIL) 25 MG tablet Take 1 tablet (25 mg total) by mouth 3 (three) times daily as needed for itching. 01/21/21   Danford, Suann Larry, MD  ibuprofen (ADVIL) 800 MG tablet Take 1 tablet (800 mg total) by mouth every 6 (six) hours as needed for moderate pain. 01/27/22   Orpah Greek, MD  linaclotide (LINZESS) 145 MCG CAPS capsule Take 145  mcg by mouth daily as needed (constipation).    [provider]  loperamide (IMODIUM) 2 MG capsule Take 2 mg by mouth daily as needed for diarrhea or loose stools. 10/06/20   [provider]  montelukast (SINGULAIR) 10 MG tablet Take 10 mg by mouth at bedtime. 11/06/19   [provider]  omeprazole (PRILOSEC) 40 MG capsule Take 40 mg by mouth daily.    [provider]  ondansetron (ZOFRAN ODT) 4 MG disintegrating tablet Take 1 tablet (4 mg total) by mouth every 8 (eight) hours as needed for nausea or vomiting. 05/19/21   Alroy Bailiff, Margaux, PA-C  ondansetron (ZOFRAN) 4 MG  tablet Take 1 tablet (4 mg total) by mouth every 8 (eight) hours as needed for nausea or vomiting. 01/21/21   Danford, Suann Larry, MD  oseltamivir (TAMIFLU) 75 MG capsule Take 1 capsule (75 mg total) by mouth every 12 (twelve) hours. 07/04/21   White, Leitha Schuller, NP  polyethylene glycol (MIRALAX / GLYCOLAX) 17 g packet Take 17 g by mouth daily as needed for mild constipation.    [provider]  predniSONE (DELTASONE) 10 MG tablet Take prednisone 60 mg (6 tabs) daily for 1 week then 50 mg (5 tabs) for 1 week then 40 mg (4 tabs) for 1 week then 30 mg (3 tabs) for 1 week then 20 mg for 1 week then 10 mg for 1 week 01/21/21   Edwin Dada, MD  traZODone (DESYREL) 50 MG tablet Take 50 mg by mouth at bedtime as needed for sleep. 12/12/20 03/12/21  [provider]  triamcinolone cream (KENALOG) 0.1 % Apply 1 application topically 2 (two) times daily. Apply to affected areas from neck down 01/21/21   Danford, Suann Larry, MD  Vitamin D, Ergocalciferol, (DRISDOL) 1.25 MG (50000 UNIT) CAPS capsule Take 50,000 Units by mouth every 7 (seven) days.    [provider]  medroxyPROGESTERone (DEPO-PROVERA) 150 MG/ML injection Inject 150 mg into the muscle every 3 (three) months.  05/20/20  [provider]    Family History Family History  Problem Relation Age of Onset   Asthma Mother    Thyroid disease Mother    Cancer Mother    Diabetes Mother    Diabetes Father    Migraines Father    Depression Sister     Social History Social History   Tobacco Use   Smoking status: Never   Smokeless tobacco: Never  Vaping Use   Vaping Use: Never used  Substance Use Topics   Alcohol use: Yes    Comment: soccially   Drug use: Yes    Types: Marijuana    Comment: edibals once weekly per pt     Allergies   Lamictal [lamotrigine]   Review of Systems Review of Systems  Constitutional:  Negative for fever.  Gastrointestinal:  Positive for constipation and rectal  pain. Negative for abdominal pain.  Genitourinary:  Negative for dysuria.  All other systems reviewed and are negative.    Physical Exam Triage Vital Signs ED Triage Vitals  Enc Vitals Group     BP 03/29/22 1149 107/75     Pulse Rate 03/29/22 1149 83     Resp 03/29/22 1149 18     Temp 03/29/22 1149 98.3 F (36.8 C)     Temp Source 03/29/22 1149 Oral     SpO2 03/29/22 1149 97 %     Weight --      Height --      Head Circumference --  Peak Flow --      Pain Score 03/29/22 1146 4     Pain Loc --      Pain Edu? --      Excl. in [place]? --    No data found.  Updated Vital Signs BP 107/75 (BP Location: Right Arm)   Pulse 83   Temp 98.3 F (36.8 C) (Oral)   Resp 18   SpO2 97%   Visual Acuity Right Eye Distance:   Left Eye Distance:   Bilateral Distance:    Right Eye Near:   Left Eye Near:    Bilateral Near:     Physical Exam Vitals and nursing note reviewed. Exam conducted with a chaperone present.  Constitutional:      General: She is not in acute distress.    Appearance: She is well-developed and well-groomed.  HENT:     Head: Normocephalic and atraumatic.  Eyes:     Conjunctiva/sclera: Conjunctivae normal.  Cardiovascular:     Rate and Rhythm: Normal rate and regular rhythm.     Heart sounds: No murmur heard. Pulmonary:     Effort: Pulmonary effort is normal. No respiratory distress.     Breath sounds: Normal breath sounds.  Abdominal:     Palpations: Abdomen is soft.     Tenderness: There is no abdominal tenderness.  Genitourinary:    Rectum: Tenderness and external hemorrhoid present. No internal hemorrhoid.  Musculoskeletal:        General: No swelling.     Cervical back: Neck supple.  Skin:    General: Skin is warm and dry.     Capillary Refill: Capillary refill takes less than 2 seconds.  Neurological:     General: No focal deficit present.     Mental Status: She is alert and oriented to person, place, and time.     GCS: GCS eye subscore is  4. GCS verbal subscore is 5. GCS motor subscore is 6.  Psychiatric:        Attention and Perception: Attention normal.        Mood and Affect: Mood normal.        Speech: Speech normal.        Behavior: Behavior normal. Behavior is cooperative.      UC Treatments / Results  Labs (all labs ordered are listed, but only abnormal results are displayed) Labs Reviewed - No data to display  EKG   Radiology No results found.  Procedures Procedures (including critical care time)  Medications Ordered in UC Medications - No data to display  Initial Impression / Assessment and Plan / UC Course  I have reviewed the triage vital signs and the nursing notes.  Pertinent labs & imaging results that were available during my care of the patient were reviewed by me and considered in my medical decision making (see chart for details).     Ddx: Hemorrhoid Final Clinical Impressions(s) / UC Diagnoses   Final diagnoses:  External hemorrhoid  Constipation, unspecified constipation type     Discharge Instructions      Drink plenty of water, take stool softeners, avoid straining.  May try sitz bath.  Use Anusol as directed. follow-up with your PCP     ED Prescriptions     Medication Sig Dispense Auth. Provider   hydrocortisone (ANUSOL-HC) 25 MG suppository Place 1 suppository (25 mg total) rectally 2 (two) times daily. 12 suppository Colan Laymon, Jeanett Schlein, NP      PDMP not reviewed this encounter.   Taylar Hartsough,  Jeanett Schlein, NP 03/29/22 1244

## 2022-03-29 NOTE — Discharge Instructions (Addendum)
Drink plenty of water, take stool softeners, avoid straining.  May try sitz bath.  Use Anusol as directed. follow-up with your PCP

## 2022-03-29 NOTE — ED Triage Notes (Signed)
Patient presents to Urgent Care with complaints of protruding Hemorid that is painful  since yesterday. Patient reports she hasn't had a bm since she noticed the pain no bleeding at site no otc creams due to burning sensation.

## 2022-06-11 ENCOUNTER — Other Ambulatory Visit (HOSPITAL_COMMUNITY): Payer: Self-pay | Admitting: Orthopedic Surgery

## 2022-06-13 ENCOUNTER — Encounter (HOSPITAL_BASED_OUTPATIENT_CLINIC_OR_DEPARTMENT_OTHER): Payer: Self-pay | Admitting: Orthopedic Surgery

## 2022-06-13 NOTE — Progress Notes (Signed)
   06/13/22 1045  PAT Phone Screen  Do You Have Diabetes? No  Do You Have Hypertension? No  Have You Ever Been to the ER for Asthma? No  Have You Taken Oral Steroids in the Past 3 Months? No  Do you Take Phenteramine or any Other Diet Drugs? No  Recent  Lab Work, EKG, CXR? No  Do you have a history of heart problems? No  Have You Ever Had Tests on Your Heart? Yes (EF 60-65%)  When was Test Performed? 01/12/21  Where? [facility], was going through testing and found to have DRESS syndrome  Any Recent Hospitalizations? No  Height 5\' 2"  (1.575 m)  Weight 59 kg  Pat Appointment Scheduled No  Reason for No Appointment Not Needed

## 2022-06-18 NOTE — Progress Notes (Signed)

## 2022-06-21 ENCOUNTER — Ambulatory Visit (HOSPITAL_BASED_OUTPATIENT_CLINIC_OR_DEPARTMENT_OTHER): Payer: Medicaid Other | Admitting: Anesthesiology

## 2022-06-21 ENCOUNTER — Other Ambulatory Visit: Payer: Self-pay

## 2022-06-21 ENCOUNTER — Ambulatory Visit (HOSPITAL_BASED_OUTPATIENT_CLINIC_OR_DEPARTMENT_OTHER): Payer: Medicaid Other

## 2022-06-21 ENCOUNTER — Ambulatory Visit (HOSPITAL_BASED_OUTPATIENT_CLINIC_OR_DEPARTMENT_OTHER)
Admission: RE | Admit: 2022-06-21 | Discharge: 2022-06-21 | Disposition: A | Discharge status: Home / Self Care | Payer: Medicaid Other | Source: Ambulatory Visit | Attending: Orthopedic Surgery | Admitting: Orthopedic Surgery

## 2022-06-21 ENCOUNTER — Encounter (HOSPITAL_BASED_OUTPATIENT_CLINIC_OR_DEPARTMENT_OTHER): Payer: Self-pay | Admitting: Orthopedic Surgery

## 2022-06-21 ENCOUNTER — Encounter (HOSPITAL_BASED_OUTPATIENT_CLINIC_OR_DEPARTMENT_OTHER)
Admission: RE | Disposition: A | Discharge status: Home / Self Care | Payer: Self-pay | Source: Ambulatory Visit | Attending: Orthopedic Surgery

## 2022-06-21 DIAGNOSIS — Q6689 Other  specified congenital deformities of feet: Secondary | ICD-10-CM | POA: Insufficient documentation

## 2022-06-21 DIAGNOSIS — J45909 Unspecified asthma, uncomplicated: Secondary | ICD-10-CM | POA: Diagnosis not present

## 2022-06-21 DIAGNOSIS — K219 Gastro-esophageal reflux disease without esophagitis: Secondary | ICD-10-CM | POA: Insufficient documentation

## 2022-06-21 DIAGNOSIS — F1290 Cannabis use, unspecified, uncomplicated: Secondary | ICD-10-CM | POA: Insufficient documentation

## 2022-06-21 DIAGNOSIS — Z01818 Encounter for other preprocedural examination: Secondary | ICD-10-CM

## 2022-06-21 HISTORY — PX: SUBTALAR COALITION EXCISION: SHX6844

## 2022-06-21 HISTORY — DX: Gastro-esophageal reflux disease without esophagitis: K21.9

## 2022-06-21 LAB — POCT PREGNANCY, URINE: Preg Test, Ur: NEGATIVE

## 2022-06-21 SURGERY — EXCISION, COALITION, SUBTALAR JOINT
Anesthesia: General | Site: Ankle | Laterality: Left

## 2022-06-21 MED ORDER — SODIUM CHLORIDE 0.9 % IV SOLN: INTRAVENOUS | Status: DC

## 2022-06-21 MED ORDER — MIDAZOLAM HCL 2 MG/2ML IJ SOLN
INTRAMUSCULAR | Status: AC
  Filled 2022-06-21: qty 2

## 2022-06-21 MED ORDER — FENTANYL CITRATE (PF) 100 MCG/2ML IJ SOLN
INTRAMUSCULAR | Status: AC
  Filled 2022-06-21: qty 2

## 2022-06-21 MED ORDER — ONDANSETRON HCL 4 MG/2ML IJ SOLN
INTRAMUSCULAR | Status: DC | PRN
  Administered 2022-06-21: 4 mg via INTRAVENOUS

## 2022-06-21 MED ORDER — ACETAMINOPHEN 325 MG PO TABS: 325.0000 mg | ORAL_TABLET | ORAL | Status: DC | PRN

## 2022-06-21 MED ORDER — AMISULPRIDE (ANTIEMETIC) 5 MG/2ML IV SOLN: 10.0000 mg | Freq: Once | INTRAVENOUS | Status: DC | PRN

## 2022-06-21 MED ORDER — 0.9 % SODIUM CHLORIDE (POUR BTL) OPTIME
TOPICAL | Status: DC | PRN
  Administered 2022-06-21: 200 mL

## 2022-06-21 MED ORDER — OXYCODONE HCL 5 MG PO TABS
ORAL_TABLET | ORAL | Status: AC
  Filled 2022-06-21: qty 1

## 2022-06-21 MED ORDER — FENTANYL CITRATE (PF) 100 MCG/2ML IJ SOLN: 25.0000 ug | INTRAMUSCULAR | Status: DC | PRN

## 2022-06-21 MED ORDER — VANCOMYCIN HCL 500 MG IV SOLR
INTRAVENOUS | Status: DC | PRN
  Administered 2022-06-21: 500 mg via TOPICAL

## 2022-06-21 MED ORDER — OXYCODONE HCL 5 MG PO TABS: 5.0000 mg | ORAL_TABLET | Freq: Four times a day (QID) | ORAL | 0 refills | Status: AC | PRN

## 2022-06-21 MED ORDER — OXYCODONE HCL 5 MG/5ML PO SOLN: 5.0000 mg | Freq: Once | ORAL | Status: AC | PRN

## 2022-06-21 MED ORDER — BUPIVACAINE-EPINEPHRINE (PF) 0.5% -1:200000 IJ SOLN
INTRAMUSCULAR | Status: DC | PRN
  Administered 2022-06-21: 20 mL

## 2022-06-21 MED ORDER — FENTANYL CITRATE (PF) 100 MCG/2ML IJ SOLN
50.0000 ug | Freq: Once | INTRAMUSCULAR | Status: AC
  Administered 2022-06-21: 50 ug via INTRAVENOUS

## 2022-06-21 MED ORDER — PROMETHAZINE HCL 25 MG/ML IJ SOLN: 6.2500 mg | INTRAMUSCULAR | Status: DC | PRN

## 2022-06-21 MED ORDER — ACETAMINOPHEN 10 MG/ML IV SOLN: 1000.0000 mg | Freq: Once | INTRAVENOUS | Status: DC | PRN

## 2022-06-21 MED ORDER — CEFAZOLIN SODIUM-DEXTROSE 2-4 GM/100ML-% IV SOLN
2.0000 g | INTRAVENOUS | Status: AC
  Administered 2022-06-21: 2 g via INTRAVENOUS

## 2022-06-21 MED ORDER — LIDOCAINE HCL (CARDIAC) PF 100 MG/5ML IV SOSY
PREFILLED_SYRINGE | INTRAVENOUS | Status: DC | PRN
  Administered 2022-06-21: 40 mg via INTRATRACHEAL

## 2022-06-21 MED ORDER — ONDANSETRON HCL 4 MG/2ML IJ SOLN
INTRAMUSCULAR | Status: AC
  Filled 2022-06-21: qty 2

## 2022-06-21 MED ORDER — PROPOFOL 10 MG/ML IV BOLUS
INTRAVENOUS | Status: DC | PRN
  Administered 2022-06-21: 150 mg via INTRAVENOUS

## 2022-06-21 MED ORDER — ROPIVACAINE HCL 5 MG/ML IJ SOLN
INTRAMUSCULAR | Status: DC | PRN
  Administered 2022-06-21: 15 mL via PERINEURAL

## 2022-06-21 MED ORDER — ACETAMINOPHEN 160 MG/5ML PO SOLN: 325.0000 mg | ORAL | Status: DC | PRN

## 2022-06-21 MED ORDER — MIDAZOLAM HCL 2 MG/2ML IJ SOLN
2.0000 mg | Freq: Once | INTRAMUSCULAR | Status: AC
  Administered 2022-06-21: 2 mg via INTRAVENOUS

## 2022-06-21 MED ORDER — LACTATED RINGERS IV SOLN: INTRAVENOUS | Status: DC

## 2022-06-21 MED ORDER — SODIUM CHLORIDE 0.9 % IV SOLN
INTRAVENOUS | Status: AC | PRN
  Administered 2022-06-21: 100 mL

## 2022-06-21 MED ORDER — DEXAMETHASONE SODIUM PHOSPHATE 10 MG/ML IJ SOLN
INTRAMUSCULAR | Status: DC | PRN
  Administered 2022-06-21: 10 mg via INTRAVENOUS

## 2022-06-21 MED ORDER — OXYCODONE HCL 5 MG PO TABS
5.0000 mg | ORAL_TABLET | Freq: Once | ORAL | Status: AC | PRN
  Administered 2022-06-21: 5 mg via ORAL

## 2022-06-21 MED ORDER — CEFAZOLIN SODIUM-DEXTROSE 2-4 GM/100ML-% IV SOLN
INTRAVENOUS | Status: AC
  Filled 2022-06-21: qty 100

## 2022-06-21 MED ORDER — FENTANYL CITRATE (PF) 100 MCG/2ML IJ SOLN
INTRAMUSCULAR | Status: DC | PRN
  Administered 2022-06-21: 50 ug via INTRAVENOUS

## 2022-06-21 MED ORDER — PROPOFOL 10 MG/ML IV BOLUS
INTRAVENOUS | Status: AC
  Filled 2022-06-21: qty 20

## 2022-06-21 SURGICAL SUPPLY — 77 items
APL PRP STRL LF DISP 70% ISPRP (MISCELLANEOUS) ×1
BANDAGE ESMARK 6X9 LF (GAUZE/BANDAGES/DRESSINGS) IMPLANT
BLADE AVERAGE 25X9 (BLADE) IMPLANT
BLADE MINI RND TIP GREEN BEAV (BLADE) IMPLANT
BLADE OSC/SAG .038X5.5 CUT EDG (BLADE) IMPLANT
BLADE SURG 15 STRL LF DISP TIS (BLADE) ×2 IMPLANT
BLADE SURG 15 STRL SS (BLADE) ×3
BNDG CMPR 9X4 STRL LF SNTH (GAUZE/BANDAGES/DRESSINGS)
BNDG CMPR 9X6 STRL LF SNTH (GAUZE/BANDAGES/DRESSINGS)
BNDG ELASTIC 4X5.8 VLCR STR LF (GAUZE/BANDAGES/DRESSINGS) IMPLANT
BNDG ELASTIC 6X5.8 VLCR STR LF (GAUZE/BANDAGES/DRESSINGS) IMPLANT
BNDG ESMARK 4X9 LF (GAUZE/BANDAGES/DRESSINGS) IMPLANT
BNDG ESMARK 6X9 LF (GAUZE/BANDAGES/DRESSINGS)
BOOT STEPPER DURA LG (SOFTGOODS) IMPLANT
BOOT STEPPER DURA MED (SOFTGOODS) IMPLANT
BOOT STEPPER DURA SM (SOFTGOODS) IMPLANT
BUR MIS STRT 2.0X19.5 (BUR) IMPLANT
BURR 2X19.5 (BUR) ×1
BURR MIS STRT 2.0X19.5 (BUR) ×1
CANISTER SUCT 1200ML W/VALVE (MISCELLANEOUS) IMPLANT
CHLORAPREP W/TINT 26 (MISCELLANEOUS) ×1 IMPLANT
CORD BIPOLAR FORCEPS 12FT (ELECTRODE) ×1 IMPLANT
COVER BACK TABLE 60X90IN (DRAPES) ×1 IMPLANT
CUFF TOURN SGL QUICK 24 (TOURNIQUET CUFF) ×1
CUFF TOURN SGL QUICK 34 (TOURNIQUET CUFF)
CUFF TRNQT CYL 24X4X16.5-23 (TOURNIQUET CUFF) ×1 IMPLANT
CUFF TRNQT CYL 34X4.125X (TOURNIQUET CUFF) IMPLANT
DRAPE EXTREMITY T 121X128X90 (DISPOSABLE) ×1 IMPLANT
DRAPE OEC MINIVIEW 54X84 (DRAPES) ×1 IMPLANT
DRAPE U-SHAPE 47X51 STRL (DRAPES) ×1 IMPLANT
DRSG MEPITEL 4X7.2 (GAUZE/BANDAGES/DRESSINGS) ×1 IMPLANT
ELECT REM PT RETURN 9FT ADLT (ELECTROSURGICAL) ×1
ELECTRODE REM PT RTRN 9FT ADLT (ELECTROSURGICAL) ×1 IMPLANT
GAUZE PAD ABD 8X10 STRL (GAUZE/BANDAGES/DRESSINGS) ×1 IMPLANT
GAUZE SPONGE 4X4 12PLY STRL (GAUZE/BANDAGES/DRESSINGS) ×2 IMPLANT
GLOVE BIO SURGEON STRL SZ8 (GLOVE) ×1 IMPLANT
GLOVE BIOGEL PI IND STRL 8 (GLOVE) ×2 IMPLANT
GLOVE ECLIPSE 8.0 STRL XLNG CF (GLOVE) ×1 IMPLANT
GOWN STRL REUS W/ TWL LRG LVL3 (GOWN DISPOSABLE) ×1 IMPLANT
GOWN STRL REUS W/ TWL XL LVL3 (GOWN DISPOSABLE) ×2 IMPLANT
GOWN STRL REUS W/TWL LRG LVL3 (GOWN DISPOSABLE) ×1
GOWN STRL REUS W/TWL XL LVL3 (GOWN DISPOSABLE) ×2
K-WIRE DBL .054X4 NSTRL (WIRE) ×1
KWIRE DBL .054X4 NSTRL (WIRE) ×1 IMPLANT
LOOP VESSEL MAXI BLUE (MISCELLANEOUS) IMPLANT
NDL HYPO 25X1 1.5 SAFETY (NEEDLE) IMPLANT
NEEDLE HYPO 22GX1.5 SAFETY (NEEDLE) ×1 IMPLANT
NEEDLE HYPO 25X1 1.5 SAFETY (NEEDLE) IMPLANT
NS IRRIG 1000ML POUR BTL (IV SOLUTION) ×1 IMPLANT
PACK BASIN DAY SURGERY FS (CUSTOM PROCEDURE TRAY) ×1 IMPLANT
PAD CAST 4YDX4 CTTN HI CHSV (CAST SUPPLIES) ×1 IMPLANT
PADDING CAST COTTON 4X4 STRL (CAST SUPPLIES) ×1
PADDING CAST COTTON 6X4 STRL (CAST SUPPLIES) IMPLANT
PENCIL SMOKE EVACUATOR (MISCELLANEOUS) ×1 IMPLANT
SANITIZER HAND PURELL 535ML FO (MISCELLANEOUS) ×1 IMPLANT
SET IRRIGATION 3.8 (SET/KITS/TRAYS/PACK) IMPLANT
SHEET MEDIUM DRAPE 40X70 STRL (DRAPES) ×1 IMPLANT
SLEEVE SCD COMPRESS KNEE MED (STOCKING) ×1 IMPLANT
SPLINT PLASTER CAST FAST 5X30 (CAST SUPPLIES) IMPLANT
SPONGE SURGIFOAM ABS GEL 12-7 (HEMOSTASIS) IMPLANT
SPONGE T-LAP 18X18 ~~LOC~~+RFID (SPONGE) ×1 IMPLANT
STOCKINETTE 6  STRL (DRAPES) ×1
STOCKINETTE 6 STRL (DRAPES) ×1 IMPLANT
SUCTION FRAZIER HANDLE 10FR (MISCELLANEOUS) ×1
SUCTION TUBE FRAZIER 10FR DISP (MISCELLANEOUS) IMPLANT
SUT BONE WAX W31G (SUTURE) IMPLANT
SUT ETHILON 3 0 PS 1 (SUTURE) ×1 IMPLANT
SUT MNCRL AB 3-0 PS2 18 (SUTURE) ×1 IMPLANT
SUT VIC AB 2-0 SH 27 (SUTURE) ×1
SUT VIC AB 2-0 SH 27XBRD (SUTURE) ×1 IMPLANT
SUT VICRYL 0 SH 27 (SUTURE) IMPLANT
SYR BULB EAR ULCER 3OZ GRN STR (SYRINGE) ×1 IMPLANT
SYR CONTROL 10ML LL (SYRINGE) IMPLANT
TOWEL GREEN STERILE FF (TOWEL DISPOSABLE) ×1 IMPLANT
TUBE CONNECTING 20X1/4 (TUBING) IMPLANT
UNDERPAD 30X36 HEAVY ABSORB (UNDERPADS AND DIAPERS) ×1 IMPLANT
YANKAUER SUCT BULB TIP NO VENT (SUCTIONS) IMPLANT

## 2022-06-21 NOTE — Anesthesia Preprocedure Evaluation (Addendum)
Anesthesia Evaluation  Patient identified by MRN, date of birth, ID band Patient awake    Reviewed: Allergy & Precautions, NPO status , Patient's Chart, lab work & pertinent test results  Airway Mallampati: I  TM Distance: >3 FB Neck ROM: Full    Dental  (+) Teeth Intact, Dental Advisory Given   Pulmonary asthma ,    breath sounds clear to auscultation       Cardiovascular negative cardio ROS   Rhythm:Regular Rate:Normal     Neuro/Psych  Headaches, PSYCHIATRIC DISORDERS Anxiety Depression Bipolar Disorder    GI/Hepatic Neg liver ROS, GERD  Medicated,  Endo/Other  negative endocrine ROS  Renal/GU negative Renal ROS     Musculoskeletal negative musculoskeletal ROS (+)   Abdominal   Peds  Hematology negative hematology ROS (+)   Anesthesia Other Findings   Reproductive/Obstetrics                            Anesthesia Physical Anesthesia Plan  ASA: 2  Anesthesia Plan: General   Post-op Pain Management: Regional block*   Induction: Intravenous  PONV Risk Score and Plan: 4 or greater and Ondansetron, Dexamethasone, Midazolam and Scopolamine patch - Pre-op  Airway Management Planned: LMA  Additional Equipment: None  Intra-op Plan:   Post-operative Plan: Extubation in OR  Informed Consent: I have reviewed the patients History and Physical, chart, labs and discussed the procedure including the risks, benefits and alternatives for the proposed anesthesia with the patient or authorized representative who has indicated his/her understanding and acceptance.     Dental advisory given  Plan Discussed with: CRNA  Anesthesia Plan Comments:        Anesthesia Quick Evaluation

## 2022-06-21 NOTE — Progress Notes (Signed)
Assisted Dr. Hollis with left, adductor canal, popliteal, ultrasound guided block. Side rails up, monitors on throughout procedure. See vital signs in flow sheet. Tolerated Procedure well. 

## 2022-06-21 NOTE — Anesthesia Procedure Notes (Signed)
Anesthesia Regional Block: Adductor canal block   Pre-Anesthetic Checklist: , timeout performed,  Correct Patient, Correct Site, Correct Laterality,  Correct Procedure, Correct Position, site marked,  Risks and benefits discussed,  Surgical consent,  Pre-op evaluation,  At surgeon's request and post-op pain management  Laterality: Left  Prep: chloraprep       Needles:  Injection technique: Single-shot  Needle Type: Echogenic Stimulator Needle     Needle Length: 9cm  Needle Gauge: 21     Additional Needles:   Procedures:,,,, ultrasound used (permanent image in chart),,    Narrative:  Start time: 06/21/2022 11:15 AM End time: 06/21/2022 11:20 AM Injection made incrementally with aspirations every 5 mL.  Performed by: Personally  Anesthesiologist: Effie Berkshire, MD  Additional Notes: Patient tolerated the procedure well. Local anesthetic introduced in an incremental fashion under minimal resistance after negative aspirations. No paresthesias were elicited. After completion of the procedure, no acute issues were identified and patient continued to be monitored by RN.

## 2022-06-21 NOTE — Op Note (Signed)
06/21/2022  1:19 PM  PATIENT:  Alexandra Young  21 y.o. female  PRE-OPERATIVE DIAGNOSIS:  Left foot tarsal coalition (calcaneonavicular)  POST-OPERATIVE DIAGNOSIS: Same  Procedure(s): 1.  Left foot calcaneonavicular coalition excision   2.  Two-view left foot radiographs  SURGEON:  Wylene Simmer, MD  ASSISTANT: None  ANESTHESIA:   General, regional  EBL:  minimal   TOURNIQUET:   Total Tourniquet Time Documented: Thigh (Left) - 42 minutes Total: Thigh (Left) - 42 minutes  COMPLICATIONS:  None apparent  DISPOSITION:  Extubated, awake and stable to recovery.  INDICATION FOR PROCEDURE: 21 year old female with persistent left ankle pain after a sprain.  A follow-up MRI shows a coalition between the calcaneus and the navicular.  She has failed nonoperative treatment to date and presents for excision of the calcaneonavicular coalition.  The risks and benefits of the alternative treatment options have been discussed in detail.  The patient wishes to proceed with surgery and specifically understands risks of bleeding, infection, nerve damage, blood clots, need for additional surgery, amputation and death.   PROCEDURE IN DETAIL:  After pre operative consent was obtained, and the correct operative site was identified, the patient was brought to the operating room and placed supine on the OR table.  Anesthesia was administered.  Pre-operative antibiotics were administered.  A surgical timeout was taken.  The left lower extremity was prepped and draped in standard sterile fashion with a tourniquet around the thigh.  The extremity was elevated and the thigh tourniquet was inflated to 250 mmHg.  An incision was made at the dorsal lateral hindfoot over the coalition.  Dissection was carried down through the subcutaneous tissues.  The extensor digitorum brevis muscle was identified.  The fibers were split and retracted.  The coalition was identified.  The proximal and distal extent of the coalition was  marked with needles.  A radiograph confirmed appropriate position of the needles.  A 2 mm x 20 mm Shannon bur was then used to resect bone on both sides of the coalition removing it entirely.  Repeat radiograph confirmed complete excision of the coalition.  The hindfoot would move well with no evident bony impingement.  The wound was irrigated copiously.  The cut surfaces of bone were coated with bone wax.  The fascia over the EDB muscle was repaired with inverted simple sutures of 2-0 Vicryl.  The skin incision was closed with horizontal mattress sutures of 3-0 nylon.  Sterile dressings were applied followed by a compression wrap and a cam boot.  The tourniquet was released after application of the dressings.  The patient was awakened from anesthesia and transported to the recovery room in stable condition.   FOLLOW UP PLAN: Weightbearing as tolerated on the left foot in a cam boot.  Follow-up in the office in 2 weeks for suture removal.  The boot may be removed for gentle range of motion, sleep and showering.  No indication for DVT prophylaxis in this ambulatory patient.   RADIOGRAPHS: An oblique radiograph of the hindfoot shows a coalition between the anterior process of the calcaneus and the lateral navicular.  A repeat radiograph intraoperatively shows complete resection of the coalition.  No other acute injuries are noted.

## 2022-06-21 NOTE — Anesthesia Postprocedure Evaluation (Signed)
Anesthesia Post Note  Patient: Felicidad Sugarman  Procedure(s) Performed: left foot calcaneonavicular coalition excision (Left: Ankle)     Patient location during evaluation: PACU Anesthesia Type: General Level of consciousness: awake and alert Pain management: pain level controlled Vital Signs Assessment: post-procedure vital signs reviewed and stable Respiratory status: spontaneous breathing, nonlabored ventilation, respiratory function stable and patient connected to nasal cannula oxygen Cardiovascular status: blood pressure returned to baseline and stable Postop Assessment: no apparent nausea or vomiting Anesthetic complications: no   No notable events documented.  Last Vitals:  Vitals:   06/21/22 1355 06/21/22 1412  BP:  127/73  Pulse: 84 87  Resp: 20 18  Temp:  (!) 36.4 C  SpO2: 99% 100%    Last Pain:  Vitals:   06/21/22 1412  TempSrc: Oral  PainSc: 0-No pain                 Effie Berkshire

## 2022-06-21 NOTE — H&P (Signed)
Alexandra Young is an 21 y.o. female.   Chief Complaint: left foot pain HPI: 21 y/o female without significant PMH c/o L foot pain after a sprain many months ago.  She has failed non op treatment.  An MRI reveals a calcaneonavicular coalition.  She presents now for surgical excision of the coalition.  Past Medical History:  Diagnosis Date   Anxiety    Asthma    Depression    DRESS syndrome 01/12/2021   GERD (gastroesophageal reflux disease)    IBS (irritable bowel syndrome)    Migraine    per pt    Past Surgical History:  Procedure Laterality Date   BONE MARROW BIOPSY     TEE WITHOUT CARDIOVERSION N/A 01/12/2021   Procedure: TRANSESOPHAGEAL ECHOCARDIOGRAM (TEE);  Surgeon: Jerline Pain, MD;  Location: [facility] ENDOSCOPY;  Service: Cardiovascular;  Laterality: N/A;    Family History  Problem Relation Age of Onset   Asthma Mother    Thyroid disease Mother    Cancer Mother    Diabetes Mother    Diabetes Father    Migraines Father    Depression Sister    Social History:  reports that she has never smoked. She has never used smokeless tobacco. She reports current alcohol use. She reports current drug use. Drug: Marijuana.  Allergies:  Allergies  Allergen Reactions   Lamictal [Lamotrigine] Other (See Comments)    Abdominal pain, liver function affected, gallbladder affected, heart valve issue, full body rash per pt from medication.    Medications Prior to Admission  Medication Sig Dispense Refill   cetirizine (ZYRTEC) 10 MG tablet Take 10 mg by mouth 2 (two) times daily.     etonogestrel (NEXPLANON) 68 MG IMPL implant 68 mg by Subdermal route continuous.     fluticasone (FLONASE) 50 MCG/ACT nasal spray Place 2 sprays into both nostrils daily as needed for allergies.     Galcanezumab-gnlm (EMGALITY) 120 MG/ML SOAJ Inject 120 mg into the skin every 30 (thirty) days.     hydrocortisone (ANUSOL-HC) 25 MG suppository Place 1 suppository (25 mg total) rectally 2 (two) times daily. 12  suppository 0   hydrOXYzine (ATARAX/VISTARIL) 25 MG tablet Take 1 tablet (25 mg total) by mouth 3 (three) times daily as needed for itching. 30 tablet 1   ibuprofen (ADVIL) 800 MG tablet Take 1 tablet (800 mg total) by mouth every 6 (six) hours as needed for moderate pain. 20 tablet 0   montelukast (SINGULAIR) 10 MG tablet Take 10 mg by mouth at bedtime.     omeprazole (PRILOSEC) 40 MG capsule Take 40 mg by mouth daily.     ondansetron (ZOFRAN ODT) 4 MG disintegrating tablet Take 1 tablet (4 mg total) by mouth every 8 (eight) hours as needed for nausea or vomiting. 20 tablet 0   Vitamin D, Ergocalciferol, (DRISDOL) 1.25 MG (50000 UNIT) CAPS capsule Take 50,000 Units by mouth every 7 (seven) days.     albuterol (VENTOLIN HFA) 108 (90 Base) MCG/ACT inhaler Inhale 2 puffs into the lungs every 6 (six) hours as needed for wheezing or shortness of breath.     busPIRone (BUSPAR) 10 MG tablet Take 10 mg by mouth 2 (two) times daily.     butalbital-acetaminophen-caffeine (FIORICET) 50-325-40 MG tablet Take 1 tablet by mouth every 6 (six) hours as needed for migraine.     linaclotide (LINZESS) 145 MCG CAPS capsule Take 145 mcg by mouth daily as needed (constipation).     loperamide (IMODIUM) 2 MG capsule Take 2  mg by mouth daily as needed for diarrhea or loose stools.     ondansetron (ZOFRAN) 4 MG tablet Take 1 tablet (4 mg total) by mouth every 8 (eight) hours as needed for nausea or vomiting. 30 tablet 0   polyethylene glycol (MIRALAX / GLYCOLAX) 17 g packet Take 17 g by mouth daily as needed for mild constipation.     traZODone (DESYREL) 50 MG tablet Take 50 mg by mouth at bedtime as needed for sleep.     triamcinolone cream (KENALOG) 0.1 % Apply 1 application topically 2 (two) times daily. Apply to affected areas from neck down 80 g 2    Results for orders placed or performed during the hospital encounter of Jul 01, 2022 (from the past 48 hour(s))  Pregnancy, urine POC     Status: None   Collection Time:  2022/07/01 10:24 AM  Result Value Ref Range   Preg Test, Ur NEGATIVE NEGATIVE    Comment:        THE SENSITIVITY OF THIS METHODOLOGY IS >24 mIU/mL    No results found.  Review of Systems  no recent f/c/n/v/wt loss.  Blood pressure 109/76, pulse 73, temperature 98.1 F (36.7 C), temperature source Oral, resp. rate 18, height _0  (1.575 m), weight 54 kg, last menstrual period 05/30/2022, SpO2 100 %. Physical Exam  39 wd female in n ad.  A and O x 4.  Normal mood and affect.  EOMI.  Resp unlabored.  L foot with healthy skin.  TTP at the dorsal lateral hindfoot.  Pulses are palpable.  No lymphadenopathy.  Intact sens to LT at the dorsal foot.  Assessment/Plan L calcaneonavicular coalition - to the OR today for excision of the painful coalition.  The risks and benefits of the alternative treatment options have been discussed in detail.  The patient wishes to proceed with surgery and specifically understands risks of bleeding, infection, nerve damage, blood clots, need for additional surgery, amputation and death.   Wylene Simmer, MD 01-Jul-2022, 11:42 AM

## 2022-06-21 NOTE — Anesthesia Procedure Notes (Signed)
Anesthesia Regional Block: Popliteal block   Pre-Anesthetic Checklist: , timeout performed,  Correct Patient, Correct Site, Correct Laterality,  Correct Procedure, Correct Position, site marked,  Risks and benefits discussed,  Surgical consent,  Pre-op evaluation,  At surgeon's request and post-op pain management  Laterality: Left  Prep: chloraprep       Needles:  Injection technique: Single-shot  Needle Type: Echogenic Stimulator Needle     Needle Length: 9cm  Needle Gauge: 21     Additional Needles:   Procedures:,,,, ultrasound used (permanent image in chart),,    Narrative:  Start time: 06/21/2022 11:10 AM End time: 06/21/2022 11:15 AM Injection made incrementally with aspirations every 5 mL.  Performed by: Personally  Anesthesiologist: Effie Berkshire, MD  Additional Notes: Patient tolerated the procedure well. Local anesthetic introduced in an incremental fashion under minimal resistance after negative aspirations. No paresthesias were elicited. After completion of the procedure, no acute issues were identified and patient continued to be monitored by RN.

## 2022-06-21 NOTE — Discharge Instructions (Addendum)
Alexandra Simmer, MD [facility]  Please read the following information regarding your care after surgery.  Medications  You only need a prescription for the narcotic pain medicine (ex. oxycodone, Percocet, Norco).  All of the other medicines listed below are available over the counter. X Aleve 2 pills twice a day for the first 3 days after surgery. X acetominophen (Tylenol) 650 mg every 4-6 hours as you need for minor to moderate pain X oxycodone as prescribed for severe pain  Weight Bearing X Bear weight when you are able on your operated leg or foot in the cam boot  Cast / Splint / Dressing X  Keep your splint, cast or dressing clean and dry.  Don't put anything (coat hanger, pencil, etc) down inside of it.  If it gets damp, use a hair dryer on the cool setting to dry it.  If it gets soaked, call the office to schedule an appointment for a cast change. X After your dressing, cast or splint is removed; you may shower, but do not soak or scrub the wound.  Allow the water to run over it, and then gently pat it dry.  Swelling It is normal for you to have swelling where you had surgery.  To reduce swelling and pain, keep your toes above your nose for at least 3 days after surgery.  It may be necessary to keep your foot or leg elevated for several weeks.  If it hurts, it should be elevated.  Follow Up Call my office at [phone] when you are discharged from the hospital or surgery center to schedule an appointment to be seen two weeks after surgery.  Call my office at [phone] if you develop a fever >101.5 F, nausea, vomiting, bleeding from the surgical site or severe pain.     Post Anesthesia Home Care Instructions  Activity: Get plenty of rest for the remainder of the day. A responsible individual must stay with you for 24 hours following the procedure.  For the next 24 hours, DO NOT: -Drive a car -Paediatric nurse -Drink alcoholic beverages -Take any medication unless  instructed by your physician -Make any legal decisions or sign important papers.  Meals: Start with liquid foods such as gelatin or soup. Progress to regular foods as tolerated. Avoid greasy, spicy, heavy foods. If nausea and/or vomiting occur, drink only clear liquids until the nausea and/or vomiting subsides. Call your physician if vomiting continues.  Special Instructions/Symptoms: Your throat may feel dry or sore from the anesthesia or the breathing tube placed in your throat during surgery. If this causes discomfort, gargle with warm salt water. The discomfort should disappear within 24 hours.  If you had a scopolamine patch placed behind your ear for the management of post- operative nausea and/or vomiting:  1. The medication in the patch is effective for 72 hours, after which it should be removed.  Wrap patch in a tissue and discard in the trash. Wash hands thoroughly with soap and water. 2. You may remove the patch earlier than 72 hours if you experience unpleasant side effects which may include dry mouth, dizziness or visual disturbances. 3. Avoid touching the patch. Wash your hands with soap and water after contact with the patch.     Regional Anesthesia Blocks  1. Numbness or the inability to move the "blocked" extremity may last from 3-48 hours after placement. The length of time depends on the medication injected and your individual response to the medication. If the numbness is not going away  after 48 hours, call your surgeon.  2. The extremity that is blocked will need to be protected until the numbness is gone and the  Strength has returned. Because you cannot feel it, you will need to take extra care to avoid injury. Because it may be weak, you may have difficulty moving it or using it. You may not know what position it is in without looking at it while the block is in effect.  3. For blocks in the legs and feet, returning to weight bearing and walking needs to be done  carefully. You will need to wait until the numbness is entirely gone and the strength has returned. You should be able to move your leg and foot normally before you try and bear weight or walk. You will need someone to be with you when you first try to ensure you do not fall and possibly risk injury.  4. Bruising and tenderness at the needle site are common side effects and will resolve in a few days.  5. Persistent numbness or new problems with movement should be communicated to the surgeon or the [facility] [phone] [facility] [phone]).

## 2022-06-21 NOTE — Anesthesia Procedure Notes (Signed)
Procedure Name: LMA Insertion Date/Time: 06/21/2022 12:11 PM  Performed by: Glory Buff, CRNAPre-anesthesia Checklist: Patient identified, Emergency Drugs available, Patient being monitored and Suction available Patient Re-evaluated:Patient Re-evaluated prior to induction Oxygen Delivery Method: Circle system utilized Preoxygenation: Pre-oxygenation with 100% oxygen Induction Type: IV induction LMA: LMA inserted LMA Size: 4.0 Number of attempts: 1 Placement Confirmation: positive ETCO2 Tube secured with: Tape Dental Injury: Teeth and Oropharynx as per pre-operative assessment

## 2022-06-21 NOTE — Transfer of Care (Signed)
Immediate Anesthesia Transfer of Care Note  Patient: Alexandra Young  Procedure(s) Performed: left foot calcaneonavicular coalition excision (Left: Ankle)  Patient Location: PACU  Anesthesia Type:General and Regional  Level of Consciousness: drowsy and patient cooperative  Airway & Oxygen Therapy: Patient Spontanous Breathing and Patient connected to face mask oxygen  Post-op Assessment: Report given to RN and Post -op Vital signs reviewed and stable  Post vital signs: Reviewed and stable  Last Vitals:  Vitals Value Taken Time  BP 101/60 06/21/22 1314  Temp    Pulse 81 06/21/22 1314  Resp 13 06/21/22 1314  SpO2 100 % 06/21/22 1314    Last Pain:  Vitals:   06/21/22 1038  TempSrc: Oral  PainSc: 4          Complications: No notable events documented.

## 2022-06-26 ENCOUNTER — Encounter (HOSPITAL_BASED_OUTPATIENT_CLINIC_OR_DEPARTMENT_OTHER): Payer: Self-pay | Admitting: Orthopedic Surgery

## 2022-10-08 ENCOUNTER — Encounter (HOSPITAL_COMMUNITY): Payer: Self-pay

## 2022-10-08 ENCOUNTER — Ambulatory Visit (HOSPITAL_COMMUNITY)
Admission: EM | Admit: 2022-10-08 | Discharge: 2022-10-08 | Disposition: A | Discharge status: Home / Self Care | Payer: Medicaid Other | Attending: Internal Medicine | Admitting: Internal Medicine

## 2022-10-08 DIAGNOSIS — F411 Generalized anxiety disorder: Secondary | ICD-10-CM

## 2022-10-08 DIAGNOSIS — R0789 Other chest pain: Secondary | ICD-10-CM

## 2022-10-08 MED ORDER — ALBUTEROL SULFATE HFA 108 (90 BASE) MCG/ACT IN AERS: 1.0000 | INHALATION_SPRAY | Freq: Four times a day (QID) | RESPIRATORY_TRACT | 0 refills | Status: AC | PRN

## 2022-10-08 MED ORDER — IBUPROFEN 600 MG PO TABS: 600.0000 mg | ORAL_TABLET | Freq: Four times a day (QID) | ORAL | 0 refills | Status: DC | PRN

## 2022-10-08 MED ORDER — HYDROXYZINE HCL 25 MG PO TABS: 25.0000 mg | ORAL_TABLET | Freq: Three times a day (TID) | ORAL | 0 refills | Status: DC | PRN

## 2022-10-08 NOTE — Discharge Instructions (Addendum)
Gentle stretching exercises Please take medications as prescribed Your symptoms is most likely related to chest wall pain. If you have any other concerns please return to urgent care to be reevaluated.

## 2022-10-08 NOTE — ED Triage Notes (Signed)
Pt is here for chest pain , pt suffers from anxiety
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
## 2023-02-14 ENCOUNTER — Ambulatory Visit
Admission: RE | Admit: 2023-02-14 | Discharge: 2023-02-14 | Disposition: A | Discharge status: Home / Self Care | Payer: No Typology Code available for payment source | Source: Ambulatory Visit | Attending: Internal Medicine | Admitting: Internal Medicine

## 2023-02-14 VITALS — BP 106/71 | HR 75 | Temp 98.4°F | Resp 18

## 2023-02-14 DIAGNOSIS — R319 Hematuria, unspecified: Secondary | ICD-10-CM | POA: Insufficient documentation

## 2023-02-14 DIAGNOSIS — Z113 Encounter for screening for infections with a predominantly sexual mode of transmission: Secondary | ICD-10-CM | POA: Diagnosis present

## 2023-02-14 LAB — POCT URINALYSIS DIP (MANUAL ENTRY)
Bilirubin, UA: NEGATIVE
Glucose, UA: NEGATIVE mg/dL
Ketones, POC UA: NEGATIVE mg/dL
Leukocytes, UA: NEGATIVE
Nitrite, UA: NEGATIVE
Protein Ur, POC: NEGATIVE mg/dL
Spec Grav, UA: >=1.03 — AB (ref 1.010–1.025)
Urobilinogen, UA: 0.2 E.U./dL
pH, UA: 6 (ref 5.0–8.0)

## 2023-02-14 LAB — POCT URINE PREGNANCY: Preg Test, Ur: NEGATIVE

## 2023-02-14 NOTE — Discharge Instructions (Signed)
The clinic will contact you with the results of the testing done today if positive  Follow up with your gynecologist or PCP if needed

## 2023-02-14 NOTE — ED Triage Notes (Signed)
Pt presents for STD testing.  Denies sxs. She received an anonymous text msg stating she may have been exposed to an STD.

## 2023-02-14 NOTE — ED Provider Notes (Signed)
[facility]    CSN: [id number] Arrival date & time: 02/14/23  1050      History   Chief Complaint Chief Complaint  Patient presents with   Exposure to STD    Got an anonymous message that I may be exposed to a std would like to be tested and treated to be on the safe side - Entered by patient    HPI Alexandra Young is a 22 y.o. female presents for STD screening. Pt states she received anonymous test stating she may have been exposed to gonorrhea. She thinks it was a spam text but would like screening just in case. Denies dysuria, vaginal discharge, fevers, chills, flank pain, or N/V. No other concerns at this time.    Exposure to STD    Past Medical History:  Diagnosis Date   Anxiety    Asthma    Depression    DRESS syndrome 01/12/2021   GERD (gastroesophageal reflux disease)    IBS (irritable bowel syndrome)    Migraine    per pt    Patient Active Problem List   Diagnosis Date Noted   External hemorrhoid 03/29/2022   Constipation 03/29/2022   DRESS syndrome 01/12/2021   Bipolar affective disorder, current episode mixed (HCC)    Attention deficit disorder    Elevated LFTs    Epigastric pain    Left flank pain    Rash    Endocarditis of tricuspid valve    Transaminitis    Intractable pain 01/08/2021   Pelvic pain 01/08/2021   Fever 01/08/2021   Splenomegaly 01/08/2021   Intractable nausea and vomiting 01/07/2021    Past Surgical History:  Procedure Laterality Date   BONE MARROW BIOPSY     SUBTALAR COALITION EXCISION Left 06/21/2022   Procedure: Left foot calcaneonavicular coalition excision;  Surgeon: Toni Arthurs, MD;  Location: [facility];  Service: Orthopedics;  Laterality: Left;   TEE WITHOUT CARDIOVERSION N/A 01/12/2021   Procedure: TRANSESOPHAGEAL ECHOCARDIOGRAM (TEE);  Surgeon: Jake Bathe, MD;  Location: [facility] ENDOSCOPY;  Service: Cardiovascular;  Laterality: N/A;    OB History   No obstetric history on file.      Home  Medications    Prior to Admission medications   Medication Sig Start Date End Date Taking? Authorizing Provider  albuterol (VENTOLIN HFA) 108 (90 Base) MCG/ACT inhaler Inhale 1-2 puffs into the lungs every 6 (six) hours as needed for wheezing or shortness of breath. 10/08/22   Lamptey, Britta Mccreedy, MD  busPIRone (BUSPAR) 10 MG tablet Take 10 mg by mouth 2 (two) times daily.    [provider]  butalbital-acetaminophen-caffeine (FIORICET) 50-325-40 MG tablet Take 1 tablet by mouth every 6 (six) hours as needed for migraine. 07/02/19   [provider]  cetirizine (ZYRTEC) 10 MG tablet Take 10 mg by mouth 2 (two) times daily.    [provider]  etonogestrel (NEXPLANON) 68 MG IMPL implant 68 mg by Subdermal route continuous.    [provider]  fluticasone (FLONASE) 50 MCG/ACT nasal spray Place 2 sprays into both nostrils daily as needed for allergies.    [provider]  hydrOXYzine (ATARAX) 25 MG tablet Take 1 tablet (25 mg total) by mouth every 8 (eight) hours as needed. 10/08/22   Merrilee Jansky, MD  ibuprofen (ADVIL) 600 MG tablet Take 1 tablet (600 mg total) by mouth every 6 (six) hours as needed. 10/08/22   Merrilee Jansky, MD  linaclotide Karlene Einstein) 145 MCG CAPS capsule  Take 145 mcg by mouth daily as needed (constipation).    [provider]  montelukast (SINGULAIR) 10 MG tablet Take 10 mg by mouth at bedtime. 11/06/19   [provider]  ondansetron (ZOFRAN ODT) 4 MG disintegrating tablet Take 1 tablet (4 mg total) by mouth every 8 (eight) hours as needed for nausea or vomiting. 05/19/21   Tanda Rockers, PA-C  traZODone (DESYREL) 50 MG tablet Take 50 mg by mouth at bedtime as needed for sleep. 12/12/20 06/13/22  [provider]  triamcinolone cream (KENALOG) 0.1 % Apply 1 application topically 2 (two) times daily. Apply to affected areas from neck down 01/21/21   Danford, Earl Lites, MD  Vitamin D, Ergocalciferol, (DRISDOL)  1.25 MG (50000 UNIT) CAPS capsule Take 50,000 Units by mouth every 7 (seven) days.    [provider]  medroxyPROGESTERone (DEPO-PROVERA) 150 MG/ML injection Inject 150 mg into the muscle every 3 (three) months.  05/20/20  [provider]    Family History Family History  Problem Relation Age of Onset   Asthma Mother    Thyroid disease Mother    Cancer Mother    Diabetes Mother    Diabetes Father    Migraines Father    Depression Sister     Social History Social History   Tobacco Use   Smoking status: Never   Smokeless tobacco: Never  Vaping Use   Vaping Use: Never used  Substance Use Topics   Alcohol use: Yes    Comment: socially   Drug use: Yes    Types: Marijuana    Comment: edibles occasionally     Allergies   Lamotrigine   Review of Systems Review of Systems  Genitourinary:        STD screening     Physical Exam Triage Vital Signs ED Triage Vitals  Enc Vitals Group     BP 02/14/23 1114 106/71     Pulse Rate 02/14/23 1114 75     Resp 02/14/23 1114 18     Temp 02/14/23 1114 98.4 F (36.9 C)     Temp Source 02/14/23 1114 Oral     SpO2 02/14/23 1114 98 %     Weight --      Height --      Head Circumference --      Peak Flow --      Pain Score 02/14/23 1113 0     Pain Loc --      Pain Edu? --      Excl. in [place]? --    No data found.  Updated Vital Signs BP 106/71 (BP Location: Right Arm)   Pulse 75   Temp 98.4 F (36.9 C) (Oral)   Resp 18   LMP 02/05/2023 (Exact Date)   SpO2 98%   Visual Acuity Right Eye Distance:   Left Eye Distance:   Bilateral Distance:    Right Eye Near:   Left Eye Near:    Bilateral Near:     Physical Exam Vitals and nursing note reviewed.  Constitutional:      Appearance: Normal appearance.  HENT:     Head: Normocephalic and atraumatic.  Eyes:     Pupils: Pupils are equal, round, and reactive to light.  Cardiovascular:     Rate and Rhythm: Normal rate.  Pulmonary:     Effort: Pulmonary  effort is normal.  Abdominal:     Tenderness: There is no right CVA tenderness or left CVA tenderness.  Skin:  General: Skin is warm and dry.  Neurological:     General: No focal deficit present.     Mental Status: She is alert and oriented to person, place, and time.  Psychiatric:        Mood and Affect: Mood normal.        Behavior: Behavior normal.      UC Treatments / Results  Labs (all labs ordered are listed, but only abnormal results are displayed) Labs Reviewed  POCT URINALYSIS DIP (MANUAL ENTRY) - Abnormal; Notable for the following components:      Result Value   Spec Grav, UA >=1.030 (*)    Blood, UA trace-intact (*)    All other components within normal limits  URINE CULTURE  POCT URINE PREGNANCY  CERVICOVAGINAL ANCILLARY ONLY    EKG   Radiology No results found.  Procedures Procedures (including critical care time)  Medications Ordered in UC Medications - No data to display  Initial Impression / Assessment and Plan / UC Course  I have reviewed the triage vital signs and the nursing notes.  Pertinent labs & imaging results that were available during my care of the patient were reviewed by me and considered in my medical decision making (see chart for details).     STD testing as ordered and will call if positive. Declined blood work UA neg UTI but with some hematuria, will culture Follow up with PCP or GYN as needed ER precautions reviewed  Final Clinical Impressions(s) / UC Diagnoses   Final diagnoses:  Screening examination for STD (sexually transmitted disease)  Hematuria, unspecified type     Discharge Instructions      The clinic will contact you with the results of the testing done today if positive  Follow up with your gynecologist or PCP if needed    ED Prescriptions   None    PDMP not reviewed this encounter.   Radford Pax, NP 02/14/23 1151

## 2023-02-15 LAB — CERVICOVAGINAL ANCILLARY ONLY
Bacterial Vaginitis (gardnerella): NEGATIVE
Candida Glabrata: NEGATIVE
Candida Vaginitis: NEGATIVE
Chlamydia: NEGATIVE
Comment: NEGATIVE
Comment: NEGATIVE
Comment: NEGATIVE
Comment: NEGATIVE
Comment: NEGATIVE
Comment: NORMAL
Neisseria Gonorrhea: NEGATIVE
Trichomonas: NEGATIVE

## 2023-02-15 LAB — URINE CULTURE: Culture: NO GROWTH

## 2023-05-20 ENCOUNTER — Other Ambulatory Visit: Payer: Self-pay

## 2023-05-20 ENCOUNTER — Encounter (HOSPITAL_BASED_OUTPATIENT_CLINIC_OR_DEPARTMENT_OTHER): Payer: Self-pay | Admitting: Emergency Medicine

## 2023-05-20 ENCOUNTER — Emergency Department (HOSPITAL_BASED_OUTPATIENT_CLINIC_OR_DEPARTMENT_OTHER)
Admission: EM | Admit: 2023-05-20 | Discharge: 2023-05-20 | Disposition: A | Discharge status: Home / Self Care | Payer: No Typology Code available for payment source

## 2023-05-20 DIAGNOSIS — Z7951 Long term (current) use of inhaled steroids: Secondary | ICD-10-CM | POA: Diagnosis not present

## 2023-05-20 DIAGNOSIS — G43909 Migraine, unspecified, not intractable, without status migrainosus: Secondary | ICD-10-CM | POA: Diagnosis not present

## 2023-05-20 DIAGNOSIS — R519 Headache, unspecified: Secondary | ICD-10-CM | POA: Insufficient documentation

## 2023-05-20 DIAGNOSIS — J45909 Unspecified asthma, uncomplicated: Secondary | ICD-10-CM | POA: Insufficient documentation

## 2023-05-20 LAB — CBC
HCT: 35.2 % — ABNORMAL LOW (ref 36.0–46.0)
Hemoglobin: 11.8 g/dL — ABNORMAL LOW (ref 12.0–15.0)
MCH: 29.9 pg (ref 26.0–34.0)
MCHC: 33.5 g/dL (ref 30.0–36.0)
MCV: 89.1 fL (ref 80.0–100.0)
Platelets: 297 10*3/uL (ref 150–400)
RBC: 3.95 MIL/uL (ref 3.87–5.11)
RDW: 14.6 % (ref 11.5–15.5)
WBC: 10.3 10*3/uL (ref 4.0–10.5)
nRBC: 0 % (ref 0.0–0.2)

## 2023-05-20 LAB — BASIC METABOLIC PANEL
Anion gap: 8 (ref 5–15)
BUN: 15 mg/dL (ref 6–20)
CO2: 24 mmol/L (ref 22–32)
Calcium: 8.9 mg/dL (ref 8.9–10.3)
Chloride: 104 mmol/L (ref 98–111)
Creatinine, Ser: 0.62 mg/dL (ref 0.44–1.00)
GFR, Estimated: >60 mL/min (ref >60)
Glucose, Bld: 126 mg/dL — ABNORMAL HIGH (ref 70–99)
Potassium: 3.7 mmol/L (ref 3.5–5.1)
Sodium: 136 mmol/L (ref 135–145)

## 2023-05-20 LAB — HCG, SERUM, QUALITATIVE: Preg, Serum: NEGATIVE

## 2023-05-20 MED ORDER — DIPHENHYDRAMINE HCL 50 MG/ML IJ SOLN
25.0000 mg | Freq: Once | INTRAMUSCULAR | Status: AC
  Administered 2023-05-20: 25 mg via INTRAVENOUS
  Filled 2023-05-20: qty 1

## 2023-05-20 MED ORDER — DEXAMETHASONE SODIUM PHOSPHATE 10 MG/ML IJ SOLN
10.0000 mg | Freq: Once | INTRAMUSCULAR | Status: AC
  Administered 2023-05-20: 10 mg via INTRAVENOUS
  Filled 2023-05-20: qty 1

## 2023-05-20 MED ORDER — KETOROLAC TROMETHAMINE 15 MG/ML IJ SOLN
15.0000 mg | Freq: Once | INTRAMUSCULAR | Status: AC
  Administered 2023-05-20: 15 mg via INTRAVENOUS
  Filled 2023-05-20: qty 1

## 2023-05-20 MED ORDER — SODIUM CHLORIDE 0.9 % IV BOLUS
500.0000 mL | Freq: Once | INTRAVENOUS | Status: AC
  Administered 2023-05-20: 500 mL via INTRAVENOUS

## 2023-05-20 MED ORDER — METOCLOPRAMIDE HCL 5 MG/ML IJ SOLN
10.0000 mg | Freq: Once | INTRAMUSCULAR | Status: AC
  Administered 2023-05-20: 10 mg via INTRAVENOUS
  Filled 2023-05-20: qty 2

## 2023-05-20 MED ORDER — LIDOCAINE 5 % EX PTCH
1.0000 | MEDICATED_PATCH | CUTANEOUS | Status: DC
  Administered 2023-05-20: 1 via TRANSDERMAL
  Filled 2023-05-20: qty 1

## 2023-05-20 NOTE — ED Triage Notes (Signed)
Pt arrives to ED with c/o migraine x8 days. Pt notes she has recently gotten off of birth control which she relates to her migraines.

## 2023-05-20 NOTE — Discharge Instructions (Signed)
As discussed, your visit to the emergency department today was overall reassuring.  No abnormality on your test.  Recommend follow-up with your neurologist or your headache specialist for reevaluation of your symptoms.  You may continue to take your at home Ubrelvy/Fioricet as needed for any headaches in the future.  Please do not hesitate to return to emergency department if there are worrisome signs and symptoms we discussed become apparent.

## 2023-05-20 NOTE — ED Provider Notes (Signed)
[place] EMERGENCY DEPARTMENT AT [facility] Provider Note   CSN: [id number] Arrival date & time: 05/20/23  1450     History  Chief Complaint  Patient presents with   Migraine    Alexandra Young is a 22 y.o. female.   Migraine   22 year old female presents emergency department with complaints of headache.  Patient states that she has been having headache for the past 3 weeks.  Reports gradual onset headache with worsening since onset.  States he has a history of migraine type headaches of which she was on Fioricet for.  States that Fioricet has been taking away some of her pain but without complete resolution.  States that she has had prolonged headaches in the past lasting up for a month at a time requiring emergency department visits as well as 1 time being hospitalized.  Reports headache posterior neck with radiation to the frontal part of her head.  She states that distribution of headache is similar to other headaches she has had in the past.  Denies any visual disturbance, gait abnormality, slurred speech, weakness/sensory deficits in upper or lower extremities.  Denies any fever, neck stiffness/rigidity.  Was seen by primary care provider on the sixth and prescribed Ubrelvy which she states did not help very much.  Past medical history significant for dress syndrome, GERD, IBS, migraine, anxiety, asthma  Home Medications Prior to Admission medications   Medication Sig Start Date End Date Taking? Authorizing Provider  albuterol (VENTOLIN HFA) 108 (90 Base) MCG/ACT inhaler Inhale 1-2 puffs into the lungs every 6 (six) hours as needed for wheezing or shortness of breath. 10/08/22   Lamptey, Britta Mccreedy, MD  busPIRone (BUSPAR) 10 MG tablet Take 10 mg by mouth 2 (two) times daily.    [provider]  butalbital-acetaminophen-caffeine (FIORICET) 50-325-40 MG tablet Take 1 tablet by mouth every 6 (six) hours as needed for migraine. 07/02/19   [provider]   cetirizine (ZYRTEC) 10 MG tablet Take 10 mg by mouth 2 (two) times daily.    [provider]  etonogestrel (NEXPLANON) 68 MG IMPL implant 68 mg by Subdermal route continuous.    [provider]  fluticasone (FLONASE) 50 MCG/ACT nasal spray Place 2 sprays into both nostrils daily as needed for allergies.    [provider]  hydrOXYzine (ATARAX) 25 MG tablet Take 1 tablet (25 mg total) by mouth every 8 (eight) hours as needed. 10/08/22   Merrilee Jansky, MD  ibuprofen (ADVIL) 600 MG tablet Take 1 tablet (600 mg total) by mouth every 6 (six) hours as needed. 10/08/22   Merrilee Jansky, MD  linaclotide Karlene Einstein) 145 MCG CAPS capsule Take 145 mcg by mouth daily as needed (constipation).    [provider]  montelukast (SINGULAIR) 10 MG tablet Take 10 mg by mouth at bedtime. 11/06/19   [provider]  ondansetron (ZOFRAN ODT) 4 MG disintegrating tablet Take 1 tablet (4 mg total) by mouth every 8 (eight) hours as needed for nausea or vomiting. 05/19/21   Tanda Rockers, PA-C  traZODone (DESYREL) 50 MG tablet Take 50 mg by mouth at bedtime as needed for sleep. 12/12/20 06/13/22  [provider]  triamcinolone cream (KENALOG) 0.1 % Apply 1 application topically 2 (two) times daily. Apply to affected areas from neck down 01/21/21   Danford, Earl Lites, MD  Vitamin D, Ergocalciferol, (DRISDOL) 1.25 MG (50000 UNIT) CAPS capsule Take 50,000 Units by mouth every 7 (seven) days.    [provider]  medroxyPROGESTERone (DEPO-PROVERA) 150 MG/ML injection Inject 150 mg into the muscle every 3 (three) months.  05/20/20  [provider]      Allergies    Lamotrigine    Review of Systems   Review of Systems  All other systems reviewed and are negative.   Physical Exam Updated Vital Signs BP 108/71 (BP Location: Right Arm)   Pulse 78   Temp 98.5 F (36.9 C)   Resp 14   Ht 5\' 2"  (1.575 m)   Wt 46.7 kg   SpO2 100%   BMI 18.84 kg/m   Physical Exam Vitals and nursing note reviewed.  Constitutional:      General: She is not in acute distress.    Appearance: She is well-developed.  HENT:     Head: Normocephalic and atraumatic.  Eyes:     Conjunctiva/sclera: Conjunctivae normal.  Cardiovascular:     Rate and Rhythm: Normal rate and regular rhythm.     Heart sounds: No murmur heard. Pulmonary:     Effort: Pulmonary effort is normal. No respiratory distress.     Breath sounds: Normal breath sounds.  Abdominal:     Palpations: Abdomen is soft.     Tenderness: There is no abdominal tenderness.  Musculoskeletal:        General: No swelling.     Cervical back: Neck supple.  Skin:    General: Skin is warm and dry.     Capillary Refill: Capillary refill takes less than 2 seconds.  Neurological:     Mental Status: She is alert.     Comments: Alert and oriented to self, place, time and event.   Speech is fluent, clear without dysarthria or dysphasia.   Strength 5/5 in upper/lower extremities   Sensation intact in upper/lower extremities   Normal gait.  CN I not tested  CN II not tested CN III, IV, VI PERRLA and EOMs intact bilaterally  CN V Intact sensation to sharp and light touch to the face  CN VII facial movements symmetric  CN VIII not tested  CN IX, X no uvula deviation, symmetric rise of soft palate  CN XI 5/5 SCM and trapezius strength bilaterally  CN XII Midline tongue protrusion, symmetric L/R movements     Psychiatric:        Mood and Affect: Mood normal.     ED Results / Procedures / Treatments   Labs (all labs ordered are listed, but only abnormal results are displayed) Labs Reviewed  CBC - Abnormal; Notable for the following components:      Result Value   Hemoglobin 11.8 (*)    HCT 35.2 (*)    All other components within normal limits  BASIC METABOLIC PANEL - Abnormal; Notable for the following components:   Glucose, Bld 126 (*)    All other components within normal limits  HCG,  SERUM, QUALITATIVE    EKG None  Radiology No results found.  Procedures Procedures    Medications Ordered in ED Medications  metoCLOPramide (REGLAN) injection 10 mg (has no administration in time range)  sodium chloride 0.9 % bolus 500 mL (has no administration in time range)  lidocaine (LIDODERM) 5 % 1 patch (has no administration in time range)  ketorolac (TORADOL) 15 MG/ML injection 15 mg (15 mg Intravenous Given 05/20/23 1850)  diphenhydrAMINE (BENADRYL) injection 25 mg (25 mg Intravenous Given 05/20/23 1853)  dexamethasone (DECADRON) injection 10 mg (10 mg Intravenous Given 05/20/23 1854)    ED Course/ Medical Decision Making/ A&P  Medical Decision Making Amount and/or Complexity of Data Reviewed Labs: ordered.  Risk Prescription drug management.   This patient presents to the ED for concern of headache, this involves an extensive number of treatment options, and is a complaint that carries with it a high risk of complications and morbidity.  The differential diagnosis includes migraine/tension/cluster headache, cerebral venous thrombosis, carotid artery/vertebral artery dissection, malignancy, encephalitis/meningitis   Co morbidities that complicate the patient evaluation  See HPI   Additional history obtained:  Additional history obtained from EMR External records from outside source obtained and reviewed including hospital records   Lab Tests:  I Ordered, and personally interpreted labs.  The pertinent results include: No leukocytosis.  Patient with evidence of anemia with hemoglobin 11.8 of which is normocytic in nature.  No electrolyte abnormalities. No renal dysfunction.   Imaging Studies ordered:  N/a   Cardiac Monitoring: / EKG:  The patient was maintained on a cardiac monitor.  I personally viewed and interpreted the cardiac monitored which showed an underlying rhythm of: Sinus rhythm   Consultations  Obtained:  N/a   Problem List / ED Course / Critical interventions / Medication management  Headache I ordered medication including Lidoderm, Decadron, Benadryl, Toradol, Reglan, normal saline   Reevaluation of the patient after these medicines showed that the patient improved I have reviewed the patients home medicines and have made adjustments as needed   Social Determinants of Health:  Denies tobacco, illicit drug use   Test / Admission - Considered:  Headache Vitals signs within normal range and stable throughout visit. Imaging studies significant for: See above 22 year old female presents emergency department with complaints of headache over the past 8 days or so.  Patient with history of headaches but extensively with reported similar distribution as well as severity of headache experienced in the past.  Patient with nonfocal neurologic exam.  Given similarity of reported symptoms with benign neurologic exam, neuroimaging was foregone.  Patient treated with migraine cocktail and did note significant improvement of symptoms while in the ED.  Will recommend close follow-up with neurologist in the outpatient setting for reevaluation of symptoms.  Treatment plan discussed at length with patient and she acknowledged and seen was agreeable to said plan.  Patient overall well-appearing, afebrile in no acute distress. Worrisome signs and symptoms were discussed with the patient, and the patient acknowledged understanding to return to the ED if noticed. Patient was stable upon discharge.          Final Clinical Impression(s) / ED Diagnoses Final diagnoses:  Acute nonintractable headache, unspecified headache type    Rx / DC Orders ED Discharge Orders     None         Peter Garter, [place] 05/20/23 1857    Coral Spikes, DO 05/20/23 2345

## 2023-10-08 ENCOUNTER — Emergency Department (HOSPITAL_BASED_OUTPATIENT_CLINIC_OR_DEPARTMENT_OTHER)
Admission: EM | Admit: 2023-10-08 | Discharge: 2023-10-08 | Disposition: A | Discharge status: Home / Self Care | Payer: No Typology Code available for payment source | Attending: Emergency Medicine | Admitting: Emergency Medicine

## 2023-10-08 ENCOUNTER — Encounter (HOSPITAL_BASED_OUTPATIENT_CLINIC_OR_DEPARTMENT_OTHER): Payer: Self-pay | Admitting: Emergency Medicine

## 2023-10-08 ENCOUNTER — Other Ambulatory Visit (HOSPITAL_BASED_OUTPATIENT_CLINIC_OR_DEPARTMENT_OTHER): Payer: Self-pay

## 2023-10-08 ENCOUNTER — Other Ambulatory Visit: Payer: Self-pay

## 2023-10-08 DIAGNOSIS — Z20822 Contact with and (suspected) exposure to covid-19: Secondary | ICD-10-CM | POA: Insufficient documentation

## 2023-10-08 DIAGNOSIS — Z7951 Long term (current) use of inhaled steroids: Secondary | ICD-10-CM | POA: Insufficient documentation

## 2023-10-08 DIAGNOSIS — J45909 Unspecified asthma, uncomplicated: Secondary | ICD-10-CM | POA: Insufficient documentation

## 2023-10-08 DIAGNOSIS — Z79899 Other long term (current) drug therapy: Secondary | ICD-10-CM | POA: Diagnosis not present

## 2023-10-08 DIAGNOSIS — J101 Influenza due to other identified influenza virus with other respiratory manifestations: Secondary | ICD-10-CM | POA: Insufficient documentation

## 2023-10-08 DIAGNOSIS — R059 Cough, unspecified: Secondary | ICD-10-CM | POA: Diagnosis present

## 2023-10-08 LAB — RESP PANEL BY RT-PCR (RSV, FLU A&B, COVID)  RVPGX2
Influenza A by PCR: POSITIVE — AB
Influenza B by PCR: NEGATIVE
Resp Syncytial Virus by PCR: NEGATIVE
SARS Coronavirus 2 by RT PCR: NEGATIVE

## 2023-10-08 MED ORDER — IBUPROFEN 600 MG PO TABS
600.0000 mg | ORAL_TABLET | Freq: Four times a day (QID) | ORAL | 0 refills | Status: DC | PRN
  Filled 2023-10-08: qty 30, 8d supply, fill #0

## 2023-10-08 MED ORDER — AEROCHAMBER PLUS FLO-VU MISC
1.0000 | Freq: Once | Status: AC
  Administered 2023-10-08: 1
  Filled 2023-10-08: qty 1

## 2023-10-08 MED ORDER — KETOROLAC TROMETHAMINE 30 MG/ML IJ SOLN
30.0000 mg | Freq: Once | INTRAMUSCULAR | Status: AC
  Administered 2023-10-08: 30 mg via INTRAMUSCULAR
  Filled 2023-10-08: qty 1

## 2023-10-08 MED ORDER — OSELTAMIVIR PHOSPHATE 75 MG PO CAPS
75.0000 mg | ORAL_CAPSULE | Freq: Two times a day (BID) | ORAL | 0 refills | Status: DC
  Filled 2023-10-08: qty 10, 5d supply, fill #0

## 2023-10-08 MED ORDER — ALBUTEROL SULFATE HFA 108 (90 BASE) MCG/ACT IN AERS
1.0000 | INHALATION_SPRAY | RESPIRATORY_TRACT | Status: DC | PRN
  Administered 2023-10-08: 2 via RESPIRATORY_TRACT
  Filled 2023-10-08: qty 6.7

## 2023-10-08 NOTE — ED Triage Notes (Signed)
C/o cough, chills, and body aches x 2 days. Denies SHOB or CP

## 2023-10-08 NOTE — ED Provider Notes (Signed)
[place] EMERGENCY DEPARTMENT AT [facility] Provider Note   CSN: [id number] Arrival date & time: 10/08/23  1232     History  Chief Complaint  Patient presents with   Cough    Alexandra Young is a 23 y.o. female.  Pt is a 23 yo female with pmhx significant for asthma, anxiety, gerd, and depression.  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  She said they tested + for the flu this am.  Pt started getting sx of the flu last night.  She had some wheezes last night, but could not find her inhaler.        Home Medications Prior to Admission medications   Medication Sig Start Date End Date Taking? Authorizing Provider  ibuprofen (ADVIL) 600 MG tablet Take 1 tablet (600 mg total) by mouth every 6 (six) hours as needed. 10/08/23  Yes Jacalyn Lefevre, MD  oseltamivir (TAMIFLU) 75 MG capsule Take 1 capsule (75 mg total) by mouth every 12 (twelve) hours. 10/08/23  Yes Jacalyn Lefevre, MD  albuterol (VENTOLIN HFA) 108 (90 Base) MCG/ACT inhaler Inhale 1-2 puffs into the lungs every 6 (six) hours as needed for wheezing or shortness of breath. 10/08/22   Lamptey, Britta Mccreedy, MD  busPIRone (BUSPAR) 10 MG tablet Take 10 mg by mouth 2 (two) times daily.    [provider]  butalbital-acetaminophen-caffeine (FIORICET) 50-325-40 MG tablet Take 1 tablet by mouth every 6 (six) hours as needed for migraine. 07/02/19   [provider]  cetirizine (ZYRTEC) 10 MG tablet Take 10 mg by mouth 2 (two) times daily.    [provider]  etonogestrel (NEXPLANON) 68 MG IMPL implant 68 mg by Subdermal route continuous.    [provider]  fluticasone (FLONASE) 50 MCG/ACT nasal spray Place 2 sprays into both nostrils daily as needed for allergies.    [provider]  hydrOXYzine (ATARAX) 25 MG tablet Take 1 tablet (25 mg total) by mouth every 8 (eight) hours as needed. 10/08/22   Merrilee Jansky, MD  ibuprofen (ADVIL) 600 MG tablet Take 1 tablet  (600 mg total) by mouth every 6 (six) hours as needed. 10/08/22   Merrilee Jansky, MD  linaclotide Karlene Einstein) 145 MCG CAPS capsule Take 145 mcg by mouth daily as needed (constipation).    [provider]  montelukast (SINGULAIR) 10 MG tablet Take 10 mg by mouth at bedtime. 11/06/19   [provider]  ondansetron (ZOFRAN ODT) 4 MG disintegrating tablet Take 1 tablet (4 mg total) by mouth every 8 (eight) hours as needed for nausea or vomiting. 05/19/21   Tanda Rockers, PA-C  traZODone (DESYREL) 50 MG tablet Take 50 mg by mouth at bedtime as needed for sleep. 12/12/20 06/13/22  [provider]  triamcinolone cream (KENALOG) 0.1 % Apply 1 application topically 2 (two) times daily. Apply to affected areas from neck down 01/21/21   Danford, Earl Lites, MD  Vitamin D, Ergocalciferol, (DRISDOL) 1.25 MG (50000 UNIT) CAPS capsule Take 50,000 Units by mouth every 7 (seven) days.    [provider]  medroxyPROGESTERone (DEPO-PROVERA) 150 MG/ML injection Inject 150 mg into the muscle every 3 (three) months.  05/20/20  [provider]      Allergies    Lamotrigine    Review of Systems   Review of Systems  Respiratory:  Positive for cough, shortness of breath and wheezing.   All other systems reviewed and are negative.   Physical Exam Updated  Vital Signs BP 134/74 (BP Location: Right Arm)   Pulse 87   Temp 98.7 F (37.1 C) (Oral)   Resp 18   Ht 5\' 2"  (1.575 m)   Wt 49 kg   SpO2 98%   BMI 19.75 kg/m  Physical Exam Vitals and nursing note reviewed.  Constitutional:      Appearance: Normal appearance.  HENT:     Head: Normocephalic and atraumatic.     Right Ear: External ear normal.     Left Ear: External ear normal.     Nose: Nose normal.     Mouth/Throat:     Mouth: Mucous membranes are moist.     Pharynx: Oropharynx is clear.  Eyes:     Extraocular Movements: Extraocular movements intact.     Conjunctiva/sclera: Conjunctivae normal.     Pupils:  Pupils are equal, round, and reactive to light.  Cardiovascular:     Rate and Rhythm: Normal rate and regular rhythm.     Pulses: Normal pulses.     Heart sounds: Normal heart sounds.  Pulmonary:     Breath sounds: Wheezing present.  Abdominal:     General: Abdomen is flat. Bowel sounds are normal.     Palpations: Abdomen is soft.  Musculoskeletal:        General: Normal range of motion.     Cervical back: Normal range of motion and neck supple.  Skin:    General: Skin is warm.     Capillary Refill: Capillary refill takes less than 2 seconds.  Neurological:     General: No focal deficit present.     Mental Status: She is alert and oriented to person, place, and time.  Psychiatric:        Mood and Affect: Mood normal.        Behavior: Behavior normal.     ED Results / Procedures / Treatments   Labs (all labs ordered are listed, but only abnormal results are displayed) Labs Reviewed  RESP PANEL BY RT-PCR (RSV, FLU A&B, COVID)  RVPGX2 - Abnormal; Notable for the following components:      Result Value   Influenza A by PCR POSITIVE (*)    All other components within normal limits    EKG None  Radiology No results found.  Procedures Procedures    Medications Ordered in ED Medications  ketorolac (TORADOL) 30 MG/ML injection 30 mg (has no administration in time range)  albuterol (VENTOLIN HFA) 108 (90 Base) MCG/ACT inhaler 1-2 puff (has no administration in time range)  Aerochamber Plus device 1 each (has no administration in time range)    ED Course/ Medical Decision Making/ A&P                                 Medical Decision Making Risk Prescription drug management.   This patient presents to the ED for concern of cough, cold sx, this involves an extensive number of treatment options, and is a complaint that carries with it a high risk of complications and morbidity.  The differential diagnosis includes flu/covid/rsv   Co morbidities that complicate the  patient evaluation  asthma, anxiety, gerd, and depression   Additional history obtained:  Additional history obtained from epic chart review  Lab Tests:  I Ordered, and personally interpreted labs.  The pertinent results include:  covid/rsv neg; Flu A +  Medicines ordered and prescription drug management:  I ordered medication including toradol/albuterol  for sx  Reevaluation of the patient after these medicines showed that the patient improved I have reviewed the patients home medicines and have made adjustments as needed  Problem List / ED Course:  Influenza A:  pt's vitals are nl.  She is stable for d/c.  Return if worse.  F/u with pcp.  Social Determinants of Health:  Lives at home   Dispostion:  After consideration of the diagnostic results and the patients response to treatment, I feel that the patent would benefit from discharge with outpatient f/u.          Final Clinical Impression(s) / ED Diagnoses Final diagnoses:  Influenza A    Rx / DC Orders ED Discharge Orders          Ordered    oseltamivir (TAMIFLU) 75 MG capsule  Every 12 hours        10/08/23 1437    ibuprofen (ADVIL) 600 MG tablet  Every 6 hours PRN        10/08/23 1437              Jacalyn Lefevre, MD 10/08/23 1443

## 2023-10-09 ENCOUNTER — Ambulatory Visit (HOSPITAL_COMMUNITY): Payer: Self-pay

## 2023-11-06 ENCOUNTER — Ambulatory Visit (INDEPENDENT_AMBULATORY_CARE_PROVIDER_SITE_OTHER): Payer: No Typology Code available for payment source

## 2023-11-06 ENCOUNTER — Ambulatory Visit
Admission: EM | Admit: 2023-11-06 | Discharge: 2023-11-06 | Disposition: A | Discharge status: Home / Self Care | Payer: No Typology Code available for payment source

## 2023-11-06 ENCOUNTER — Other Ambulatory Visit: Payer: Self-pay

## 2023-11-06 DIAGNOSIS — M25522 Pain in left elbow: Secondary | ICD-10-CM

## 2023-11-06 DIAGNOSIS — M545 Low back pain, unspecified: Secondary | ICD-10-CM | POA: Diagnosis not present

## 2023-11-06 DIAGNOSIS — M25552 Pain in left hip: Secondary | ICD-10-CM

## 2023-11-06 MED ORDER — PREDNISONE 20 MG PO TABS: 40.0000 mg | ORAL_TABLET | Freq: Every day | ORAL | 0 refills | Status: DC

## 2023-11-06 MED ORDER — CYCLOBENZAPRINE HCL 10 MG PO TABS: 10.0000 mg | ORAL_TABLET | Freq: Two times a day (BID) | ORAL | 0 refills | Status: AC | PRN

## 2023-11-06 NOTE — ED Triage Notes (Signed)
 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 C/o pain left side pain, hip and elbow are the worst. Denies LOC. Ambulated to triage without difficulty.

## 2023-11-08 ENCOUNTER — Encounter (HOSPITAL_BASED_OUTPATIENT_CLINIC_OR_DEPARTMENT_OTHER): Payer: Self-pay | Admitting: Urology

## 2023-11-08 ENCOUNTER — Emergency Department (HOSPITAL_BASED_OUTPATIENT_CLINIC_OR_DEPARTMENT_OTHER)
Admission: EM | Admit: 2023-11-08 | Discharge: 2023-11-09 | Disposition: A | Discharge status: Home / Self Care | Payer: No Typology Code available for payment source | Attending: Emergency Medicine | Admitting: Emergency Medicine

## 2023-11-08 ENCOUNTER — Emergency Department (HOSPITAL_BASED_OUTPATIENT_CLINIC_OR_DEPARTMENT_OTHER): Payer: No Typology Code available for payment source

## 2023-11-08 ENCOUNTER — Other Ambulatory Visit: Payer: Self-pay

## 2023-11-08 DIAGNOSIS — S299XXA Unspecified injury of thorax, initial encounter: Secondary | ICD-10-CM | POA: Diagnosis not present

## 2023-11-08 DIAGNOSIS — W19XXXA Unspecified fall, initial encounter: Secondary | ICD-10-CM

## 2023-11-08 DIAGNOSIS — S298XXA Other specified injuries of thorax, initial encounter: Secondary | ICD-10-CM

## 2023-11-08 DIAGNOSIS — S39012A Strain of muscle, fascia and tendon of lower back, initial encounter: Secondary | ICD-10-CM | POA: Insufficient documentation

## 2023-11-08 DIAGNOSIS — W108XXA Fall (on) (from) other stairs and steps, initial encounter: Secondary | ICD-10-CM | POA: Diagnosis not present

## 2023-11-08 DIAGNOSIS — S3992XA Unspecified injury of lower back, initial encounter: Secondary | ICD-10-CM | POA: Diagnosis present

## 2023-11-08 LAB — PREGNANCY, URINE: Preg Test, Ur: NEGATIVE

## 2023-11-08 NOTE — ED Triage Notes (Signed)
 Pt states fell down 10 steps on Wednesday am  Was seen at [facility] on 12/26  Pt states left side hip and left elbow, states continued left shoulder pain and left side rib pain that is worse with cough and deep  breathing  Also states lower back pain

## 2023-11-09 NOTE — ED Provider Notes (Signed)
 [facility] Provider Note   CSN: [id number] Arrival date & time: 11/08/23  2002     History  Chief Complaint  Patient presents with   Marletta Lor    Alexandra Young is a 23 y.o. female.  The history is provided by the patient.  Patient reports she had a fall around February 26.  Patient reports she slipped due to her socks and fell down steps.  No head injury or LOC.  She mostly injured her back.  She was seen in urgent care but since that time her pain is worsened.  Most the pain is now in her low back and in her ribs.  No new abdominal pain or vomiting.  No significant headache or neck pain.  She has been ambulatory.     Home Medications Prior to Admission medications   Medication Sig Start Date End Date Taking? Authorizing Provider  albuterol (VENTOLIN HFA) 108 (90 Base) MCG/ACT inhaler Inhale 1-2 puffs into the lungs every 6 (six) hours as needed for wheezing or shortness of breath. 10/08/22   Lamptey, Britta Mccreedy, MD  azelastine (ASTELIN) 0.1 % nasal spray Place into the nose. 01/17/22   [provider]  busPIRone (BUSPAR) 10 MG tablet Take 10 mg by mouth 2 (two) times daily. Patient not taking: Reported on 11/06/2023    [provider]  butalbital-acetaminophen-caffeine (FIORICET) 50-325-40 MG tablet Take 1 tablet by mouth every 6 (six) hours as needed for migraine. 07/02/19   [provider]  cetirizine (ZYRTEC) 10 MG tablet Take 10 mg by mouth 2 (two) times daily.    [provider]  cyclobenzaprine (FLEXERIL) 10 MG tablet Take 1 tablet (10 mg total) by mouth 2 (two) times daily as needed for muscle spasms. 11/06/23   Tomi Bamberger, PA-C  etonogestrel (NEXPLANON) 68 MG IMPL implant 68 mg by Subdermal route continuous. Patient not taking: Reported on 11/06/2023    [provider]  fluticasone (FLONASE) 50 MCG/ACT nasal spray Place 2 sprays into both nostrils daily as needed for allergies.     [provider]  Galcanezumab-gnlm (EMGALITY) 120 MG/ML SOSY Inject into the skin. 09/02/23   [provider]  linaclotide (LINZESS) 145 MCG CAPS capsule Take 145 mcg by mouth daily as needed (constipation). Patient not taking: Reported on 11/06/2023    [provider]  montelukast (SINGULAIR) 10 MG tablet Take 10 mg by mouth at bedtime. Patient not taking: Reported on 11/06/2023 11/06/19   [provider]  omeprazole (PRILOSEC) 40 MG capsule 40 mg per 1 capsule, ORAL, Daily, 30 minutes before breakfast 09/02/23   [provider]  ondansetron (ZOFRAN ODT) 4 MG disintegrating tablet Take 1 tablet (4 mg total) by mouth every 8 (eight) hours as needed for nausea or vomiting. 05/19/21   Hyman Hopes, Margaux, PA-C  triamcinolone cream (KENALOG) 0.1 % Apply 1 application topically 2 (two) times daily. Apply to affected areas from neck down Patient not taking: Reported on 11/06/2023 01/21/21   Alberteen Sam, MD  Vitamin D, Ergocalciferol, (DRISDOL) 1.25 MG (50000 UNIT) CAPS capsule Take 50,000 Units by mouth every 7 (seven) days.    [provider]  medroxyPROGESTERone (DEPO-PROVERA) 150 MG/ML injection Inject 150 mg into the muscle every 3 (three) months.  05/20/20  [provider]      Allergies    Lamotrigine    Review of Systems   Review of Systems  Respiratory:  Negative for shortness of breath.   Gastrointestinal:  Negative for vomiting.  Musculoskeletal:  Positive for arthralgias and back pain.    Physical Exam Updated Vital Signs BP 126/87   Pulse 70   Temp 98.3 F (36.8 C) (Oral)   Resp 17   Ht 1.575 m (5\' 2" )   Wt 49 kg   LMP 09/25/2023   SpO2 100%   BMI 19.76 kg/m  Physical Exam CONSTITUTIONAL: Well developed/well nourished HEAD: Normocephalic/atraumatic, no visible trauma EYES: EOMI/PERRL ENMT: Mucous membranes moist, no visible facial trauma NECK: supple no meningeal signs SPINE/BACK: No cervical or thoracic  tenderness, mild lumbar spinal paraspinal tenderness No bruising/crepitance/stepoffs noted to spine CV: S1/S2 noted, no murmurs/rubs/gallops noted LUNGS: Lungs are clear to auscultation bilaterally, no apparent distress Chest-mild tenderness noted left lateral chest, no crepitus or bruising ABDOMEN: soft, nontender, no LUQ or RUQ tenderness GU:no cva tenderness NEURO: Pt is awake/alert/appropriate, moves all extremitiesx4.  No facial droop.  Moves all extremities without difficulty EXTREMITIES: pulses normal/equal, full ROM All other extremities/joints palpated/ranged and nontender SKIN: warm, color normal PSYCH: Anxious  ED Results / Procedures / Treatments   Labs (all labs ordered are listed, but only abnormal results are displayed) Labs Reviewed  PREGNANCY, URINE    EKG None  Radiology DG Shoulder Left Result Date: 11/08/2023 CLINICAL DATA:  Larey Seat down stairs 2 days ago.  Left-sided pain. EXAM: LEFT SHOULDER - 2+ VIEW COMPARISON:  None Available. FINDINGS: There is no evidence of fracture or dislocation. There is no evidence of arthropathy or other focal bone abnormality. Soft tissues are unremarkable. IMPRESSION: Negative. Electronically Signed   By: Burman Nieves M.D.   On: 11/08/2023 22:59   DG Ribs Unilateral W/Chest Left Result Date: 11/08/2023 CLINICAL DATA:  Left-sided pain after fall down stairs 2 days ago. EXAM: LEFT RIBS AND CHEST - 3+ VIEW COMPARISON:  01/16/2021 FINDINGS: Normal heart size and pulmonary vascularity. No focal airspace disease or consolidation in the lungs. No blunting of costophrenic angles. No pneumothorax. Mediastinal contours appear intact. Left ribs appear intact. No acute displaced fractures are identified. No focal bone lesion or bone destruction. Soft tissues are unremarkable. IMPRESSION: No evidence of active pulmonary disease.  Negative left ribs. Electronically Signed   By: Burman Nieves M.D.   On: 11/08/2023 22:58   DG Lumbar Spine  Complete Result Date: 11/08/2023 CLINICAL DATA:  Larey Seat down stairs 2 days ago.  Low back pain EXAM: LUMBAR SPINE - COMPLETE 4+ VIEW COMPARISON:  11/24/2018 FINDINGS: Five lumbar type vertebral bodies. Mild lumbar scoliosis convex towards the right. No anterior subluxations. Normal alignment of the facet joints. No vertebral compression deformities. No focal bone lesion or bone destruction. Bone cortex appears intact. Visualized sacrum appears intact. IMPRESSION: Mild lumbar scoliosis convex towards the right. No acute bony abnormalities. Electronically Signed   By: Burman Nieves M.D.   On: 11/08/2023 22:57   DG Hip Unilat W or Wo Pelvis 2-3 Views Left Result Date: 11/08/2023 CLINICAL DATA:  Larey Seat down stairs 2 days ago.  Left-sided pain. EXAM: DG HIP (WITH OR WITHOUT PELVIS) 2-3V LEFT COMPARISON:  None Available. FINDINGS: There is no evidence of hip fracture or dislocation. There is no evidence of arthropathy or other focal bone abnormality. IMPRESSION: Negative. Electronically Signed   By: Burman Nieves M.D.   On: 11/08/2023 22:56    Procedures Procedures    Medications Ordered in ED Medications - No data to display  ED Course/ Medical Decision Making/ A&P           Glasgow Coma Scale  Score: 15      NEXUS Criteria Score: 0                Medical Decision Making  Patient presents for evaluation over 48 hours after a fall.  No signs of any head injury or neck injury.  All imaging that was completed has been reviewed.  No acute fractures.  Patient has been ambulatory.  I reviewed urgent care notes.  Advised patient to avoid prednisone, and take ibuprofen 3 times daily for a week.  She can continue Flexeril as needed for muscle spasms. No other signs of acute traumatic injury        Final Clinical Impression(s) / ED Diagnoses Final diagnoses:  Fall, initial encounter  Blunt trauma to chest, initial encounter  Strain of lumbar region, initial encounter    Rx / DC Orders ED  Discharge Orders     None         Zadie Rhine, MD 11/09/23 0206

## 2023-11-09 NOTE — Discharge Instructions (Signed)
 You can take ibuprofen 400 mg 3 times a day for a week for your pain You can stop the prednisone.  You can take the Flexeril at night for muscle spasms

## 2023-11-09 NOTE — ED Notes (Signed)
 Discharge instructions provided by edp was reviewed with pt. Pt verbalized understanding with no additional questions at this time.

## 2023-11-09 NOTE — ED Notes (Signed)
 Asked to place an IV. I asked what room and heard room 11 and verified.but I heard wrong. Iv was not placed. Patient was apologized to.

## 2023-11-10 ENCOUNTER — Encounter: Payer: Self-pay | Admitting: Physician Assistant

## 2023-11-10 NOTE — ED Provider Notes (Signed)
 [facility]    CSN: [id number] Arrival date & time: 11/06/23  1108      History   Chief Complaint Chief Complaint  Patient presents with   Fall    HPI Alexandra Young is a 23 y.o. female.   Patient presents today for evaluation of left hip and left elbow pain after falling down 10 steps earlier today.  She did not hit her head and denies any LOC.  She reports she was holding a baby when she fell.  She is able to ambulate without difficulty.  She denies any numbness or tingling.  She does not report treatment for symptoms.  She does note that she is developing some left-sided low back and thoracic back pain that has been gradual in onset.  She denies immediate back pain after fall.  The history is provided by the patient.  Fall Pertinent negatives include no abdominal pain, no headaches and no shortness of breath.    Past Medical History:  Diagnosis Date   Anxiety    Asthma    Depression    DRESS syndrome 01/12/2021   GERD (gastroesophageal reflux disease)    IBS (irritable bowel syndrome)    Migraine    per pt    Patient Active Problem List   Diagnosis Date Noted   External hemorrhoid 03/29/2022   Constipation 03/29/2022   DRESS syndrome 01/12/2021   Bipolar affective disorder, current episode mixed (HCC)    Attention deficit disorder    Elevated LFTs    Epigastric pain    Left flank pain    Rash    Endocarditis of tricuspid valve    Transaminitis    Intractable pain 01/08/2021   Pelvic pain 01/08/2021   Fever 01/08/2021   Splenomegaly 01/08/2021   Intractable nausea and vomiting 01/07/2021    Past Surgical History:  Procedure Laterality Date   BONE MARROW BIOPSY     SUBTALAR COALITION EXCISION Left 06/21/2022   Procedure: Left foot calcaneonavicular coalition excision;  Surgeon: Toni Arthurs, MD;  Location: [facility];  Service: Orthopedics;  Laterality: Left;   TEE WITHOUT CARDIOVERSION N/A 01/12/2021   Procedure:  TRANSESOPHAGEAL ECHOCARDIOGRAM (TEE);  Surgeon: Jake Bathe, MD;  Location: [facility] ENDOSCOPY;  Service: Cardiovascular;  Laterality: N/A;    OB History   No obstetric history on file.      Home Medications    Prior to Admission medications   Medication Sig Start Date End Date Taking? Authorizing Provider  albuterol (VENTOLIN HFA) 108 (90 Base) MCG/ACT inhaler Inhale 1-2 puffs into the lungs every 6 (six) hours as needed for wheezing or shortness of breath. 10/08/22  Yes Lamptey, Britta Mccreedy, MD  azelastine (ASTELIN) 0.1 % nasal spray Place into the nose. 01/17/22  Yes [provider]  butalbital-acetaminophen-caffeine (FIORICET) 50-325-40 MG tablet Take 1 tablet by mouth every 6 (six) hours as needed for migraine. 07/02/19  Yes [provider]  cetirizine (ZYRTEC) 10 MG tablet Take 10 mg by mouth 2 (two) times daily.   Yes [provider]  cyclobenzaprine (FLEXERIL) 10 MG tablet Take 1 tablet (10 mg total) by mouth 2 (two) times daily as needed for muscle spasms. 11/06/23  Yes Tomi Bamberger, PA-C  fluticasone [facility]) 50 MCG/ACT nasal spray Place 2 sprays into both nostrils daily as needed for allergies.   Yes [provider]  Galcanezumab-gnlm (EMGALITY) 120 MG/ML SOSY Inject into the skin. 09/02/23  Yes [provider]  omeprazole (PRILOSEC) 40 MG capsule 40  mg per 1 capsule, ORAL, Daily, 30 minutes before breakfast 09/02/23  Yes [provider]  ondansetron (ZOFRAN ODT) 4 MG disintegrating tablet Take 1 tablet (4 mg total) by mouth every 8 (eight) hours as needed for nausea or vomiting. 05/19/21  Yes Venter, Margaux, PA-C  Vitamin D, Ergocalciferol, (DRISDOL) 1.25 MG (50000 UNIT) CAPS capsule Take 50,000 Units by mouth every 7 (seven) days.   Yes [provider]  busPIRone (BUSPAR) 10 MG tablet Take 10 mg by mouth 2 (two) times daily. Patient not taking: Reported on 11/06/2023    [provider]  etonogestrel (NEXPLANON) 68  MG IMPL implant 68 mg by Subdermal route continuous. Patient not taking: Reported on 11/06/2023    [provider]  linaclotide (LINZESS) 145 MCG CAPS capsule Take 145 mcg by mouth daily as needed (constipation). Patient not taking: Reported on 11/06/2023    [provider]  montelukast (SINGULAIR) 10 MG tablet Take 10 mg by mouth at bedtime. Patient not taking: Reported on 11/06/2023 11/06/19   [provider]  triamcinolone cream (KENALOG) 0.1 % Apply 1 application topically 2 (two) times daily. Apply to affected areas from neck down Patient not taking: Reported on 11/06/2023 01/21/21   Alberteen Sam, MD  medroxyPROGESTERone (DEPO-PROVERA) 150 MG/ML injection Inject 150 mg into the muscle every 3 (three) months.  05/20/20  [provider]    Family History Family History  Problem Relation Age of Onset   Asthma Mother    Thyroid disease Mother    Cancer Mother    Diabetes Mother    Diabetes Father    Migraines Father    Depression Sister     Social History Social History   Tobacco Use   Smoking status: Never   Smokeless tobacco: Never  Vaping Use   Vaping status: Never Used  Substance Use Topics   Alcohol use: Yes    Comment: socially   Drug use: Yes    Types: Marijuana    Comment: edibles occasionally     Allergies   Lamotrigine   Review of Systems Review of Systems  Constitutional:  Negative for chills and fever.  Eyes:  Negative for discharge and redness.  Respiratory:  Negative for shortness of breath.   Gastrointestinal:  Negative for abdominal pain, nausea and vomiting.  Musculoskeletal:  Positive for back pain and myalgias.  Neurological:  Negative for numbness and headaches.     Physical Exam Triage Vital Signs ED Triage Vitals  Encounter Vitals Group     BP 11/06/23 1225 132/71     Systolic BP Percentile --      Diastolic BP Percentile --      Pulse Rate 11/06/23 1225 76     Resp 11/06/23 1225 16     Temp  11/06/23 1225 98.4 F (36.9 C)     Temp Source 11/06/23 1225 Oral     SpO2 11/06/23 1225 99 %     Weight --      Height --      Head Circumference --      Peak Flow --      Pain Score 11/06/23 1218 7     Pain Loc --      Pain Education --      Exclude from Growth Chart --    No data found.  Updated Vital Signs BP 132/71 (BP Location: Left Arm)   Pulse 76   Temp 98.4 F (36.9 C) (Oral)   Resp 16  LMP 09/25/2023   SpO2 99%      Physical Exam Vitals and nursing note reviewed.  Constitutional:      General: She is not in acute distress.    Appearance: Normal appearance. She is not ill-appearing.  HENT:     Head: Normocephalic and atraumatic.  Eyes:     Conjunctiva/sclera: Conjunctivae normal.  Cardiovascular:     Rate and Rhythm: Normal rate.  Pulmonary:     Effort: Pulmonary effort is normal. No respiratory distress.  Musculoskeletal:     Comments: Normal range of motion of left elbow.  No significant swelling noted.  Normal gait.  Mild tenderness palpation noted to left low back and left thoracic back without tenderness palpation noted to midline spine.  Neurological:     Mental Status: She is alert.  Psychiatric:        Mood and Affect: Mood normal.        Behavior: Behavior normal.        Thought Content: Thought content normal.      UC Treatments / Results  Labs (all labs ordered are listed, but only abnormal results are displayed) Labs Reviewed - No data to display  EKG   Radiology DG Shoulder Left Result Date: 11/08/2023 CLINICAL DATA:  Larey Seat down stairs 2 days ago.  Left-sided pain. EXAM: LEFT SHOULDER - 2+ VIEW COMPARISON:  None Available. FINDINGS: There is no evidence of fracture or dislocation. There is no evidence of arthropathy or other focal bone abnormality. Soft tissues are unremarkable. IMPRESSION: Negative. Electronically Signed   By: Burman Nieves M.D.   On: 11/08/2023 22:59   DG Ribs Unilateral W/Chest Left Result Date:  11/08/2023 CLINICAL DATA:  Left-sided pain after fall down stairs 2 days ago. EXAM: LEFT RIBS AND CHEST - 3+ VIEW COMPARISON:  01/16/2021 FINDINGS: Normal heart size and pulmonary vascularity. No focal airspace disease or consolidation in the lungs. No blunting of costophrenic angles. No pneumothorax. Mediastinal contours appear intact. Left ribs appear intact. No acute displaced fractures are identified. No focal bone lesion or bone destruction. Soft tissues are unremarkable. IMPRESSION: No evidence of active pulmonary disease.  Negative left ribs. Electronically Signed   By: Burman Nieves M.D.   On: 11/08/2023 22:58   DG Lumbar Spine Complete Result Date: 11/08/2023 CLINICAL DATA:  Larey Seat down stairs 2 days ago.  Low back pain EXAM: LUMBAR SPINE - COMPLETE 4+ VIEW COMPARISON:  11/24/2018 FINDINGS: Five lumbar type vertebral bodies. Mild lumbar scoliosis convex towards the right. No anterior subluxations. Normal alignment of the facet joints. No vertebral compression deformities. No focal bone lesion or bone destruction. Bone cortex appears intact. Visualized sacrum appears intact. IMPRESSION: Mild lumbar scoliosis convex towards the right. No acute bony abnormalities. Electronically Signed   By: Burman Nieves M.D.   On: 11/08/2023 22:57   DG Hip Unilat W or Wo Pelvis 2-3 Views Left Result Date: 11/08/2023 CLINICAL DATA:  Larey Seat down stairs 2 days ago.  Left-sided pain. EXAM: DG HIP (WITH OR WITHOUT PELVIS) 2-3V LEFT COMPARISON:  None Available. FINDINGS: There is no evidence of hip fracture or dislocation. There is no evidence of arthropathy or other focal bone abnormality. IMPRESSION: Negative. Electronically Signed   By: Burman Nieves M.D.   On: 11/08/2023 22:56    Procedures Procedures (including critical care time)  Medications Ordered in UC Medications - No data to display  Initial Impression / Assessment and Plan / UC Course  I have reviewed the triage vital signs and the  nursing  notes.  Pertinent labs & imaging results that were available during my care of the patient were reviewed by me and considered in my medical decision making (see chart for details).    Elbow xray without abnormality. Discussed low suspicion for fracture elsewhere given presentation. Other imaging deferred at this time. Will treat with steroid burst and muscle relaxer. Patient denies risk of pregnancy. Recommended further evaluation in the ED with any worsening symptoms or further concerns.   Final Clinical Impressions(s) / UC Diagnoses   Final diagnoses:  Left elbow pain  Left hip pain  Acute left-sided low back pain without sciatica   Discharge Instructions   None    ED Prescriptions     Medication Sig Dispense Auth. Provider   predniSONE (DELTASONE) 20 MG tablet Take 2 tablets (40 mg total) by mouth daily with breakfast for 5 days. 10 tablet Erma Pinto F, PA-C   cyclobenzaprine (FLEXERIL) 10 MG tablet Take 1 tablet (10 mg total) by mouth 2 (two) times daily as needed for muscle spasms. 20 tablet Tomi Bamberger, PA-C      PDMP not reviewed this encounter.   Tomi Bamberger, PA-C 11/10/23 1444

## 2024-04-14 ENCOUNTER — Other Ambulatory Visit: Payer: Self-pay

## 2024-04-14 ENCOUNTER — Ambulatory Visit
Admission: RE | Admit: 2024-04-14 | Discharge: 2024-04-14 | Disposition: A | Discharge status: Home / Self Care | Attending: Physician Assistant | Admitting: Physician Assistant

## 2024-04-14 VITALS — BP 113/74 | HR 102 | Temp 98.7°F | Resp 18 | Ht 62.0 in | Wt 120.0 lb

## 2024-04-14 DIAGNOSIS — R112 Nausea with vomiting, unspecified: Secondary | ICD-10-CM | POA: Diagnosis not present

## 2024-04-14 DIAGNOSIS — J029 Acute pharyngitis, unspecified: Secondary | ICD-10-CM | POA: Insufficient documentation

## 2024-04-14 DIAGNOSIS — B9789 Other viral agents as the cause of diseases classified elsewhere: Secondary | ICD-10-CM | POA: Insufficient documentation

## 2024-04-14 DIAGNOSIS — J45909 Unspecified asthma, uncomplicated: Secondary | ICD-10-CM | POA: Diagnosis not present

## 2024-04-14 DIAGNOSIS — K589 Irritable bowel syndrome without diarrhea: Secondary | ICD-10-CM | POA: Insufficient documentation

## 2024-04-14 DIAGNOSIS — J069 Acute upper respiratory infection, unspecified: Secondary | ICD-10-CM | POA: Insufficient documentation

## 2024-04-14 LAB — POCT RAPID STREP A (OFFICE): Rapid Strep A Screen: NEGATIVE

## 2024-04-14 LAB — POC SOFIA SARS ANTIGEN FIA: SARS Coronavirus 2 Ag: NEGATIVE

## 2024-04-14 MED ORDER — PROMETHAZINE-DM 6.25-15 MG/5ML PO SYRP: 5.0000 mL | ORAL_SOLUTION | Freq: Four times a day (QID) | ORAL | 0 refills | Status: AC | PRN

## 2024-04-14 NOTE — ED Provider Notes (Addendum)
 [facility]    CSN: [id number] Arrival date & time: 04/14/24  1202      History   Chief Complaint Chief Complaint  Patient presents with   Cough    Cough with mucus scratching throat - Entered by patient   Sore Throat    HPI Alexandra Young is a 23 y.o. female.   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  She denies recent travel  Interventions: none    Past Medical History:  Diagnosis Date   Anxiety    Asthma    Depression    DRESS syndrome 01/12/2021   GERD (gastroesophageal reflux disease)    IBS (irritable bowel syndrome)    Migraine    per pt    Patient Active Problem List   Diagnosis Date Noted   External hemorrhoid 03/29/2022   Constipation 03/29/2022   DRESS syndrome 01/12/2021   Bipolar affective disorder, current episode mixed (HCC)    Attention deficit disorder    Elevated LFTs    Epigastric pain    Left flank pain    Rash    Endocarditis of tricuspid valve    Transaminitis    Intractable pain 01/08/2021   Pelvic pain 01/08/2021   Fever 01/08/2021   Splenomegaly 01/08/2021   Intractable nausea and vomiting 01/07/2021    Past Surgical History:  Procedure Laterality Date   BONE MARROW BIOPSY     SUBTALAR COALITION EXCISION Left 06/21/2022   Procedure: Left foot calcaneonavicular coalition excision;  Surgeon: Kit Rush, MD;  Location: [facility];  Service: Orthopedics;  Laterality: Left;   TEE WITHOUT CARDIOVERSION N/A 01/12/2021   Procedure: TRANSESOPHAGEAL ECHOCARDIOGRAM (TEE);  Surgeon: Jeffrie Oneil BROCKS, MD;  Location: [facility];  Service: Cardiovascular;  Laterality:  N/A;    OB History   No obstetric history on file.      Home Medications    Prior to Admission medications   Medication Sig Start Date End Date Taking? Authorizing Provider  promethazine -dextromethorphan (PROMETHAZINE -DM) 6.25-15 MG/5ML syrup Take 5 mLs by mouth 4 (four) times daily as needed. 04/14/24  Yes Issaiah Seabrooks E, PA-C  albuterol  (VENTOLIN  HFA) 108 (90 Base) MCG/ACT inhaler Inhale 1-2 puffs into the lungs every 6 (six) hours as needed for wheezing or shortness of breath. 10/08/22   Lamptey, Aleene KIDD, MD  azelastine (ASTELIN) 0.1 % nasal spray Place into the nose. 01/17/22   [provider]  busPIRone  (BUSPAR ) 10 MG tablet Take 10 mg by mouth 2 (two) times daily. Patient not taking: Reported on 11/06/2023    [provider]  butalbital-acetaminophen -caffeine (FIORICET) 50-325-40 MG tablet Take 1 tablet by mouth every 6 (six) hours as needed for migraine. 07/02/19   [provider]  cetirizine (ZYRTEC) 10 MG tablet Take 10 mg by mouth 2 (two) times daily.    [provider]  cyclobenzaprine  (FLEXERIL ) 10 MG tablet Take 1 tablet (10 mg total) by mouth 2 (two) times daily as needed for muscle spasms. 11/06/23   Billy Asberry FALCON, PA-C  etonogestrel (NEXPLANON) 68 MG IMPL implant 68 mg by Subdermal route continuous. Patient not taking: Reported on 11/06/2023  [provider]  fluticasone  (FLONASE ) 50 MCG/ACT nasal spray Place 2 sprays into both nostrils daily as needed for allergies.    [provider]  Galcanezumab-gnlm (EMGALITY) 120 MG/ML SOSY Inject into the skin. 09/02/23   [provider]  linaclotide  (LINZESS ) 145 MCG CAPS capsule Take 145 mcg by mouth daily as needed (constipation). Patient not taking: Reported on 11/06/2023    [provider]  montelukast  (SINGULAIR ) 10 MG tablet Take 10 mg by mouth at bedtime. Patient not taking: Reported on 11/06/2023 11/06/19   [provider]  omeprazole (PRILOSEC) 40  MG capsule 40 mg per 1 capsule, ORAL, Daily, 30 minutes before breakfast 09/02/23   [provider]  ondansetron  (ZOFRAN  ODT) 4 MG disintegrating tablet Take 1 tablet (4 mg total) by mouth every 8 (eight) hours as needed for nausea or vomiting. 05/19/21   Shepard, Margaux, PA-C  triamcinolone  cream (KENALOG ) 0.1 % Apply 1 application topically 2 (two) times daily. Apply to affected areas from neck down Patient not taking: Reported on 11/06/2023 01/21/21   Jonel Lonni SQUIBB, MD  Vitamin D, Ergocalciferol, (DRISDOL) 1.25 MG (50000 UNIT) CAPS capsule Take 50,000 Units by mouth every 7 (seven) days.    [provider]  medroxyPROGESTERone (DEPO-PROVERA) 150 MG/ML injection Inject 150 mg into the muscle every 3 (three) months.  05/20/20  [provider]    Family History Family History  Problem Relation Age of Onset   Asthma Mother    Thyroid disease Mother    Cancer Mother    Diabetes Mother    Diabetes Father    Migraines Father    Depression Sister     Social History Social History   Tobacco Use   Smoking status: Never   Smokeless tobacco: Never  Vaping Use   Vaping status: Never Used  Substance Use Topics   Alcohol use: Yes    Comment: socially   Drug use: Yes    Types: Marijuana    Comment: edibles occasionally     Allergies   Lamotrigine    Review of Systems Review of Systems  Constitutional:  Positive for fatigue. Negative for chills and fever.  HENT:  Positive for congestion, ear pain (mild in the left ear), postnasal drip, rhinorrhea and sore throat.   Respiratory:  Positive for cough and wheezing. Negative for shortness of breath.   Gastrointestinal:  Positive for diarrhea, nausea and vomiting.  Musculoskeletal:  Negative for myalgias.  Neurological:  Positive for headaches. Negative for dizziness and light-headedness.     Physical Exam Triage Vital Signs ED Triage Vitals [04/14/24 1217]  Encounter Vitals Group     BP 113/74      Girls Systolic BP Percentile      Girls Diastolic BP Percentile      Boys Systolic BP Percentile      Boys Diastolic BP Percentile      Pulse Rate (!) 102     Resp 18     Temp 98.7 F (37.1 C)     Temp Source Oral     SpO2 96 %     Weight 120 lb (54.4 kg)     Height 5' 2 (1.575 m)     Head Circumference      Peak Flow      Pain Score 5     Pain Loc      Pain Education      Exclude from Growth Chart    No data found.  Updated Vital Signs BP  113/74 (BP Location: Right Arm)   Pulse (!) 102   Temp 98.7 F (37.1 C) (Oral)   Resp 18   Ht 5' 2 (1.575 m)   Wt 120 lb (54.4 kg)   LMP 04/10/2024 (Exact Date)   SpO2 96%   BMI 21.95 kg/m   Visual Acuity Right Eye Distance:   Left Eye Distance:   Bilateral Distance:    Right Eye Near:   Left Eye Near:    Bilateral Near:     Physical Exam Vitals reviewed.  Constitutional:      General: She is awake. She is not in acute distress.    Appearance: Normal appearance. She is well-developed and well-groomed. She is not ill-appearing or toxic-appearing.  HENT:     Head: Normocephalic and atraumatic.     Right Ear: Hearing, tympanic membrane and ear canal normal.     Left Ear: Hearing, tympanic membrane and ear canal normal.     Mouth/Throat:     Lips: Pink.     Mouth: Mucous membranes are moist.     Pharynx: Oropharynx is clear. Uvula midline. Posterior oropharyngeal erythema present. No pharyngeal swelling, oropharyngeal exudate, uvula swelling or postnasal drip.     Tonsils: No tonsillar exudate or tonsillar abscesses. 1+ on the right. 1+ on the left.  Cardiovascular:     Rate and Rhythm: Regular rhythm. Tachycardia present.     Pulses: Normal pulses.          Radial pulses are 2+ on the right side and 2+ on the left side.     Heart sounds: Normal heart sounds. No murmur heard.    No friction rub. No gallop.  Pulmonary:     Effort: Pulmonary effort is normal.     Breath sounds: Normal breath sounds. No decreased air  movement. No decreased breath sounds, wheezing, rhonchi or rales.  Musculoskeletal:     Cervical back: Normal range of motion and neck supple.  Lymphadenopathy:     Head:     Right side of head: No submental, submandibular or preauricular adenopathy.     Left side of head: No submental, submandibular or preauricular adenopathy.     Cervical:     Right cervical: No superficial cervical adenopathy.    Left cervical: No superficial cervical adenopathy.     Upper Body:     Right upper body: No supraclavicular adenopathy.     Left upper body: No supraclavicular adenopathy.  Skin:    General: Skin is warm and dry.  Neurological:     Mental Status: She is alert.  Psychiatric:        Mood and Affect: Mood normal.        Behavior: Behavior normal. Behavior is cooperative.      UC Treatments / Results  Labs (all labs ordered are listed, but only abnormal results are displayed) Labs Reviewed  CULTURE, GROUP A STREP [facility])  POCT RAPID STREP A (OFFICE)  POC SOFIA SARS ANTIGEN FIA    EKG   Radiology No results found.  Procedures Procedures (including critical care time)  Medications Ordered in UC Medications - No data to display  Initial Impression / Assessment and Plan / UC Course  I have reviewed the triage vital signs and the nursing notes.  Pertinent labs & imaging results that were available during my care of the patient were reviewed by me and considered in my medical decision making (see chart for details).      Final Clinical Impressions(s) /  UC Diagnoses   Final diagnoses:  Sore throat  Viral upper respiratory tract infection   Visit with patient indicates symptoms comprised of sore throat, productive coughing, nasal congestion, ear pain, fatigue since yesterday  congruent with acute URI that is likely viral in nature  Patient has tested negative for COVID and strep in clinic today. Strep culture ordered for definitive rule out due to pharyngeal erythema and  mild tonsillar swelling  Due to nature and duration of symptoms recommended treatment regimen is symptomatic relief and follow up if needed. Will send in script for Promethazine -DM cough syrup to assist with coughing and nausea. Reviewed side effects and safety precautions. Discussed with patient the various viral and bacterial etiologies of current illness and appropriate course of treatment Discussed OTC medication options for multisymptom relief such as Dayquil/Nyquil, Theraflu, AlkaSeltzer, etc. ED and return precautions reviewed and provided in after visit summary.  Follow-up as needed for progressing or persistent symptoms     Discharge Instructions      Based on your described symptoms and the duration of symptoms it is likely that you have a viral upper respiratory infection (often called a cold)  Symptoms can last for 3-10 days with lingering cough and intermittent symptoms potentially  lasting several  weeks after that.  The goal of treatment at this time is to reduce your symptoms and discomfort   You can use the following medications and measures to help yourself feel better until your body fights this off: DayQuil/NyQuil, TheraFlu, Alka-Seltzer  (these medications typically have the same active ingredients in them so you can choose whichever one you prefer and take consistently during the day and night according to the manufactures instructions.) Flonase  A daily antihistamine such as Zyrtec, Claritin , Allegra per your preference.  Please choose 1 and take consistently. Increased fluids.  It is recommended that you take in at least 64 ounces of water per day when you are not sick so it is important to increase this when you are sick and your body may be running fever. Rest Cough drops Chloraseptic throat spray to help with sore throat Nasal saline spray or nasal flushes to help with congestion and runny nose  I have sent in a cough syrup called promethazine -dextromethorphan  for you to take as needed up to 4 times per day.  Please be advised that this can cause drowsiness and sedation so do not use it if you need to remain alert or drive. this medication should help with the nausea and coughing.  If your symptoms seem like they are getting worse over the next 5 to 7 days or not improving you can always follow-up here in urgent care or go to your primary care provider for further management. Go to the ER if you begin to have more serious symptoms such as shortness of breath, trouble breathing, loss of consciousness, swelling around the eyes, high fever, severe lasting headaches, vision changes or neck pain/stiffness.       ED Prescriptions     Medication Sig Dispense Auth. Provider   promethazine -dextromethorphan (PROMETHAZINE -DM) 6.25-15 MG/5ML syrup Take 5 mLs by mouth 4 (four) times daily as needed. 118 mL Taira Knabe E, PA-C      PDMP not reviewed this encounter.   Marylene Rocky BRAVO, PA-C 04/14/24 1257    Adreyan Carbajal E, PA-C 04/14/24 1258

## 2024-04-14 NOTE — Discharge Instructions (Addendum)
 Based on your described symptoms and the duration of symptoms it is likely that you have a viral upper respiratory infection (often called a cold)  Symptoms can last for 3-10 days with lingering cough and intermittent symptoms potentially  lasting several  weeks after that.  The goal of treatment at this time is to reduce your symptoms and discomfort   You can use the following medications and measures to help yourself feel better until your body fights this off: DayQuil/NyQuil, TheraFlu, Alka-Seltzer  (these medications typically have the same active ingredients in them so you can choose whichever one you prefer and take consistently during the day and night according to the manufactures instructions.) Flonase  A daily antihistamine such as Zyrtec, Claritin , Allegra per your preference.  Please choose 1 and take consistently. Increased fluids.  It is recommended that you take in at least 64 ounces of water per day when you are not sick so it is important to increase this when you are sick and your body may be running fever. Rest Cough drops Chloraseptic throat spray to help with sore throat Nasal saline spray or nasal flushes to help with congestion and runny nose  I have sent in a cough syrup called promethazine -dextromethorphan for you to take as needed up to 4 times per day.  Please be advised that this can cause drowsiness and sedation so do not use it if you need to remain alert or drive. this medication should help with the nausea and coughing.  If your symptoms seem like they are getting worse over the next 5 to 7 days or not improving you can always follow-up here in urgent care or go to your primary care provider for further management. Go to the ER if you begin to have more serious symptoms such as shortness of breath, trouble breathing, loss of consciousness, swelling around the eyes, high fever, severe lasting headaches, vision changes or neck pain/stiffness.

## 2024-04-14 NOTE — ED Triage Notes (Addendum)
 Pt presents with complaints of non-productive cough, sore throat, congestion, chills, headaches, and wheezing in chest. States the symptoms began yesterday, 8/4. Currently rates overall throat pain a 5/10. Denies taking OTC medications for symptoms. Hx of asthma. Pt reports she becomes a little SOB after coughing spells. Denies fevers and sick contacts.

## 2024-04-17 ENCOUNTER — Ambulatory Visit (HOSPITAL_COMMUNITY): Payer: Self-pay

## 2024-04-17 LAB — CULTURE, GROUP A STREP (THRC): Special Requests: NORMAL

## 2024-05-23 ENCOUNTER — Ambulatory Visit: Payer: Self-pay | Admitting: Urgent Care

## 2024-05-23 ENCOUNTER — Ambulatory Visit
Admission: EM | Admit: 2024-05-23 | Discharge: 2024-05-23 | Disposition: A | Discharge status: Home / Self Care | Attending: Family Medicine | Admitting: Family Medicine

## 2024-05-23 ENCOUNTER — Ambulatory Visit

## 2024-05-23 DIAGNOSIS — Z23 Encounter for immunization: Secondary | ICD-10-CM | POA: Diagnosis not present

## 2024-05-23 DIAGNOSIS — M25532 Pain in left wrist: Secondary | ICD-10-CM

## 2024-05-23 DIAGNOSIS — M79642 Pain in left hand: Secondary | ICD-10-CM

## 2024-05-23 DIAGNOSIS — S60222A Contusion of left hand, initial encounter: Secondary | ICD-10-CM | POA: Diagnosis not present

## 2024-05-23 DIAGNOSIS — T07XXXA Unspecified multiple injuries, initial encounter: Secondary | ICD-10-CM | POA: Diagnosis not present

## 2024-05-23 MED ORDER — BACITRACIN ZINC 500 UNIT/GM EX OINT: 1.0000 | TOPICAL_OINTMENT | Freq: Two times a day (BID) | CUTANEOUS | 0 refills | Status: DC

## 2024-05-23 MED ORDER — NAPROXEN 500 MG PO TABS
500.0000 mg | ORAL_TABLET | Freq: Two times a day (BID) | ORAL | 0 refills | Status: DC
Start: 2024-05-23 — End: 2024-06-16

## 2024-05-23 MED ORDER — TETANUS-DIPHTH-ACELL PERTUSSIS 5-2.5-18.5 LF-MCG/0.5 IM SUSY
0.5000 mL | PREFILLED_SYRINGE | Freq: Once | INTRAMUSCULAR | Status: AC
  Administered 2024-05-23: 0.5 mL via INTRAMUSCULAR

## 2024-05-23 NOTE — Discharge Instructions (Addendum)
 Will call with your x-ray results later today. Use naproxen  for pain and inflammation. Change your dressing 2 times daily. Every time you change your dressing, clean the wound gently with warm water and Dial antibacterial soap. Pat the wound dry, let it breathe for roughly an hour before covering it back up. When you reapply a dressing, apply Bacitracin  ointment to the wound, then cover with non-stick/non-adherent gauze.  After 4-7 days, the wound should scab over nicely and then you don't have to continue doing dressings.

## 2024-05-23 NOTE — ED Provider Notes (Signed)
 [facility]  Note:  This document was prepared using Conservation officer, historic buildings and may include unintentional dictation errors.  MRN: [id number] DOB: 2001/01/01  Subjective:   Alexandra Young is a 23 y.o. female presenting for suffering a left wrist injury today while walking her dog.  Patient reports that she accidentally fell and broke her fall using her outstretched wrist and hand.  Made impact against concrete and walks.  Has had some swelling of the dorsal aspect of the hand and popping the knuckles laterally.  Cannot recall last Tdap.  No current facility-administered medications for this encounter.  Current Outpatient Medications:    albuterol  (VENTOLIN  HFA) 108 (90 Base) MCG/ACT inhaler, Inhale 1-2 puffs into the lungs every 6 (six) hours as needed for wheezing or shortness of breath., Disp: 6.7 g, Rfl: 0   azelastine (ASTELIN) 0.1 % nasal spray, Place into the nose., Disp: , Rfl:    busPIRone  (BUSPAR ) 10 MG tablet, Take 10 mg by mouth 2 (two) times daily. (Patient not taking: Reported on 11/06/2023), Disp: , Rfl:    butalbital-acetaminophen -caffeine (FIORICET) 50-325-40 MG tablet, Take 1 tablet by mouth every 6 (six) hours as needed for migraine., Disp: , Rfl:    cetirizine (ZYRTEC) 10 MG tablet, Take 10 mg by mouth 2 (two) times daily., Disp: , Rfl:    cyclobenzaprine  (FLEXERIL ) 10 MG tablet, Take 1 tablet (10 mg total) by mouth 2 (two) times daily as needed for muscle spasms., Disp: 20 tablet, Rfl: 0   etonogestrel (NEXPLANON) 68 MG IMPL implant, 68 mg by Subdermal route continuous. (Patient not taking: Reported on 11/06/2023), Disp: , Rfl:    fluticasone  (FLONASE ) 50 MCG/ACT nasal spray, Place 2 sprays into both nostrils daily as needed for allergies., Disp: , Rfl:    Galcanezumab-gnlm (EMGALITY) 120 MG/ML SOSY, Inject into the skin., Disp: , Rfl:    linaclotide  (LINZESS ) 145 MCG CAPS capsule, Take 145 mcg by mouth daily as needed (constipation).  (Patient not taking: Reported on 11/06/2023), Disp: , Rfl:    montelukast  (SINGULAIR ) 10 MG tablet, Take 10 mg by mouth at bedtime. (Patient not taking: Reported on 11/06/2023), Disp: , Rfl:    omeprazole (PRILOSEC) 40 MG capsule, 40 mg per 1 capsule, ORAL, Daily, 30 minutes before breakfast, Disp: , Rfl:    ondansetron  (ZOFRAN  ODT) 4 MG disintegrating tablet, Take 1 tablet (4 mg total) by mouth every 8 (eight) hours as needed for nausea or vomiting., Disp: 20 tablet, Rfl: 0   promethazine -dextromethorphan (PROMETHAZINE -DM) 6.25-15 MG/5ML syrup, Take 5 mLs by mouth 4 (four) times daily as needed., Disp: 118 mL, Rfl: 0   triamcinolone  cream (KENALOG ) 0.1 %, Apply 1 application topically 2 (two) times daily. Apply to affected areas from neck down (Patient not taking: Reported on 11/06/2023), Disp: 80 g, Rfl: 2   Vitamin D, Ergocalciferol, (DRISDOL) 1.25 MG (50000 UNIT) CAPS capsule, Take 50,000 Units by mouth every 7 (seven) days., Disp: , Rfl:    Allergies  Allergen Reactions   Lamotrigine  Other (See Comments) and Rash    Abdominal pain, liver function affected, gallbladder affected, heart valve issue, full body rash per pt from medication.  Caused DRESS SYNDROME  Abdominal pain, liver function affected, gallbladder affected, heart valve issue, full body rash per pt from medication.    Caused DRESS SYNDROME    Past Medical History:  Diagnosis Date   Anxiety    Asthma    Depression    DRESS syndrome 01/12/2021   GERD (gastroesophageal  reflux disease)    IBS (irritable bowel syndrome)    Migraine    per pt     Past Surgical History:  Procedure Laterality Date   BONE MARROW BIOPSY     SUBTALAR COALITION EXCISION Left 06/21/2022   Procedure: Left foot calcaneonavicular coalition excision;  Surgeon: Kit Rush, MD;  Location: [facility];  Service: Orthopedics;  Laterality: Left;   TEE WITHOUT CARDIOVERSION N/A 01/12/2021   Procedure: TRANSESOPHAGEAL ECHOCARDIOGRAM (TEE);   Surgeon: Jeffrie Oneil BROCKS, MD;  Location: [facility];  Service: Cardiovascular;  Laterality: N/A;    Family History  Problem Relation Age of Onset   Asthma Mother    Thyroid disease Mother    Cancer Mother    Diabetes Mother    Diabetes Father    Migraines Father    Depression Sister     Social History   Tobacco Use   Smoking status: Never   Smokeless tobacco: Never  Vaping Use   Vaping status: Never Used  Substance Use Topics   Alcohol use: Yes    Comment: socially   Drug use: Yes    Types: Marijuana    Comment: edibles occasionally    ROS   Objective:   Vitals: BP 115/80 (BP Location: Right Arm)   Pulse 75   Temp 99.1 F (37.3 C) (Oral)   Resp 16   SpO2 98%   Physical Exam Constitutional:      General: She is not in acute distress.    Appearance: Normal appearance. She is well-developed. She is not ill-appearing, toxic-appearing or diaphoretic.  HENT:     Head: Normocephalic and atraumatic.     Nose: Nose normal.     Mouth/Throat:     Mouth: Mucous membranes are moist.  Eyes:     General: No scleral icterus.       Right eye: No discharge.        Left eye: No discharge.     Extraocular Movements: Extraocular movements intact.  Cardiovascular:     Rate and Rhythm: Normal rate.  Pulmonary:     Effort: Pulmonary effort is normal.  Musculoskeletal:       Hands:     Comments: Tenderness throughout the dorsal ulnar aspect of the left hand and wrist.  Skin:    General: Skin is warm and dry.  Neurological:     General: No focal deficit present.     Mental Status: She is alert and oriented to person, place, and time.  Psychiatric:        Mood and Affect: Mood normal.        Behavior: Behavior normal.    Wounds cleansed and dressed.  DG Wrist Complete Left Result Date: 05/23/2024 CLINICAL DATA:  Left wrist pain EXAM: LEFT WRIST - COMPLETE 3+ VIEW COMPARISON:  None Available. FINDINGS: There is no evidence of fracture or dislocation. There is no  evidence of arthropathy or other focal bone abnormality. Soft tissues are unremarkable. IMPRESSION: Negative. Electronically Signed   By: Ryan Salvage M.D.   On: 05/23/2024 13:13   Assessment and Plan :   PDMP not reviewed this encounter.  1. Acute pain of left wrist   2. Left hand pain   3. Contusion of dorsum of left hand   4. Multiple abrasions   5. Need for diphtheria-tetanus-pertussis (Tdap) vaccine    Wound care reviewed.  Tdap updated in clinic.  Use naproxen  for pain and inflammation.  Recommend general RICE method.  Counseled patient  on potential for adverse effects with medications prescribed/recommended today, ER and return-to-clinic precautions discussed, patient verbalized understanding.    Christopher Savannah, PA-C 05/23/24 1329

## 2024-05-23 NOTE — ED Triage Notes (Signed)
 Pt states she fell and this morning onto concrete and rocks while walking her dog.  C/O left wrist pain. Pt has abrasions to left wrist with slight swelling.  N/V intact.

## 2024-06-16 ENCOUNTER — Other Ambulatory Visit: Payer: Self-pay

## 2024-06-16 ENCOUNTER — Emergency Department (HOSPITAL_COMMUNITY)
Admission: EM | Admit: 2024-06-16 | Discharge: 2024-06-16 | Disposition: A | Discharge status: Home / Self Care | Attending: Emergency Medicine | Admitting: Emergency Medicine

## 2024-06-16 ENCOUNTER — Emergency Department (HOSPITAL_COMMUNITY)

## 2024-06-16 ENCOUNTER — Encounter (HOSPITAL_COMMUNITY): Payer: Self-pay

## 2024-06-16 DIAGNOSIS — R1114 Bilious vomiting: Secondary | ICD-10-CM | POA: Diagnosis not present

## 2024-06-16 DIAGNOSIS — R1013 Epigastric pain: Secondary | ICD-10-CM | POA: Insufficient documentation

## 2024-06-16 DIAGNOSIS — R1012 Left upper quadrant pain: Secondary | ICD-10-CM | POA: Insufficient documentation

## 2024-06-16 DIAGNOSIS — E872 Acidosis, unspecified: Secondary | ICD-10-CM | POA: Insufficient documentation

## 2024-06-16 DIAGNOSIS — R112 Nausea with vomiting, unspecified: Secondary | ICD-10-CM | POA: Diagnosis present

## 2024-06-16 DIAGNOSIS — E8729 Other acidosis: Secondary | ICD-10-CM

## 2024-06-16 LAB — LIPASE, BLOOD: Lipase: 26 U/L (ref 11–51)

## 2024-06-16 LAB — CBC WITH DIFFERENTIAL/PLATELET
Abs Immature Granulocytes: 0.09 K/uL — ABNORMAL HIGH (ref 0.00–0.07)
Basophils Absolute: 0.1 K/uL (ref 0.0–0.1)
Basophils Relative: 0 %
Eosinophils Absolute: 0.4 K/uL (ref 0.0–0.5)
Eosinophils Relative: 3 %
HCT: 43.8 % (ref 36.0–46.0)
Hemoglobin: 14.5 g/dL (ref 12.0–15.0)
Immature Granulocytes: 1 %
Lymphocytes Relative: 24 %
Lymphs Abs: 3.3 K/uL (ref 0.7–4.0)
MCH: 29.5 pg (ref 26.0–34.0)
MCHC: 33.1 g/dL (ref 30.0–36.0)
MCV: 89 fL (ref 80.0–100.0)
Monocytes Absolute: 0.7 K/uL (ref 0.1–1.0)
Monocytes Relative: 5 %
Neutro Abs: 9.5 K/uL — ABNORMAL HIGH (ref 1.7–7.7)
Neutrophils Relative %: 67 %
Platelets: 329 K/uL (ref 150–400)
RBC: 4.92 MIL/uL (ref 3.87–5.11)
RDW: 13.2 % (ref 11.5–15.5)
WBC: 14.2 K/uL — ABNORMAL HIGH (ref 4.0–10.5)
nRBC: 0 % (ref 0.0–0.2)

## 2024-06-16 LAB — COMPREHENSIVE METABOLIC PANEL WITH GFR
ALT: 24 U/L (ref 0–44)
AST: 28 U/L (ref 15–41)
Albumin: 5 g/dL (ref 3.5–5.0)
Alkaline Phosphatase: 30 U/L — ABNORMAL LOW (ref 38–126)
Anion gap: 23 — ABNORMAL HIGH (ref 5–15)
BUN: 12 mg/dL (ref 6–20)
CO2: 13 mmol/L — ABNORMAL LOW (ref 22–32)
Calcium: 9.7 mg/dL (ref 8.9–10.3)
Chloride: 100 mmol/L (ref 98–111)
Creatinine, Ser: 0.7 mg/dL (ref 0.44–1.00)
GFR, Estimated: >60 mL/min (ref >60)
Glucose, Bld: 71 mg/dL (ref 70–99)
Potassium: 3.8 mmol/L (ref 3.5–5.1)
Sodium: 136 mmol/L (ref 135–145)
Total Bilirubin: 0.9 mg/dL (ref 0.0–1.2)
Total Protein: 8.3 g/dL — ABNORMAL HIGH (ref 6.5–8.1)

## 2024-06-16 LAB — URINALYSIS, ROUTINE W REFLEX MICROSCOPIC
Bilirubin Urine: NEGATIVE
Glucose, UA: NEGATIVE mg/dL
Hgb urine dipstick: NEGATIVE
Ketones, ur: 80 mg/dL — AB
Leukocytes,Ua: NEGATIVE
Nitrite: NEGATIVE
Protein, ur: NEGATIVE mg/dL
Specific Gravity, Urine: 1.021 (ref 1.005–1.030)
pH: 5 (ref 5.0–8.0)

## 2024-06-16 LAB — HCG, SERUM, QUALITATIVE: Preg, Serum: NEGATIVE

## 2024-06-16 LAB — ETHANOL: Alcohol, Ethyl (B): <15 mg/dL (ref <15)

## 2024-06-16 MED ORDER — METOCLOPRAMIDE HCL 5 MG/ML IJ SOLN
10.0000 mg | Freq: Once | INTRAMUSCULAR | Status: AC
Start: 2024-06-16 — End: 2024-06-16
  Administered 2024-06-16: 10 mg via INTRAVENOUS
  Filled 2024-06-16: qty 2

## 2024-06-16 MED ORDER — ONDANSETRON HCL 4 MG/2ML IJ SOLN
4.0000 mg | Freq: Once | INTRAMUSCULAR | Status: AC
Start: 2024-06-16 — End: 2024-06-16
  Administered 2024-06-16: 4 mg via INTRAVENOUS
  Filled 2024-06-16: qty 2

## 2024-06-16 MED ORDER — SODIUM CHLORIDE 0.9 % IV BOLUS
1000.0000 mL | Freq: Once | INTRAVENOUS | Status: AC
  Administered 2024-06-16: 1000 mL via INTRAVENOUS

## 2024-06-16 MED ORDER — KETOROLAC TROMETHAMINE 15 MG/ML IJ SOLN
15.0000 mg | Freq: Once | INTRAMUSCULAR | Status: AC
  Administered 2024-06-16: 15 mg via INTRAVENOUS
  Filled 2024-06-16: qty 1

## 2024-06-16 MED ORDER — FAMOTIDINE IN NACL 20-0.9 MG/50ML-% IV SOLN
20.0000 mg | Freq: Once | INTRAVENOUS | Status: AC
  Administered 2024-06-16: 20 mg via INTRAVENOUS
  Filled 2024-06-16: qty 50

## 2024-06-16 MED ORDER — ONDANSETRON 4 MG PO TBDP: 4.0000 mg | ORAL_TABLET | Freq: Three times a day (TID) | ORAL | 0 refills | Status: AC | PRN

## 2024-06-16 NOTE — ED Provider Notes (Signed)
 [place] EMERGENCY DEPARTMENT AT [facility] Provider Note   CSN: [id number] Arrival date & time: 06/16/24  1128     Patient presents with: Abdominal Pain   Alexandra Young is a 23 y.o. female.   HPI Patient presents with nausea, vomiting, headache.  She has a history of headaches, this is similar characteristic to prior, but nausea, vomiting, weakness is new.  She smokes marijuana, drinks alcohol, is generally well, was until about 2 days ago aside from mild headache.  Now, since onset symptoms have been persistent with pain in the upper abdomen.    Prior to Admission medications   Medication Sig Start Date End Date Taking? Authorizing Provider  albuterol  (VENTOLIN  HFA) 108 (90 Base) MCG/ACT inhaler Inhale 1-2 puffs into the lungs every 6 (six) hours as needed for wheezing or shortness of breath. 10/08/22   Lamptey, Aleene KIDD, MD  azelastine (ASTELIN) 0.1 % nasal spray Place into the nose. 01/17/22   [provider]  bacitracin  ointment Apply 1 Application topically 2 (two) times daily. 05/23/24   Christopher Savannah, PA-C  busPIRone  (BUSPAR ) 10 MG tablet Take 10 mg by mouth 2 (two) times daily. Patient not taking: Reported on 11/06/2023    [provider]  butalbital-acetaminophen -caffeine (FIORICET) 50-325-40 MG tablet Take 1 tablet by mouth every 6 (six) hours as needed for migraine. 07/02/19   [provider]  cetirizine (ZYRTEC) 10 MG tablet Take 10 mg by mouth 2 (two) times daily.    [provider]  cyclobenzaprine  (FLEXERIL ) 10 MG tablet Take 1 tablet (10 mg total) by mouth 2 (two) times daily as needed for muscle spasms. 11/06/23   Billy Asberry FALCON, PA-C  etonogestrel (NEXPLANON) 68 MG IMPL implant 68 mg by Subdermal route continuous. Patient not taking: Reported on 11/06/2023    [provider]  fluticasone  (FLONASE ) 50 MCG/ACT nasal spray Place 2 sprays into both nostrils daily as needed for allergies.    [provider]   Galcanezumab-gnlm (EMGALITY) 120 MG/ML SOSY Inject into the skin. 09/02/23   [provider]  linaclotide  (LINZESS ) 145 MCG CAPS capsule Take 145 mcg by mouth daily as needed (constipation). Patient not taking: Reported on 11/06/2023    [provider]  montelukast  (SINGULAIR ) 10 MG tablet Take 10 mg by mouth at bedtime. Patient not taking: Reported on 11/06/2023 11/06/19   [provider]  naproxen  (NAPROSYN ) 500 MG tablet Take 1 tablet (500 mg total) by mouth 2 (two) times daily with a meal. 05/23/24   Christopher Savannah, PA-C  omeprazole (PRILOSEC) 40 MG capsule 40 mg per 1 capsule, ORAL, Daily, 30 minutes before breakfast 09/02/23   [provider]  ondansetron  (ZOFRAN  ODT) 4 MG disintegrating tablet Take 1 tablet (4 mg total) by mouth every 8 (eight) hours as needed for nausea or vomiting. 05/19/21   Shepard, Margaux, PA-C  promethazine -dextromethorphan (PROMETHAZINE -DM) 6.25-15 MG/5ML syrup Take 5 mLs by mouth 4 (four) times daily as needed. 04/14/24   Mecum, Erin E, PA-C  triamcinolone  cream (KENALOG ) 0.1 % Apply 1 application topically 2 (two) times daily. Apply to affected areas from neck down Patient not taking: Reported on 11/06/2023 01/21/21   Jonel Lonni SQUIBB, MD  Vitamin D, Ergocalciferol, (DRISDOL) 1.25 MG (50000 UNIT) CAPS capsule Take 50,000 Units by mouth every 7 (seven) days.    [provider]  medroxyPROGESTERone (DEPO-PROVERA) 150 MG/ML injection Inject 150 mg into the muscle every 3 (three) months.  05/20/20  [provider]    Allergies:  Lamotrigine     Review of Systems  Updated Vital Signs BP 133/80 (BP Location: Right Arm)   Pulse 92   Temp 98.8 F (37.1 C) (Oral)   Resp 18   SpO2 99%   Physical Exam Vitals and nursing note reviewed.  Constitutional:      General: She is not in acute distress.    Appearance: She is well-developed.  HENT:     Head: Normocephalic and atraumatic.  Eyes:     Conjunctiva/sclera:  Conjunctivae normal.  Cardiovascular:     Rate and Rhythm: Normal rate and regular rhythm.  Pulmonary:     Effort: Pulmonary effort is normal. No respiratory distress.     Breath sounds: Normal breath sounds. No stridor.  Abdominal:     General: There is no distension.     Tenderness: There is abdominal tenderness in the epigastric area and left upper quadrant.  Skin:    General: Skin is warm and dry.  Neurological:     Mental Status: She is alert and oriented to person, place, and time.     Cranial Nerves: No cranial nerve deficit.  Psychiatric:        Mood and Affect: Mood normal.     (all labs ordered are listed, but only abnormal results are displayed) Labs Reviewed  CBC WITH DIFFERENTIAL/PLATELET - Abnormal; Notable for the following components:      Result Value   WBC 14.2 (*)    Neutro Abs 9.5 (*)    Abs Immature Granulocytes 0.09 (*)    All other components within normal limits  COMPREHENSIVE METABOLIC PANEL WITH GFR  LIPASE, BLOOD  URINALYSIS, ROUTINE W REFLEX MICROSCOPIC  HCG, SERUM, QUALITATIVE  ETHANOL    EKG: None  Radiology: No results found.   Procedures   Medications Ordered in the ED  sodium chloride  0.9 % bolus 1,000 mL (has no administration in time range)  ondansetron  (ZOFRAN ) injection 4 mg (has no administration in time range)  famotidine (PEPCID) IVPB 20 mg premix (has no administration in time range)  metoCLOPramide  (REGLAN ) injection 10 mg (has no administration in time range)  ketorolac  (TORADOL ) 15 MG/ML injection 15 mg (has no administration in time range)                                    Medical Decision Making Patient presents with headache which is not unusual, but new abdominal pain, nausea, vomiting as well. She has a history of migraines, notes that over the past few days she has had right sided occipital pain, and a typical distribution for her.  She has subsequently developed upper abdominal pain, epigastric, left, with  associated anorexia, nausea, vomiting. No relief with anything.  Pulse ox 99% room air normal  Amount and/or Complexity of Data Reviewed Labs: ordered. Decision-making details documented in ED Course. Radiology: ordered.  Risk Prescription drug management. Decision regarding hospitalization. Diagnosis or treatment significantly limited by social determinants of health.   3:48 PM After 2 L fluid resuscitation, Zofran , Toradol , Reglan , patient is feeling better, heart rate now 100.  Ultrasound reviewed, no acute cholecystitis or other substantial findings. Patient's initial labs are notable for ketone urea, which she has had previously and consistent with initial concerns for dehydration.  She did have anion gap, likely secondary to nausea, vomiting, given reassuring glucose level, absence of other evidence/cause for gap acidosis. Now with substantial improvement, after conversation on all results  patient discharged in stable condition.    Final diagnoses:  Bilious vomiting with nausea  High anion gap metabolic acidosis    ED Discharge Orders     None          Garrick Charleston, MD 06/16/24 1551

## 2024-06-16 NOTE — Discharge Instructions (Signed)
 You have been diagnosed with dehydration and nausea with vomiting. Is very important to stay well-hydrated and follow-up with your physician.  Return here for concerning changes in your condition.

## 2024-06-16 NOTE — ED Triage Notes (Signed)
 Pt reports with abdominal pain and vomiting that started today. Pt has had diarrhea, and a cough over the past few weeks before this episode. Pt reports feeling weak.

## 2024-06-16 NOTE — ED Notes (Signed)
 Pt to ultrasound
